# Patient Record
Sex: Female | Born: 1935
Health system: Southern US, Community
[De-identification: ages and names within clinical notes are randomized; demographics above are authoritative.]

## PROBLEM LIST (undated history)

## (undated) DIAGNOSIS — M810 Age-related osteoporosis without current pathological fracture: Secondary | ICD-10-CM

## (undated) DIAGNOSIS — E785 Hyperlipidemia, unspecified: Secondary | ICD-10-CM

## (undated) DIAGNOSIS — I10 Essential (primary) hypertension: Secondary | ICD-10-CM

## (undated) DIAGNOSIS — N189 Chronic kidney disease, unspecified: Secondary | ICD-10-CM

## (undated) DIAGNOSIS — C801 Malignant (primary) neoplasm, unspecified: Secondary | ICD-10-CM

## (undated) DIAGNOSIS — I890 Lymphedema, not elsewhere classified: Secondary | ICD-10-CM

## (undated) DIAGNOSIS — M4800 Spinal stenosis, site unspecified: Secondary | ICD-10-CM

## (undated) DIAGNOSIS — M109 Gout, unspecified: Secondary | ICD-10-CM

## (undated) DIAGNOSIS — E119 Type 2 diabetes mellitus without complications: Secondary | ICD-10-CM

## (undated) DIAGNOSIS — M889 Osteitis deformans of unspecified bone: Secondary | ICD-10-CM

## (undated) DIAGNOSIS — E669 Obesity, unspecified: Secondary | ICD-10-CM

## (undated) HISTORY — DX: Essential (primary) hypertension: I10

## (undated) HISTORY — DX: Chronic kidney disease, unspecified: N18.9

## (undated) HISTORY — DX: Osteitis deformans of unspecified bone: M88.9

## (undated) HISTORY — DX: Gout, unspecified: M10.9

## (undated) HISTORY — DX: Age-related osteoporosis without current pathological fracture: M81.0

## (undated) HISTORY — DX: Hyperlipidemia, unspecified: E78.5

## (undated) HISTORY — DX: Type 2 diabetes mellitus without complications: E11.9

## (undated) HISTORY — PX: ABDOMINAL HYSTERECTOMY: SHX81

## (undated) HISTORY — DX: Lymphedema, not elsewhere classified: I89.0

## (undated) HISTORY — PX: JOINT REPLACEMENT: SHX530

## (undated) HISTORY — DX: Spinal stenosis, site unspecified: M48.00

## (undated) HISTORY — DX: Malignant (primary) neoplasm, unspecified: C80.1

---

## 2006-07-08 ENCOUNTER — Encounter: Payer: Self-pay | Admitting: Internal Medicine

## 2006-07-12 ENCOUNTER — Emergency Department: Payer: Self-pay

## 2006-07-20 ENCOUNTER — Encounter: Payer: Self-pay | Admitting: Internal Medicine

## 2006-08-19 ENCOUNTER — Encounter: Payer: Self-pay | Admitting: Internal Medicine

## 2006-09-17 ENCOUNTER — Ambulatory Visit: Payer: Self-pay | Admitting: Internal Medicine

## 2007-03-18 ENCOUNTER — Ambulatory Visit: Payer: Self-pay | Admitting: Rheumatology

## 2007-09-01 ENCOUNTER — Ambulatory Visit: Payer: Self-pay | Admitting: Nurse Practitioner

## 2007-09-28 ENCOUNTER — Ambulatory Visit: Payer: Self-pay | Admitting: Internal Medicine

## 2008-05-11 ENCOUNTER — Ambulatory Visit: Payer: Self-pay | Admitting: Nurse Practitioner

## 2008-11-19 HISTORY — PX: MASTECTOMY: SHX3

## 2008-11-22 ENCOUNTER — Ambulatory Visit: Payer: Self-pay | Admitting: Nurse Practitioner

## 2008-12-08 ENCOUNTER — Ambulatory Visit: Payer: Self-pay | Admitting: Nurse Practitioner

## 2008-12-20 ENCOUNTER — Ambulatory Visit: Payer: Self-pay | Admitting: Oncology

## 2009-01-13 ENCOUNTER — Ambulatory Visit: Payer: Self-pay | Admitting: Oncology

## 2009-01-17 ENCOUNTER — Ambulatory Visit: Payer: Self-pay | Admitting: Oncology

## 2009-02-09 ENCOUNTER — Ambulatory Visit: Payer: Self-pay | Admitting: Surgery

## 2009-02-15 ENCOUNTER — Inpatient Hospital Stay: Payer: Self-pay | Admitting: Surgery

## 2009-02-20 ENCOUNTER — Emergency Department: Payer: Self-pay | Admitting: Emergency Medicine

## 2009-02-24 ENCOUNTER — Emergency Department: Payer: Self-pay | Admitting: Emergency Medicine

## 2009-04-19 ENCOUNTER — Ambulatory Visit: Payer: Self-pay | Admitting: Oncology

## 2009-04-29 ENCOUNTER — Ambulatory Visit: Payer: Self-pay | Admitting: Oncology

## 2009-05-19 ENCOUNTER — Ambulatory Visit: Payer: Self-pay | Admitting: Oncology

## 2009-06-17 ENCOUNTER — Ambulatory Visit: Payer: Self-pay | Admitting: Oncology

## 2009-06-28 ENCOUNTER — Ambulatory Visit: Payer: Self-pay | Admitting: Oncology

## 2009-08-19 ENCOUNTER — Ambulatory Visit: Payer: Self-pay | Admitting: Oncology

## 2009-09-16 ENCOUNTER — Ambulatory Visit: Payer: Self-pay | Admitting: Oncology

## 2009-09-19 ENCOUNTER — Ambulatory Visit: Payer: Self-pay | Admitting: Oncology

## 2009-11-19 ENCOUNTER — Ambulatory Visit: Payer: Self-pay | Admitting: Oncology

## 2009-12-12 ENCOUNTER — Ambulatory Visit: Payer: Self-pay | Admitting: Oncology

## 2009-12-16 ENCOUNTER — Ambulatory Visit: Payer: Self-pay | Admitting: Oncology

## 2009-12-20 ENCOUNTER — Ambulatory Visit: Payer: Self-pay | Admitting: Oncology

## 2010-01-23 ENCOUNTER — Ambulatory Visit: Payer: Self-pay | Admitting: Unknown Physician Specialty

## 2010-02-09 ENCOUNTER — Ambulatory Visit: Payer: Self-pay | Admitting: Pain Medicine

## 2010-02-17 ENCOUNTER — Ambulatory Visit: Payer: Self-pay | Admitting: Oncology

## 2010-03-17 ENCOUNTER — Ambulatory Visit: Payer: Self-pay | Admitting: Oncology

## 2010-03-19 ENCOUNTER — Ambulatory Visit: Payer: Self-pay | Admitting: Oncology

## 2010-05-19 ENCOUNTER — Ambulatory Visit: Payer: Self-pay | Admitting: Oncology

## 2010-06-16 ENCOUNTER — Ambulatory Visit: Payer: Self-pay | Admitting: Oncology

## 2010-06-19 ENCOUNTER — Ambulatory Visit: Payer: Self-pay | Admitting: Oncology

## 2010-08-18 ENCOUNTER — Ambulatory Visit: Payer: Self-pay | Admitting: Internal Medicine

## 2010-10-04 ENCOUNTER — Ambulatory Visit: Payer: Self-pay | Admitting: Family Medicine

## 2010-10-06 ENCOUNTER — Ambulatory Visit: Payer: Self-pay | Admitting: Oncology

## 2010-10-07 LAB — CANCER ANTIGEN 27.29: CA 27.29: 11.1 U/mL (ref 0.0–38.6)

## 2010-10-16 ENCOUNTER — Ambulatory Visit: Payer: Self-pay | Admitting: Family Medicine

## 2010-10-19 ENCOUNTER — Ambulatory Visit: Payer: Self-pay | Admitting: Oncology

## 2010-12-19 ENCOUNTER — Ambulatory Visit: Payer: Self-pay | Admitting: Oncology

## 2011-03-12 ENCOUNTER — Ambulatory Visit: Payer: Self-pay | Admitting: Internal Medicine

## 2011-03-20 ENCOUNTER — Ambulatory Visit: Payer: Self-pay | Admitting: Internal Medicine

## 2011-03-20 LAB — CANCER ANTIGEN 27.29: CA 27.29: 20.8 U/mL (ref 0.0–38.6)

## 2011-06-14 DIAGNOSIS — M159 Polyosteoarthritis, unspecified: Secondary | ICD-10-CM | POA: Insufficient documentation

## 2011-06-14 DIAGNOSIS — I890 Lymphedema, not elsewhere classified: Secondary | ICD-10-CM | POA: Insufficient documentation

## 2011-06-14 DIAGNOSIS — Z853 Personal history of malignant neoplasm of breast: Secondary | ICD-10-CM | POA: Insufficient documentation

## 2011-06-14 DIAGNOSIS — M810 Age-related osteoporosis without current pathological fracture: Secondary | ICD-10-CM | POA: Insufficient documentation

## 2011-06-14 DIAGNOSIS — M1990 Unspecified osteoarthritis, unspecified site: Secondary | ICD-10-CM | POA: Insufficient documentation

## 2011-10-08 ENCOUNTER — Ambulatory Visit: Payer: Self-pay | Admitting: Internal Medicine

## 2011-10-09 LAB — CANCER ANTIGEN 27.29: CA 27.29: 14.1 U/mL (ref 0.0–38.6)

## 2011-10-20 ENCOUNTER — Ambulatory Visit: Payer: Self-pay | Admitting: Internal Medicine

## 2011-12-12 ENCOUNTER — Ambulatory Visit: Payer: Self-pay | Admitting: Internal Medicine

## 2011-12-25 ENCOUNTER — Ambulatory Visit: Payer: Self-pay | Admitting: Oncology

## 2012-04-10 ENCOUNTER — Ambulatory Visit: Payer: Self-pay | Admitting: Internal Medicine

## 2012-04-10 LAB — CBC CANCER CENTER
Eosinophil %: 4.1 %
HCT: 36.1 % (ref 35.0–47.0)
HGB: 11.7 g/dL — ABNORMAL LOW (ref 12.0–16.0)
Lymphocyte #: 1.4 x10 3/mm (ref 1.0–3.6)
MCV: 84 fL (ref 80–100)
Monocyte #: 0.3 x10 3/mm (ref 0.2–0.9)
Neutrophil %: 54.6 %
RBC: 4.29 10*6/uL (ref 3.80–5.20)
RDW: 13.6 % (ref 11.5–14.5)

## 2012-04-10 LAB — COMPREHENSIVE METABOLIC PANEL
Anion Gap: 9 (ref 7–16)
Bilirubin,Total: 0.4 mg/dL (ref 0.2–1.0)
Calcium, Total: 9.3 mg/dL (ref 8.5–10.1)
Chloride: 108 mmol/L — ABNORMAL HIGH (ref 98–107)
Co2: 25 mmol/L (ref 21–32)
Creatinine: 1.58 mg/dL — ABNORMAL HIGH (ref 0.60–1.30)
EGFR (African American): 35 — ABNORMAL LOW
EGFR (Non-African Amer.): 30 — ABNORMAL LOW
Osmolality: 295 (ref 275–301)
Potassium: 4.9 mmol/L (ref 3.5–5.1)
SGOT(AST): 16 U/L (ref 15–37)
SGPT (ALT): 22 U/L
Total Protein: 6.8 g/dL (ref 6.4–8.2)

## 2012-04-11 LAB — CANCER ANTIGEN 27.29: CA 27.29: 19.1 U/mL (ref 0.0–38.6)

## 2012-04-19 ENCOUNTER — Ambulatory Visit: Payer: Self-pay | Admitting: Internal Medicine

## 2012-06-02 DIAGNOSIS — E785 Hyperlipidemia, unspecified: Secondary | ICD-10-CM | POA: Insufficient documentation

## 2012-06-02 DIAGNOSIS — I10 Essential (primary) hypertension: Secondary | ICD-10-CM | POA: Insufficient documentation

## 2012-06-02 DIAGNOSIS — E1169 Type 2 diabetes mellitus with other specified complication: Secondary | ICD-10-CM

## 2013-03-19 ENCOUNTER — Ambulatory Visit: Payer: Self-pay | Admitting: Family Medicine

## 2013-04-10 ENCOUNTER — Ambulatory Visit: Payer: Self-pay | Admitting: Family Medicine

## 2013-07-30 ENCOUNTER — Ambulatory Visit: Payer: Self-pay | Admitting: Family Medicine

## 2013-09-04 ENCOUNTER — Ambulatory Visit: Payer: Self-pay | Admitting: Family Medicine

## 2014-07-19 ENCOUNTER — Ambulatory Visit: Payer: Self-pay | Admitting: Nurse Practitioner

## 2014-07-19 LAB — CREATININE, SERUM
CREATININE: 1.35 mg/dL — AB (ref 0.60–1.30)
EGFR (African American): 42 — ABNORMAL LOW
GFR CALC NON AF AMER: 36 — AB

## 2014-07-29 ENCOUNTER — Ambulatory Visit: Payer: Self-pay | Admitting: Nurse Practitioner

## 2015-03-14 ENCOUNTER — Emergency Department
Admit: 2015-03-14 | Disposition: A | Discharge status: Home / Self Care | Payer: Self-pay | Admitting: Emergency Medicine

## 2015-03-14 LAB — CBC WITH DIFFERENTIAL/PLATELET
Basophil #: 0 10*3/uL (ref 0.0–0.1)
Basophil %: 0.7 %
Eosinophil #: 0.2 10*3/uL (ref 0.0–0.7)
Eosinophil %: 2.8 %
HCT: 39.6 % (ref 35.0–47.0)
HGB: 13 g/dL (ref 12.0–16.0)
Lymphocyte #: 2 10*3/uL (ref 1.0–3.6)
Lymphocyte %: 35.2 %
MCH: 26.7 pg (ref 26.0–34.0)
MCHC: 32.9 g/dL (ref 32.0–36.0)
MCV: 81 fL (ref 80–100)
MONO ABS: 0.4 x10 3/mm (ref 0.2–0.9)
MONOS PCT: 6.9 %
NEUTROS PCT: 54.4 %
Neutrophil #: 3 10*3/uL (ref 1.4–6.5)
PLATELETS: 158 10*3/uL (ref 150–440)
RBC: 4.86 10*6/uL (ref 3.80–5.20)
RDW: 13.3 % (ref 11.5–14.5)
WBC: 5.6 10*3/uL (ref 3.6–11.0)

## 2015-03-14 LAB — COMPREHENSIVE METABOLIC PANEL
ALK PHOS: 60 U/L
ALT: 12 U/L — AB
Albumin: 4.1 g/dL
Anion Gap: 6 — ABNORMAL LOW (ref 7–16)
BUN: 23 mg/dL — ABNORMAL HIGH
Bilirubin,Total: 0.3 mg/dL
CALCIUM: 9.3 mg/dL
CHLORIDE: 110 mmol/L
CO2: 26 mmol/L
CREATININE: 1.3 mg/dL — AB
EGFR (African American): 43 — ABNORMAL LOW
EGFR (Non-African Amer.): 37 — ABNORMAL LOW
GLUCOSE: 115 mg/dL — AB
Potassium: 4.2 mmol/L
SGOT(AST): 17 U/L
Sodium: 142 mmol/L
Total Protein: 7 g/dL

## 2015-03-14 LAB — PRO B NATRIURETIC PEPTIDE: B-TYPE NATIURETIC PEPTID: 98 pg/mL

## 2015-03-14 LAB — TROPONIN I

## 2015-07-05 ENCOUNTER — Encounter: Payer: Self-pay | Admitting: Physical Therapy

## 2015-07-05 ENCOUNTER — Ambulatory Visit: Payer: Medicare Other | Attending: Rheumatology | Admitting: Physical Therapy

## 2015-07-05 DIAGNOSIS — M545 Low back pain, unspecified: Secondary | ICD-10-CM

## 2015-07-05 DIAGNOSIS — M2560 Stiffness of unspecified joint, not elsewhere classified: Secondary | ICD-10-CM | POA: Diagnosis present

## 2015-07-05 DIAGNOSIS — M6281 Muscle weakness (generalized): Secondary | ICD-10-CM

## 2015-07-05 DIAGNOSIS — R262 Difficulty in walking, not elsewhere classified: Secondary | ICD-10-CM | POA: Insufficient documentation

## 2015-07-05 NOTE — Therapy (Signed)
[facility] [address] [place], [place], [address] Phone: [phone]   Fax:  [phone]  Physical Therapy Evaluation  Patient Details  Name: Heather Long MRN: [id number] Date of Birth: December 29, 1927 Referring Provider:  Emmaline Kluver.,*  Encounter Date: 07/05/2015      PT End of Session - 07/05/15 1348    Visit Number 1   Number of Visits 8   Date for PT Re-Evaluation 08/02/15   Authorization - Visit Number 1   Authorization - Number of Visits 10   PT Start Time 1017   PT Stop Time 1121   PT Time Calculation (min) 64 min   Equipment Utilized During Treatment Gait belt   Activity Tolerance Patient limited by pain;Patient limited by fatigue;Patient tolerated treatment well   Behavior During Therapy [facility] for tasks assessed/performed      Past Medical History  Diagnosis Date  . Diabetes mellitus without complication   . Hyperlipidemia   . Hypertension   . Chronic kidney disease   . Osteoporosis   . Paget's disease of bone   . Gout   . Spinal stenosis   . Cancer   . Lymphedema     Past Surgical History  Procedure Laterality Date  . Joint replacement  2000, 2006    There were no vitals filed for this visit.  Visit Diagnosis:  Nonspecific pain in the lumbar region  Joint stiffness of spine  Difficulty walking  Generalized muscle weakness      Subjective Assessment - 07/05/15 1319    Subjective Pt reports overall pain that feels like a "toothache" that has been constant for multiple years. Pt states that her low back hurts with lying down and standing.    Pertinent History B TKA 2000, 2006    Limitations Standing;Walking;House hold activities;Other (comment)  lying flat in bed   How long can you sit comfortably? in a soft chair/couch > 30 mins    How long can you stand comfortably? < 5 mins   How long can you walk comfortably? < 2 mins    Patient Stated Goals "stand upright" Walk with no AD. Knit  and crochet pain free.   Currently in Pain? Yes   Pain Score 3    Pain Location Generalized   Pain Descriptors / Indicators Aching   Pain Type Chronic pain   Pain Onset More than a month ago   Pain Frequency Constant            [facility] PT Assessment - 07/05/15 0001    Assessment   Medical Diagnosis Low back pain without sciatica   Onset Date/Surgical Date 11/19/14   Next MD Visit 08/15/15   Balance Screen   Has the patient fallen in the past 6 months Yes   Has the patient had a decrease in activity level because of a fear of falling?  Yes   Is the patient reluctant to leave their home because of a fear of falling?  Yes   [place] residence           PT Education - 07/05/15 1347    Education provided Yes   Education Details Straight leg raise, scapular retraction, pec stretch in supine, standing marching at the kitchen counter   Person(s) Educated Patient   Methods Explanation;Demonstration;Handout   Comprehension Verbalized understanding;Returned demonstration             PT Long Term Goals - 07/05/15 1402  PT LONG TERM GOAL #1   Title Pt will increase BERG balance score to > 48/56 in order to safely ambulate in her home with occasional use of SPC.   Baseline 07-16-15 38/56    Time 4   Period Weeks   Status New   PT LONG TERM GOAL #2   Title Pt will show a decrease in self-reported perceived impairments ODI to < 30% in order to return to ADLs.    Baseline 54% on Jul 16, 2015   Time 4   Period Weeks   Status New   PT LONG TERM GOAL #3   Title Pt I with HEP to increase B hip flexion to a 4/5 MMT in order to ambulate safely with a normalized gait pattern for community distances.    Baseline 3/5 MMT B on July 16, 2015   Time 4   Period Weeks   Status New   PT LONG TERM GOAL #4   Title Pt. will demonstrate proper upright standing posture/ body mechanics with cooking/household chores to improve overall mobility.   Baseline Poor  posture with forward head/ rounded shoulders   Time 4   Period Weeks   Status New            Plan - 2015-07-16 1349    Clinical Impression Statement Pt is a 81 y.o F with chronic low back pain. Pt reports generalized pain at a 3/10 with noted tenderness in the low back/SI joint area. Pt has palable muscle tenderness in low thoracic and lumbar region in sitting. Pt ROM: Lumbar Extension: 50% limited, Lumbar flexion: 10% limited, Lumbar rotation: L/R 50% limited, Lumbar lateral flexion 50% limited.  Pt MMT: B knee flexion and extension 4+/5. B hip flexion and dorsiflexion 3/5 MMT. Pt presents with generalized hypomobility of the lumbar spine and SI joint in sitting.  Pt presents with piriformis, hip flexors and hip adductor muscle tightness and discomfort with stretching. Pt ambulates with a 2-point gait pattern with a step to gait pattern with a SPC.  Pt has B decreased hip flexion, knee flexion with swing and hip extension with stance phases. Pt has increased forward head, rounded shoulders and forward flexed trunk with sitting and standing/walking activities. Pt is able to correct with verbal cues but unable to maintain corrections for extended periods of time. Pt's BERG balance score: 38/56. Pt will benefit from skilled short term PT to address balance, ROM, strength deficits to improve functional mobility in order to return to greater independence and safety with all functional mobility.   Pt will benefit from skilled therapeutic intervention in order to improve on the following deficits Abnormal gait;Decreased endurance;Hypomobility;Increased edema;Decreased activity tolerance;Obesity;Pain;Decreased mobility;Decreased balance;Difficulty walking;Decreased strength;Decreased range of motion;Decreased coordination;Decreased safety awareness;Improper body mechanics;Postural dysfunction   Rehab Potential Fair   PT Frequency 2x / week   PT Duration 4 weeks   PT Treatment/Interventions ADLs/Self Care  Home Management;Neuromuscular re-education;Aquatic Therapy;Moist Heat;Cryotherapy;Gait training;Patient/family education;Functional mobility training;Therapeutic activities;Therapeutic exercise;Balance training;Manual techniques;Passive range of motion   PT Next Visit Plan increase standing exercises, gait, strengthening, postural exercises    PT Home Exercise Plan SLR, scapular retraction, pec stretch, standing marching    Recommended Other Services aquatic therapy    Consulted and Agree with Plan of Care Patient          G-Codes - 07-16-2015 1641    Functional Assessment Tool Used Clinical judgment/ Berg/ ODI/ pain   Functional Limitation Changing and maintaining body position   Changing and Maintaining Body Position Current Status [id number]) At  least 40 percent but less than 60 percent impaired, limited or restricted   Changing and Maintaining Body Position Goal Status [id number]) At least 20 percent but less than 40 percent impaired, limited or restricted       Problem List There are no active problems to display for this patient.  Pura Spice, PT, DPT # [phone]   07/05/2015, 4:51 PM  [place] [facility] [address] [place], [place], [address] Phone: [phone]   Fax:  [phone]

## 2015-07-07 ENCOUNTER — Ambulatory Visit: Payer: Medicare Other | Admitting: Physical Therapy

## 2015-07-07 ENCOUNTER — Encounter: Payer: Self-pay | Admitting: Physical Therapy

## 2015-07-07 DIAGNOSIS — M2560 Stiffness of unspecified joint, not elsewhere classified: Secondary | ICD-10-CM

## 2015-07-07 DIAGNOSIS — R262 Difficulty in walking, not elsewhere classified: Secondary | ICD-10-CM

## 2015-07-07 DIAGNOSIS — M545 Low back pain, unspecified: Secondary | ICD-10-CM

## 2015-07-07 DIAGNOSIS — M6281 Muscle weakness (generalized): Secondary | ICD-10-CM

## 2015-07-07 NOTE — Therapy (Signed)
[facility] [address] [place], [place], [address] Phone: [phone]   Fax:  [phone]  Physical Therapy Treatment  Patient Details  Name: Heather Long MRN: [id number] Date of Birth: 03-22-1928 Referring Provider:  Emmaline Kluver.,*  Encounter Date: 07/07/2015      PT End of Session - 07/07/15 1551    Visit Number 2   Number of Visits 8   Date for PT Re-Evaluation 08/02/15   Authorization - Visit Number 2   Authorization - Number of Visits 10   PT Start Time [id number]   PT Stop Time 1546   PT Time Calculation (min) 31 min   Equipment Utilized During Treatment Gait belt   Activity Tolerance Patient limited by pain;Patient limited by fatigue;Patient tolerated treatment well   Behavior During Therapy [facility] for tasks assessed/performed      Past Medical History  Diagnosis Date  . Diabetes mellitus without complication   . Hyperlipidemia   . Hypertension   . Chronic kidney disease   . Osteoporosis   . Paget's disease of bone   . Gout   . Spinal stenosis   . Cancer   . Lymphedema     Past Surgical History  Procedure Laterality Date  . Joint replacement  2000, 2006    There were no vitals filed for this visit.  Visit Diagnosis:  Nonspecific pain in the lumbar region  Joint stiffness of spine  Difficulty walking  Generalized muscle weakness      Subjective Assessment - 07/07/15 1532    Subjective Pt reports soreness after exercises over the past couple of days. Pt confirms that it is muscle soreness not joint pain. Pt reports difficulty with straight leg raise.   Pertinent History B TKA 2000, 2006    Limitations Standing;Walking;House hold activities;Other (comment)   How long can you sit comfortably? in a soft chair/couch > 30 mins    How long can you stand comfortably? < 5 mins   How long can you walk comfortably? < 2 mins    Patient Stated Goals "stand upright" Walk with no AD. Knit and crochet  pain free.   Currently in Pain? No/denies         Lateral walking with hip flexion in // bars x 4 repetitions bilaterally (emphasis on hip flexion; pt required cues for hip flexion but did not require UE support)  5x sit to stand without use of UEs - 14.4 seconds   Mini squats with bilateral HHA in // bars x 15 repetitions (initially felt limited R knee pain, lessened with repetitions; pt reported feeling her quads working with this).  Attempted resisted gait with 2 black bands, patient felt uncomfortable and discontinued.   Blue foam pad static balance, heel raises x 12, quick marching on pad x 30 seconds (tolerated well, pt had postural sway but was able to correct independently).   Heel raises x 15        PT Education - 07/07/15 1551    Education provided Yes   Education Details pt educated to perform long arc quads in sitting position instead of SLR to decrease discomfort.    Person(s) Educated Patient   Methods Explanation;Demonstration   Comprehension Verbalized understanding;Returned demonstration             PT Long Term Goals - 07/05/15 1402    PT LONG TERM GOAL #1   Title Pt will increase BERG balance score to > 48/56 in order to  safely ambulate in her home with occasional use of SPC.   Baseline 07/05/15 38/56    Time 4   Period Weeks   Status New   PT LONG TERM GOAL #2   Title Pt will show a decrease in self-reported perceived impairments ODI to < 30% in order to return to ADLs.    Baseline 54% on 07/05/15   Time 4   Period Weeks   Status New   PT LONG TERM GOAL #3   Title Pt I with HEP to increase B hip flexion to a 4/5 MMT in order to ambulate safely with a normalized gait pattern for community distances.    Baseline 3/5 MMT B on 07/05/15   Time 4   Period Weeks   Status New   PT LONG TERM GOAL #4   Title Pt. will demonstrate proper upright standing posture/ body mechanics with cooking/household chores to improve overall mobility.   Baseline Poor  posture with forward head/ rounded shoulders   Time 4   Period Weeks   Status New               Plan - 07/07/15 1552    Clinical Impression Statement Pt progressing with strengthening; pt is able to perform mini-squats with minimal R knee discomfort. Pt required consistent verbal cues to have proper form and decrease her R knee pain. Pt performed tandem stance with discomfort to her R knee so it was discontinued. Pt is limited by fatigue and low activity tolerance  with standing activities and required frequent seated rest breaks. Pt feels most comfortable with marching and standing activites on the firm ground; pt is unsure and feels uneasy with balance activities on foarm surfaces or resisted gait.    Pt will benefit from skilled therapeutic intervention in order to improve on the following deficits Abnormal gait;Decreased endurance;Hypomobility;Increased edema;Decreased activity tolerance;Obesity;Pain;Decreased mobility;Decreased balance;Difficulty walking;Decreased strength;Decreased range of motion;Decreased coordination;Decreased safety awareness;Improper body mechanics;Postural dysfunction   Rehab Potential Fair   PT Frequency 2x / week   PT Duration 4 weeks   PT Treatment/Interventions ADLs/Self Care Home Management;Neuromuscular re-education;Aquatic Therapy;Moist Heat;Cryotherapy;Gait training;Patient/family education;Functional mobility training;Therapeutic activities;Therapeutic exercise;Balance training;Manual techniques;Passive range of motion   PT Next Visit Plan increase standing exercises, gait, strengthening, postural exercises    PT Home Exercise Plan same as before except for substituting LAQ for SLR    Recommended Other Services aquatic therapy, silver sneakers    Consulted and Agree with Plan of Care Patient        Problem List There are no active problems to display for this patient.   Kerman Passey, PT, DPT    07/07/2015, 3:57 PM  [facility] [address] [place], [place], [address] Phone: [phone]   Fax:  [phone]

## 2015-07-12 ENCOUNTER — Ambulatory Visit: Payer: Medicare Other | Admitting: Physical Therapy

## 2015-07-12 DIAGNOSIS — M545 Low back pain, unspecified: Secondary | ICD-10-CM

## 2015-07-12 DIAGNOSIS — M2560 Stiffness of unspecified joint, not elsewhere classified: Secondary | ICD-10-CM

## 2015-07-12 DIAGNOSIS — M6281 Muscle weakness (generalized): Secondary | ICD-10-CM

## 2015-07-12 DIAGNOSIS — R262 Difficulty in walking, not elsewhere classified: Secondary | ICD-10-CM

## 2015-07-12 NOTE — Therapy (Signed)
[place] [facility] [address] [place], [place], [address] Phone: [phone]   Fax:  [phone]  Physical Therapy Treatment  Patient Details  Name: Heather Long MRN: [id number] Date of Birth: 1928-09-21 Referring Provider:  Emmaline Kluver.,*  Encounter Date: 07/12/2015      PT End of Session - 07/12/15 1410    Visit Number 3   Number of Visits 8   Date for PT Re-Evaluation 08/02/15   Authorization - Visit Number 3   Authorization - Number of Visits 10   PT Start Time [id number]   PT Stop Time [id number]   PT Time Calculation (min) 51 min   Equipment Utilized During Treatment Gait belt   Activity Tolerance Patient limited by pain;Patient limited by fatigue;Patient tolerated treatment well   Behavior During Therapy [facility] for tasks assessed/performed      Past Medical History  Diagnosis Date  . Diabetes mellitus without complication   . Hyperlipidemia   . Hypertension   . Chronic kidney disease   . Osteoporosis   . Paget's disease of bone   . Gout   . Spinal stenosis   . Cancer   . Lymphedema     Past Surgical History  Procedure Laterality Date  . Joint replacement  2000, 2006    There were no vitals filed for this visit.  Visit Diagnosis:  Nonspecific pain in the lumbar region  Joint stiffness of spine  Difficulty walking  Generalized muscle weakness      Subjective Assessment - 07/12/15 1408    Subjective Pt. c/o generalized muscle soreness after last tx.  Pt. reports being pretty sedentary around house.  No falls or LOB reported.    Pertinent History B TKA 2000, 2006    Limitations Standing;Walking;House hold activities;Other (comment)   How long can you sit comfortably? in a soft chair/couch > 30 mins    How long can you stand comfortably? < 5 mins   How long can you walk comfortably? < 2 mins    Patient Stated Goals "stand upright" Walk with no AD. Knit and crochet pain free.   Currently in Pain?  No/denies       OBJECTIVE:  There.ex.: Nustep L5 B UE/LE 5 min. (no increase pain).  Standing hip flexion/ knee flexion/ recip. Step touches (toe/heel touch) 20x each with minimal //-bars assist.  Sit to standing with no UE assist 8x (good technique).  Discussed HEP/ R hip flexion and daily walking program.  Neuro mm: walking forward/backwards/ lateral walking with no UE assist (mirror feedback) 2x each than a seated rest break required.  Tandem stance with min. To no UE assist (fearful of LOB with no UE assist).  Walking in clinic/ outside/ curbs working on posture, consistent step pattern, 2-point gait with SPC and wt. Shifting.      Pt response for medical necessity:  Generalized fatigue and R hip muscle weakness.  Slow and controlled gait pattern but benefits from use of SPC.           PT Long Term Goals - 07/05/15 1402    PT LONG TERM GOAL #1   Title Pt will increase BERG balance score to > 48/56 in order to safely ambulate in her home with occasional use of SPC.   Baseline 07/05/15 38/56    Time 4   Period Weeks   Status New   PT LONG TERM GOAL #2   Title Pt will show a decrease in self-reported perceived  impairments ODI to < 30% in order to return to ADLs.    Baseline 54% on 07/05/15   Time 4   Period Weeks   Status New   PT LONG TERM GOAL #3   Title Pt I with HEP to increase B hip flexion to a 4/5 MMT in order to ambulate safely with a normalized gait pattern for community distances.    Baseline 3/5 MMT B on 07/05/15   Time 4   Period Weeks   Status New   PT LONG TERM GOAL #4   Title Pt. will demonstrate proper upright standing posture/ body mechanics with cooking/household chores to improve overall mobility.   Baseline Poor posture with forward head/ rounded shoulders   Time 4   Period Weeks   Status New            Plan - 07/12/15 1410    Clinical Impression Statement Pt. requires several short seated rest breaks after standing ther.ex. due to generalized muscle  fatigue/ shortness of breath.  R hip/knee muscle weakness and pt. prefers to have UE assist with any standing ther.ex involving R hip flexion/ movement.  Pt. felt great on Nustep and enjoyed the ease of LE/UE movement with back support.  Pt. able to ambulate with consistent recip. gait pattern and use of SPC in clinic/hallway/down 1 curb outside with moderate cuing to improve posture/ head position.     Pt will benefit from skilled therapeutic intervention in order to improve on the following deficits Abnormal gait;Decreased endurance;Hypomobility;Increased edema;Decreased activity tolerance;Obesity;Pain;Decreased mobility;Decreased balance;Difficulty walking;Decreased strength;Decreased range of motion;Decreased coordination;Decreased safety awareness;Improper body mechanics;Postural dysfunction   Rehab Potential Fair   PT Frequency 2x / week   PT Duration 4 weeks   PT Treatment/Interventions ADLs/Self Care Home Management;Neuromuscular re-education;Aquatic Therapy;Moist Heat;Cryotherapy;Gait training;Patient/family education;Functional mobility training;Therapeutic activities;Therapeutic exercise;Balance training;Manual techniques;Passive range of motion   PT Next Visit Plan increase standing exercises, gait, strengthening, postural exercises    PT Home Exercise Plan same as before except for substituting LAQ for SLR    Recommended Other Services aquatic therapy, Silver Sneakers   Consulted and Agree with Plan of Care Patient        Problem List There are no active problems to display for this patient.  Pura Spice, PT, DPT # [phone]   07/12/2015, 2:30 PM  [place] [facility] [address] [place], [place], [address] Phone: [phone]   Fax:  [phone]

## 2015-07-14 ENCOUNTER — Encounter: Payer: Self-pay | Admitting: Physical Therapy

## 2015-07-14 ENCOUNTER — Ambulatory Visit: Payer: Medicare Other | Admitting: Physical Therapy

## 2015-07-14 DIAGNOSIS — M545 Low back pain, unspecified: Secondary | ICD-10-CM

## 2015-07-14 DIAGNOSIS — M6281 Muscle weakness (generalized): Secondary | ICD-10-CM

## 2015-07-14 DIAGNOSIS — M2560 Stiffness of unspecified joint, not elsewhere classified: Secondary | ICD-10-CM

## 2015-07-14 DIAGNOSIS — R262 Difficulty in walking, not elsewhere classified: Secondary | ICD-10-CM

## 2015-07-14 NOTE — Therapy (Signed)
[facility] [address] [place], [place], [address] Phone: [phone]   Fax:  [phone]  Physical Therapy Treatment  Patient Details  Name: Heather Long MRN: [id number] Date of Birth: [birth year] Referring Provider:  Emmaline Kluver.,*  Encounter Date: 07/14/2015      PT End of Session - 07/14/15 1404    Visit Number 4   Number of Visits 8   Date for PT Re-Evaluation 08/02/15   Authorization - Visit Number 4   Authorization - Number of Visits 10   PT Start Time [id number]   PT Stop Time 1131   PT Time Calculation (min) 52 min   Equipment Utilized During Treatment Gait belt   Activity Tolerance Patient tolerated treatment well;Patient limited by fatigue   Behavior During Therapy [facility] for tasks assessed/performed      Past Medical History  Diagnosis Date  . Diabetes mellitus without complication   . Hyperlipidemia   . Hypertension   . Chronic kidney disease   . Osteoporosis   . Paget's disease of bone   . Gout   . Spinal stenosis   . Cancer   . Lymphedema     Past Surgical History  Procedure Laterality Date  . Joint replacement  2000, 2006    There were no vitals filed for this visit.  Visit Diagnosis:  Nonspecific pain in the lumbar region  Joint stiffness of spine  Difficulty walking  Generalized muscle weakness      Subjective Assessment - 07/14/15 1403    Subjective Pt c/o of generalized pain/soreness but focused on lumbar pain with standing activities. Pt reports lifting her head up makes her pain worse and harder to walk.    Limitations Lifting;Standing;Walking   Patient Stated Goals stand "upright"/walk with no AD/knit and crochet painfree.   Currently in Pain? Yes   Pain Score 5    Pain Location Back   Pain Type Chronic pain   Pain Onset More than a month ago   Pain Frequency Intermittent        OBJECTIVE: There.ex.: Nustep L6 B UE/LE 7 min. (no increase pain). Standing hip  flexion/ knee flexion/ recip. Step touches (toe/heel touch) 20x each with minimal //-bars assist. Sit to standing with no UE assist 8x (good technique). Discussed HEP/ R hip flexion and daily walking program.  R LE step ups/overs in //-bars. Neuro mm: walking forward/backwards/ lateral walking with no UE assist (mirror feedback) 2x each than a seated rest break required. Tandem stance with min. To no UE assist (fearful of LOB with no UE assist). Functional reaching in //-bars with cones.  Walking in clinic/ outside/ curbs working on posture, consistent step pattern, 2-point gait with SPC and wt. Shifting.    Pt response for medical necessity: Generalized fatigue and R hip muscle weakness. Slow and controlled gait pattern but benefits from use of SPC.          PT Long Term Goals - 07/05/15 1402    PT LONG TERM GOAL #1   Title Pt will increase BERG balance score to > 48/56 in order to safely ambulate in her home with occasional use of SPC.   Baseline 07/05/15 38/56    Time 4   Period Weeks   Status New   PT LONG TERM GOAL #2   Title Pt will show a decrease in self-reported perceived impairments ODI to < 30% in order to return to ADLs.    Baseline 54% on 07/05/15  Time 4   Period Weeks   Status New   PT LONG TERM GOAL #3   Title Pt I with HEP to increase B hip flexion to a 4/5 MMT in order to ambulate safely with a normalized gait pattern for community distances.    Baseline 3/5 MMT B on 07/05/15   Time 4   Period Weeks   Status New   PT LONG TERM GOAL #4   Title Pt. will demonstrate proper upright standing posture/ body mechanics with cooking/household chores to improve overall mobility.   Baseline Poor posture with forward head/ rounded shoulders   Time 4   Period Weeks   Status New               Plan - 07/14/15 1405    Clinical Impression Statement Pt able to perform more standing tasks without rest breaks when in the // bars today compared to last  session. Pt TUG time with SPC: 18 seconds, without SPC 21 seconds. Pt required rest breaks with gait activities. Pt reports overall feeling better in terms of soreness after moving during PT tx session. Pt was limited by R knee and hip weakness and pain during standing activities. Pt was educated on her walking with SPC at all times due to increased gait speed and safety with the cane. Pt ambulates with flexed posture and decrease step length with her SPC. Pt continues to benefit from skilled PT to address balance, gait and strengthening deficits to improve independence and safety with functional mobility.   Pt will benefit from skilled therapeutic intervention in order to improve on the following deficits Abnormal gait;Decreased range of motion;Difficulty walking;Decreased safety awareness;Decreased balance;Decreased mobility;Decreased strength;Hypomobility;Impaired flexibility;Improper body mechanics;Pain;Obesity   Rehab Potential Fair   PT Frequency 2x / week   PT Duration 4 weeks   PT Treatment/Interventions ADLs/Self Care Home Management;Cryotherapy;Moist Heat;Neuromuscular re-education;Passive range of motion;Patient/family education;Gait training;Stair training;Functional mobility training;Therapeutic activities;Therapeutic exercise;Manual techniques   PT Next Visit Plan continue to challenge pt's balance outside of BOS/DGI/ gait with fluid pattern   PT Home Exercise Plan continue with current HEP--progress next visit.    Recommended Other Services Silver Sneakers    Consulted and Agree with Plan of Care Patient        Problem List There are no active problems to display for this patient.  Pura Spice, PT, DPT # [phone]   07/15/2015, 10:58 AM  [facility] [address] [place], [place], [address] Phone: [phone]   Fax:  [phone]

## 2015-07-19 ENCOUNTER — Ambulatory Visit: Payer: Medicare Other | Admitting: Physical Therapy

## 2015-07-21 ENCOUNTER — Ambulatory Visit: Payer: Medicare Other | Attending: Rheumatology | Admitting: Physical Therapy

## 2015-07-21 ENCOUNTER — Encounter: Payer: Self-pay | Admitting: Physical Therapy

## 2015-07-21 DIAGNOSIS — R262 Difficulty in walking, not elsewhere classified: Secondary | ICD-10-CM | POA: Insufficient documentation

## 2015-07-21 DIAGNOSIS — M6281 Muscle weakness (generalized): Secondary | ICD-10-CM | POA: Diagnosis present

## 2015-07-21 DIAGNOSIS — M2560 Stiffness of unspecified joint, not elsewhere classified: Secondary | ICD-10-CM | POA: Diagnosis present

## 2015-07-21 DIAGNOSIS — M545 Low back pain, unspecified: Secondary | ICD-10-CM

## 2015-07-21 NOTE — Therapy (Signed)
[facility] [address]. [place], [place], [address] Phone: [phone]   Fax:  [phone]  Physical Therapy Treatment  Patient Details  Name: Heather Long MRN: [id number] Date of Birth: [birth year] Referring Provider:  Emmaline Kluver.,*  Encounter Date: 07/21/2015      PT End of Session - 07/21/15 1257    Visit Number 5   Number of Visits 8   Date for PT Re-Evaluation 08/02/15   Authorization - Visit Number 5   Authorization - Number of Visits 10   PT Start Time 1216   PT Stop Time 1304   PT Time Calculation (min) 48 min   Equipment Utilized During Treatment Gait belt   Activity Tolerance Patient tolerated treatment well;Patient limited by fatigue   Behavior During Therapy [facility] for tasks assessed/performed      Past Medical History  Diagnosis Date  . Diabetes mellitus without complication   . Hyperlipidemia   . Hypertension   . Chronic kidney disease   . Osteoporosis   . Paget's disease of bone   . Gout   . Spinal stenosis   . Cancer   . Lymphedema     Past Surgical History  Procedure Laterality Date  . Joint replacement  2000, 2006    There were no vitals filed for this visit.  Visit Diagnosis:  Nonspecific pain in the lumbar region  Joint stiffness of spine  Difficulty walking  Generalized muscle weakness      Subjective Assessment - 07/21/15 1255    Subjective Pt. reports GI issues are better today.  Pt. reports being sore after last PT tx./ ex. program and continues to have difficulty sleeping at night.  Pt. states she prefers supine ex. program vs. standing program.     Pertinent History B TKA 2000, 2006    Limitations Lifting;Standing;Walking   How long can you sit comfortably? in a soft chair/couch > 30 mins    How long can you stand comfortably? < 5 mins   How long can you walk comfortably? < 2 mins    Patient Stated Goals stand "upright"/walk with no AD/knit and crochet painfree.   Currently in Pain? Yes   Pain Score 1    Pain Location Back      Sit to stand 5x: 11.47 sec.      OBJECTIVE: There.ex.: Nustep L6 B UE/LE 8 min. (no increase pain/ warm-up and fatigue reported). Standing hip flexion/ knee flexion/ recip. Step touches (toe/heel touch) 20x each with minimal R UE assist/SPC use. Sit to standing with no UE assist 5x (good technique)- improved time. Discussed HEP/ R hip flexion and daily walking program. Neuro mm:  Obstacle course in hallway (step ups/overs/ maneuvering cones/ Airex)- 4x.  Tandem gait in //-bars (forward)- cuing to improve posture with mirror feedback.  Walking in clinic/ outside/ curbs working on posture, consistent step pattern, 2-point gait with SPC and wt. Shifting.    Pt response for medical necessity: Generalized fatigue and R hip muscle weakness. Slow and controlled gait pattern but benefits from use of SPC. Progressing with LE strengthening/ there.ex. Program with skilled PT services.           PT Long Term Goals - 07/05/15 1402    PT LONG TERM GOAL #1   Title Pt will increase BERG balance score to > 48/56 in order to safely ambulate in her home with occasional use of SPC.   Baseline 07/05/15 38/56    Time 4  Period Weeks   Status New   PT LONG TERM GOAL #2   Title Pt will show a decrease in self-reported perceived impairments ODI to < 30% in order to return to ADLs.    Baseline 54% on 07/05/15   Time 4   Period Weeks   Status New   PT LONG TERM GOAL #3   Title Pt I with HEP to increase B hip flexion to a 4/5 MMT in order to ambulate safely with a normalized gait pattern for community distances.    Baseline 3/5 MMT B on 07/05/15   Time 4   Period Weeks   Status New   PT LONG TERM GOAL #4   Title Pt. will demonstrate proper upright standing posture/ body mechanics with cooking/household chores to improve overall mobility.   Baseline Poor posture with forward head/ rounded shoulders   Time 4   Period  Weeks   Status New             Plan - 07/21/15 1258    Clinical Impression Statement Pt. able to stand from chair with no armrest with no UE support safely with proper technique.  Pt. continues to benefit from use of SPC with 2-point gait pattern on uneven terrain/ outside surfaces/ obstacle.  Pt. prefers not to use SPC in home because she feels safe in environment.  Standing activity tolerance is 2 min. with ther.ex.     Pt will benefit from skilled therapeutic intervention in order to improve on the following deficits Abnormal gait;Decreased range of motion;Difficulty walking;Decreased safety awareness;Decreased balance;Decreased mobility;Decreased strength;Hypomobility;Impaired flexibility;Improper body mechanics;Pain;Obesity   Rehab Potential Fair   PT Frequency 2x / week   PT Duration 4 weeks   PT Treatment/Interventions ADLs/Self Care Home Management;Cryotherapy;Moist Heat;Neuromuscular re-education;Passive range of motion;Patient/family education;Gait training;Stair training;Functional mobility training;Therapeutic activities;Therapeutic exercise;Manual techniques   PT Next Visit Plan continue to challenge pt's balance outside of BOS/DGI/ gait with fluid pattern   PT Home Exercise Plan continue with current HEP--progress next visit.    Recommended Other Services Silver Sneakers   Consulted and Agree with Plan of Care Patient        Problem List There are no active problems to display for this patient.  Pura Spice, PT, DPT # [phone]   07/21/2015, 1:05 PM  [facility] [address] [place], [place], [address] Phone: [phone]   Fax:  [phone]

## 2015-07-27 ENCOUNTER — Encounter: Payer: Self-pay | Admitting: Physical Therapy

## 2015-07-27 ENCOUNTER — Ambulatory Visit: Payer: Medicare Other | Admitting: Physical Therapy

## 2015-07-27 DIAGNOSIS — M545 Low back pain, unspecified: Secondary | ICD-10-CM

## 2015-07-27 DIAGNOSIS — R262 Difficulty in walking, not elsewhere classified: Secondary | ICD-10-CM

## 2015-07-27 DIAGNOSIS — M2560 Stiffness of unspecified joint, not elsewhere classified: Secondary | ICD-10-CM

## 2015-07-27 DIAGNOSIS — M6281 Muscle weakness (generalized): Secondary | ICD-10-CM

## 2015-07-27 NOTE — Therapy (Signed)
[facility] [address] [place], [place], [address] Phone: [phone]   Fax:  [phone]  Physical Therapy Treatment  Patient Details  Name: Heather Long MRN: [id number] Date of Birth: [birth year] Referring Provider:  Emmaline Kluver.,*  Encounter Date: 07/27/2015      PT End of Session - 07/27/15 1521    Visit Number 6   Number of Visits 8   Date for PT Re-Evaluation 08/02/15   Authorization - Visit Number 6   Authorization - Number of Visits 10   PT Start Time 1113   PT Stop Time 1200   PT Time Calculation (min) 47 min   Equipment Utilized During Treatment Gait belt   Activity Tolerance Patient tolerated treatment well;Patient limited by fatigue   Behavior During Therapy [facility] for tasks assessed/performed      Past Medical History  Diagnosis Date  . Diabetes mellitus without complication   . Hyperlipidemia   . Hypertension   . Chronic kidney disease   . Osteoporosis   . Paget's disease of bone   . Gout   . Spinal stenosis   . Cancer   . Lymphedema     Past Surgical History  Procedure Laterality Date  . Joint replacement  2000, 2006    There were no vitals filed for this visit.  Visit Diagnosis:  Joint stiffness of spine  Nonspecific pain in the lumbar region  Difficulty walking  Generalized muscle weakness      Subjective Assessment - 07/27/15 1518    Subjective Pt reports doing her HEP and feeling like PT is helping her. Pt states that she is sore and has a general toothache all over her body.    Pertinent History B TKA 2000, 2006    Limitations Lifting;Standing;Walking   How long can you sit comfortably? in a soft chair/couch > 30 mins    How long can you stand comfortably? < 5 mins   How long can you walk comfortably? < 2 mins    Patient Stated Goals stand "upright"/walk with no AD/knit and crochet painfree.   Currently in Pain? Yes   Pain Score 3    Pain Location --  general body:  more low back/right sided pain    Pain Descriptors / Indicators Aching;Dull   Pain Type Chronic pain   Pain Onset More than a month ago   Pain Frequency Intermittent      OBJECTIVE: There.ex.: Nustep L6 B UE/LE 10 min. (no increase pain/ warm-up but fatigue reported).   Step overs forwards and sideways 20x each with minimal R UE assist/moderate verbal cuing for increased R hip flexion. Standing walking marching to facilitate R hip flexion. On blue Airex, standing balance, weight shifts and heel raises.  Reviewed HEP.  Gait training: Walking in clinic/ outside/ curbs/grass working on posture, consistent step pattern, 2-point gait with SPC and wt. Shifting.    Pt response for medical necessity: Generalized fatigue and R hip muscle weakness. Slow and controlled gait pattern but benefits from use of SPC. Progressing with LE strengthening/ there.ex. Program with skilled PT services.          PT Long Term Goals - 07/05/15 1402    PT LONG TERM GOAL #1   Title Pt will increase BERG balance score to > 48/56 in order to safely ambulate in her home with occasional use of SPC.   Baseline 07/05/15 38/56    Time 4   Period Weeks   Status  New   PT LONG TERM GOAL #2   Title Pt will show a decrease in self-reported perceived impairments ODI to < 30% in order to return to ADLs.    Baseline 54% on 07/05/15   Time 4   Period Weeks   Status New   PT LONG TERM GOAL #3   Title Pt I with HEP to increase B hip flexion to a 4/5 MMT in order to ambulate safely with a normalized gait pattern for community distances.    Baseline 3/5 MMT B on 07/05/15   Time 4   Period Weeks   Status New   PT LONG TERM GOAL #4   Title Pt. will demonstrate proper upright standing posture/ body mechanics with cooking/household chores to improve overall mobility.   Baseline Poor posture with forward head/ rounded shoulders   Time 4   Period Weeks   Status New      Problem List There are no active problems  to display for this patient.  Pura Spice, PT, DPT # [phone]   07/27/2015, 6:00 PM  [facility] [address] [place], [place], [address] Phone: [phone]   Fax:  [phone]

## 2015-07-29 ENCOUNTER — Ambulatory Visit: Payer: Medicare Other | Admitting: Physical Therapy

## 2015-07-29 ENCOUNTER — Encounter: Payer: Self-pay | Admitting: Physical Therapy

## 2015-07-29 DIAGNOSIS — M2560 Stiffness of unspecified joint, not elsewhere classified: Secondary | ICD-10-CM

## 2015-07-29 DIAGNOSIS — M545 Low back pain, unspecified: Secondary | ICD-10-CM

## 2015-07-29 DIAGNOSIS — R262 Difficulty in walking, not elsewhere classified: Secondary | ICD-10-CM

## 2015-07-29 DIAGNOSIS — M6281 Muscle weakness (generalized): Secondary | ICD-10-CM

## 2015-07-29 NOTE — Therapy (Signed)
[facility] [address] [place], [place], [address] Phone: [phone]   Fax:  [phone]  Physical Therapy Treatment  Patient Details  Name: Heather Long MRN: [id number] Date of Birth: 12/31/1927 Referring Provider:  Emmaline Kluver.,*  Encounter Date: 07/29/2015      PT End of Session - 07/29/15 1657    Visit Number 7   Number of Visits 8   Date for PT Re-Evaluation 08/02/15   Authorization - Visit Number 7   Authorization - Number of Visits 10   PT Start Time 7106   PT Stop Time 1559   PT Time Calculation (min) 43 min   Equipment Utilized During Treatment Gait belt   Activity Tolerance Patient tolerated treatment well;Patient limited by fatigue   Behavior During Therapy [facility] for tasks assessed/performed      Past Medical History  Diagnosis Date  . Diabetes mellitus without complication   . Hyperlipidemia   . Hypertension   . Chronic kidney disease   . Osteoporosis   . Paget's disease of bone   . Gout   . Spinal stenosis   . Cancer   . Lymphedema     Past Surgical History  Procedure Laterality Date  . Joint replacement  2000, 2006    There were no vitals filed for this visit.  Visit Diagnosis:  Joint stiffness of spine  Nonspecific pain in the lumbar region  Difficulty walking  Generalized muscle weakness      Subjective Assessment - 07/29/15 1518    Subjective Pt reports sleeping well last night and not feeling the burning pain in her legs today. Pt reports feeling a lot looser and that PT has done her a world of good. Pt continues to report an all over toothache.    Pertinent History B TKA 2000, 2006    Limitations Lifting;Standing;Walking   How long can you sit comfortably? in a soft chair/couch > 30 mins    How long can you stand comfortably? < 5 mins   How long can you walk comfortably? < 2 mins    Patient Stated Goals stand "upright"/walk with no AD/knit and crochet painfree.   Currently in Pain? Yes   Pain Score 5    Pain Location Back   Pain Type Chronic pain   Pain Onset More than a month ago   Pain Frequency Intermittent      OBJECTIVE: There ex: Nustep L6 10 mins (warm up no charge).  Reviewed HEP/ see handouts. BERG: 52/56. Gait/ balance training with modified independence with SPC on curbs/carpet/asphalt/sidewalk.  Discharge from PT at this time and instructed to contact PT if any further issues persist.    Pt response to Tx for medical necessity: Decreased fatigue, increased activity tolerance (from 2 to 5 mins) overall ease with ADLs and functional mobility. Pt discharged from PT at this time.         PT Education - 07/29/15 1656    Education provided Yes   Education Details Pt provided handout for continued HEP. Explained how pt can progress HEP at home. Given yellow laxtex free TB.    Person(s) Educated Patient   Methods Explanation;Demonstration;Handout   Comprehension Returned demonstration;Verbalized understanding             PT Long Term Goals - 07/29/15 1659    PT LONG TERM GOAL #1   Title Pt will increase BERG balance score to > 48/56 in order to safely ambulate in her home  with occasional use of SPC.   Baseline 52/56 on August 03, 2023   Time 4   Period Weeks   Status Achieved   PT LONG TERM GOAL #2   Title Pt will show a decrease in self-reported perceived impairments ODI to < 30% in order to return to ADLs.    Baseline 54% on 07/05/15   Time 4   Period Weeks   Status Unable to assess   PT LONG TERM GOAL #3   Title Pt I with HEP to increase B hip flexion to a 4/5 MMT in order to ambulate safely with a normalized gait pattern for community distances.    Baseline 4+/5 on 03-Aug-2023   Time 4   Period Weeks   Status Achieved   PT LONG TERM GOAL #4   Title Pt. will demonstrate proper upright standing posture/ body mechanics with cooking/household chores to improve overall mobility.   Baseline Poor posture with forward head/ rounded shoulders    Time 4   Period Weeks   Status Partially Met           Plan - 2015/08/03 1658    Clinical Impression Statement Pt progresses well with short term skilled PT. Pt is now able to stand for 5 mins without shortness of breath or complaints of tiredness. Pt is able to ambulate with fluid, consistent reciprocal 2 point gait pattern with modified independence with SPC. BER: 52/56 difficulty with tandem and single leg stance but no difficulty with semi tandem. Pt educated on importance of staying active and told to call PT if any problems arise. At this time, pt is discharged from skilled PT.    Pt will benefit from skilled therapeutic intervention in order to improve on the following deficits Abnormal gait;Decreased range of motion;Difficulty walking;Decreased safety awareness;Decreased balance;Decreased mobility;Decreased strength;Hypomobility;Impaired flexibility;Improper body mechanics;Pain;Obesity   Rehab Potential Fair   PT Frequency 2x / week   PT Duration 4 weeks   PT Treatment/Interventions ADLs/Self Care Home Management;Cryotherapy;Moist Heat;Neuromuscular re-education;Passive range of motion;Patient/family education;Gait training;Stair training;Functional mobility training;Therapeutic activities;Therapeutic exercise;Manual techniques   PT Next Visit Plan Discharge at this time.   PT Home Exercise Plan see handout    Recommended Other Services [facility] with [facility]/taking [facility] trips through the senior center    [facility] and Agree with Plan of Care Patient          G-Codes - 2015-08-03 1723    Functional Assessment Tool Used Clinical judgment/ Berg/ ODI/ pain   Functional Limitation Changing and maintaining body position   Changing and Maintaining Body Position Current Status (P9470) At least 20 percent but less than 40 percent impaired, limited or restricted   Changing and Maintaining Body Position Goal Status (R6151) At least 20 percent but less than 40 percent impaired,  limited or restricted   Changing and Maintaining Body Position Discharge Status (I3437) At least 20 percent but less than 40 percent impaired, limited or restricted      Problem List There are no active problems to display for this patient.   Lavone Neri, SPT  Aug 03, 2015, 5:24 PM  [place] [facility] [address] [place], [place], [address] Phone: [phone]   Fax:  [phone]

## 2016-03-14 ENCOUNTER — Ambulatory Visit
Admission: EM | Admit: 2016-03-14 | Discharge: 2016-03-14 | Disposition: A | Discharge status: Home / Self Care | Payer: Medicare Other | Attending: Family Medicine | Admitting: Family Medicine

## 2016-03-14 ENCOUNTER — Ambulatory Visit (INDEPENDENT_AMBULATORY_CARE_PROVIDER_SITE_OTHER): Payer: Medicare Other

## 2016-03-14 DIAGNOSIS — S9031XA Contusion of right foot, initial encounter: Secondary | ICD-10-CM

## 2016-03-14 HISTORY — DX: Obesity, unspecified: E66.9

## 2016-03-14 MED ORDER — ACETAMINOPHEN-CODEINE #3 300-30 MG PO TABS: 1.0000 | ORAL_TABLET | Freq: Three times a day (TID) | ORAL | Status: DC | PRN

## 2016-03-14 NOTE — ED Provider Notes (Signed)
[facility]  ____________________________________________  Time seen: Approximately 0945 AM  I have reviewed the triage vital signs and the nursing notes.   HISTORY  Chief Complaint Foot Pain    HPI Heather Long is a 82 y.o. female  presents with a complaint of right foot pain for the last 4 days. Patient reports that she was in her kitchen and reaching for a coffee mug. Patient states that when she grabbed a coffee mug she did not have a good grip on it and accidentally dropped it. Patient reports the coughing month and landed on her right foot. Denies fall. Denies head injury or loss consciousness. Denies other pain or injury. Patient reports that she has had continued pain to the same area with a coffee mug fell since the injury occurred. Denies pain radiation. Denies numbness or tingling sensation.  Patient reports that she does have a history of gout to her right great toe and states that she just had a flareup of her gout a few days prior to her dropping the coffee mug on her foot. Patient states that her gout pain has fully resolved. Patient reports that at rest she has no pain. Patient states the pain is only with walking. Patient states that she uses a cane at baseline due to chronic low back pain. Patient reports pain to her right foot when walking.  Denies chest pain, shortness of breath, changes in her chronic low back pain, weakness, dysuria, dizziness, or other extremity pain. Patient reports that she has chronic bilateral lower extremity swelling that has been unchanged. Patient reports that in the past she has taken Tylenol with Codeine and tolerated that very well. Patient reports that she does live home alone but has family nearby. Son at bedside.  PCP:  Gaet[facility] Net   Past Medical History  Diagnosis Date  . Diabetes mellitus without complication ([place])   . Hyperlipidemia   . Hypertension   . Chronic kidney disease   . Osteoporosis   . Paget's disease of  bone   . Gout   . Spinal stenosis   . Cancer ([place])   . Lymphedema   . Obesity     There are no active problems to display for this patient.   Past Surgical History  Procedure Laterality Date  . Joint replacement  2000, 2006  . Abdominal hysterectomy      Current Outpatient Rx  Name  Route  Sig  Dispense  Refill  . chlorthalidone (HYGROTON) 25 MG tablet   Oral   Take 25 mg by mouth daily.         Marland Kitchen co-enzyme Q-10 30 MG capsule   Oral   Take 30 mg by mouth 3 (three) times daily.         . Vitamin D, Ergocalciferol, (DRISDOL) 50000 UNITS CAPS capsule   Oral   Take 50,000 Units by mouth every 7 (seven) days.         Marland Kitchen acetaminophen (TYLENOL) 325 MG tablet   Oral   Take 650 mg by mouth every 6 (six) hours as needed.         .           . enalapril (VASOTEC) 2.5 MG tablet   Oral   Take 2.5 mg by mouth daily.           Allergies Review of patient's allergies indicates no known allergies.  History reviewed. No pertinent family history.  Social History Social History  Substance Use Topics  .  Smoking status: Former Research scientist (life sciences)  . Smokeless tobacco: None  . Alcohol Use: No    Review of Systems Constitutional: No fever/chills Eyes: No visual changes. ENT: No sore throat. Cardiovascular: Denies chest pain. Respiratory: Denies shortness of breath. Gastrointestinal: No abdominal pain.  No nausea, no vomiting.  No diarrhea.  No constipation. Genitourinary: Negative for dysuria. Musculoskeletal: Negative for back pain. Right foot pain. Skin: Negative for rash. Neurological: Negative for headaches, focal weakness or numbness.  10-point ROS otherwise negative.  ____________________________________________   PHYSICAL EXAM:  VITAL SIGNS: ED Triage Vitals  Enc Vitals Group     BP 03/14/16 0920 155/51 mmHg     Pulse Rate 03/14/16 0920 95     Resp 03/14/16 0920 20     Temp 03/14/16 0920 97.4 F (36.3 C)     Temp Source 03/14/16 0920 Oral     SpO2  03/14/16 0920 96 %     Weight 03/14/16 0920 232 lb (105.235 kg)     Height 03/14/16 0920 4\' 11"  (1.499 m)     Head Cir --      Peak Flow --      Pain Score --      Pain Loc --      Pain Edu? --      Excl. in [place]? --     Constitutional: Alert and oriented. Well appearing and in no acute distress. Eyes: Conjunctivae are normal. PERRL. EOMI. Head: Atraumatic.  Nose: No congestion/rhinnorhea.  Mouth/Throat: Mucous membranes are moist.   Neck: No stridor.  No cervical spine tenderness to palpation. Cardiovascular: Normal rate, regular rhythm. Grossly normal heart sounds.  Good peripheral circulation. Respiratory: Normal respiratory effort.  No retractions. Lungs CTAB. Gastrointestinal: Soft and nontender.  Musculoskeletal: No lower or upper extremity tenderness. Mild bilateral distal lower extremity edema, per patient chronic and unchanged. No calf tenderness bilaterally.. Bilateral pedal pulses equal and easily palpated.  Except: Right dorsal foot along the distal third and fourth metatarsal mild swelling, mild to moderate tenderness to direct palpation, full range of motion to right foot, bilateral pedal pulses equal and easily palpable, capillary refill less than 2 seconds to all right foot distal toes, sensation intact to right foot, skin intact, right lower extremity otherwise nontender. No pain with plantar flexion or dorsiflexion to right leg. Neurologic:  Normal speech and language. No gross focal neurologic deficits are appreciated. Skin:  Skin is warm, dry and intact. No rash noted. Psychiatric: Mood and affect are normal. Speech and behavior are normal.  ____________________________________________   LABS (all labs ordered are listed, but only abnormal results are displayed)  Labs Reviewed - No data to display  RADIOLOGY  EXAM: RIGHT FOOT COMPLETE - 3+ VIEW  COMPARISON: 04/10/2013  FINDINGS: Soft tissue swelling along the dorsum of the foot. No underlying bony  abnormality. No fracture, subluxation or dislocation. Mild degenerative changes at the first MTP joint. Small plantar calcaneal spur.  IMPRESSION: No acute bony abnormality.   Electronically Signed By: Rolm Baptise M.D. On: 03/14/2016 09:54      I, Marylene Land, personally discussed these images and results by phone with the on-call radiologist and used this discussion as part of my medical decision making.   ____________________________________________   PROCEDURES  Procedure(s) performed:  Standard walker and postoperative shoe applied by RN. Neurovascular intact. Patient ambulatory with steady gait. __________________   INITIAL IMPRESSION / ASSESSMENT AND PLAN / ED COURSE  Pertinent labs & imaging results that were available during my care of the  patient were reviewed by me and considered in my medical decision making (see chart for details).  Very well-appearing patient. No acute distress. Presents for the complaints of right foot pain. Patient reports 4 days ago she actually dropped a coffee mug on her foot. Reports the coffee mug did not break. No break in skin. Point tenderness to distal third and fourth metatarsal. No calf tenderness. Bilateral pedal pulses equal and easily palpable. Will evaluate by x-ray.  Right foot x-ray negative per radiologist. Will treat supportively with walker and postoperative shoe. Prescription for Tylenol with codeine given and direct patient to take half a tablet as needed for pain. Encouraged rest and ice. Encouraged patient to follow-up with primary care physician as well as orthopedic for continued pain. Son at bedside and also verbalizes understanding of plan.  Discussed follow up with Primary care physician this week. Discussed follow up and return parameters including no resolution or any worsening concerns. Patient verbalized understanding and agreed to plan.   ____________________________________________   FINAL CLINICAL  IMPRESSION(S) / ED DIAGNOSES  Final diagnoses:  Foot contusion, right, initial encounter      Note: This dictation was prepared with Dragon dictation along with smaller phrase technology. Any transcriptional errors that result from this process are unintentional.    Marylene Land, NP 03/14/16 1155

## 2016-03-14 NOTE — Discharge Instructions (Signed)
Take medication as prescribed. Rest. Apply ice. Use walker and post operative shoe as long as pain continues.   Follow up with orthopedic this week as needed for continued pain.   Follow up with your primary care physician this week as needed. Return to Urgent care for new or worsening concerns.    Foot Contusion A foot contusion is a deep bruise to the foot. Contusions are the result of an injury that caused bleeding under the skin. The contusion may turn blue, purple, or yellow. Minor injuries will give you a painless contusion, but more severe contusions may stay painful and swollen for a few weeks. CAUSES  A foot contusion comes from a direct blow to that area, such as a heavy object falling on the foot. SYMPTOMS   Swelling of the foot.  Discoloration of the foot.  Tenderness or soreness of the foot. DIAGNOSIS  You will have a physical exam and will be asked about your history. You may need an X-ray of your foot to look for a broken bone (fracture).  TREATMENT  An elastic wrap may be recommended to support your foot. Resting, elevating, and applying cold compresses to your foot are often the best treatments for a foot contusion. Over-the-counter medicines may also be recommended for pain control. HOME CARE INSTRUCTIONS   Put ice on the injured area.  Put ice in a plastic bag.  Place a towel between your skin and the bag.  Leave the ice on for 15-20 minutes, 03-04 times a day.  Only take over-the-counter or prescription medicines for pain, discomfort, or fever as directed by your caregiver.  If told, use an elastic wrap as directed. This can help reduce swelling. You may remove the wrap for sleeping, showering, and bathing. If your toes become numb, cold, or blue, take the wrap off and reapply it more loosely.  Elevate your foot with pillows to reduce swelling.  Try to avoid standing or walking while the foot is painful. Do not resume use until instructed by your caregiver.  Then, begin use gradually. If pain develops, decrease use. Gradually increase activities that do not cause discomfort until you have normal use of your foot.  See your caregiver as directed. It is very important to keep all follow-up appointments in order to avoid any lasting problems with your foot, including long-term (chronic) pain. SEEK IMMEDIATE MEDICAL CARE IF:   You have increased redness, swelling, or pain in your foot.  Your swelling or pain is not relieved with medicines.  You have loss of feeling in your foot or are unable to move your toes.  Your foot turns cold or blue.  You have pain when you move your toes.  Your foot becomes warm to the touch.  Your contusion does not improve in 2 days. MAKE SURE YOU:   Understand these instructions.  Will watch your condition.  Will get help right away if you are not doing well or get worse.   This information is not intended to replace advice given to you by your health care provider. Make sure you discuss any questions you have with your health care provider.   Document Released: 08/27/2006 Document Revised: 05/06/2012 Document Reviewed: 07/12/2015 Elsevier Interactive Patient Education [employer].

## 2016-05-01 ENCOUNTER — Encounter: Payer: Self-pay | Admitting: *Deleted

## 2016-05-01 ENCOUNTER — Ambulatory Visit (INDEPENDENT_AMBULATORY_CARE_PROVIDER_SITE_OTHER): Admit: 2016-05-01 | Discharge: 2016-05-01 | Disposition: A | Discharge status: Home / Self Care | Payer: Medicare Other

## 2016-05-01 ENCOUNTER — Ambulatory Visit
Admission: EM | Admit: 2016-05-01 | Discharge: 2016-05-01 | Disposition: A | Discharge status: Home / Self Care | Payer: Medicare Other | Attending: Internal Medicine | Admitting: Internal Medicine

## 2016-05-01 DIAGNOSIS — L089 Local infection of the skin and subcutaneous tissue, unspecified: Secondary | ICD-10-CM

## 2016-05-01 DIAGNOSIS — M7989 Other specified soft tissue disorders: Secondary | ICD-10-CM

## 2016-05-01 MED ORDER — CEPHALEXIN 500 MG PO CAPS: 500.0000 mg | ORAL_CAPSULE | Freq: Four times a day (QID) | ORAL | Status: DC

## 2016-05-01 MED ORDER — TETANUS-DIPHTHERIA TOXOIDS TD 5-2 LFU IM INJ
0.5000 mL | INJECTION | Freq: Once | INTRAMUSCULAR | Status: AC
  Administered 2016-05-01: 0.5 mL via INTRAMUSCULAR

## 2016-05-01 MED ORDER — MUPIROCIN 2 % EX OINT: TOPICAL_OINTMENT | CUTANEOUS | Status: DC

## 2016-05-01 NOTE — ED Notes (Addendum)
Patient has visible swelling in her left lower leg. Patient reports that her cat scratched her on her feet and the back of her left leg. Patient states that her leg is very itchy.

## 2016-05-01 NOTE — ED Provider Notes (Signed)
[facility]  ____________________________________________  Time seen: Approximately 12:56 PM  I have reviewed the triage vital signs and the nursing notes.   HISTORY  Chief Complaint Leg Swelling   HPI Heather Long is a 82 y.o. female  patient reports that she does have some mild pain left lower extremity but states it feels like a tightness from swelling. Patient reports that she has chronic lymphedema and chronic lower extremity swelling but reports left lower is slightly more so swollen than normal. Patient does report approximate 2 or 3 days ago she was sitting in a chair with her legs propped up and reports that her cat jumped on the chair and accidentally scratched her legs. Patient reports that she is concerned that the scratch might be getting infected causing the swelling. Reports unsure of last tetanus immunization.  Denies drainage from cat scratched area. Reports has remained active. Denies fall. Denies fevers. Reports otherwise feeling well. Denies chest pain, shortness of breath, weakness, dysuria, neck pain, back pain or dizziness.  Reports unsure of last tetanus immunization.   PCP: [facility]onk reports has a primary care appointment tomorrow afternoon.   Past Medical History  Diagnosis Date  . Diabetes mellitus without complication ([name])   . Hyperlipidemia   . Hypertension   . Chronic kidney disease   . Osteoporosis   . Paget's disease of bone   . Gout   . Spinal stenosis   . Cancer ([name])   . Lymphedema   . Obesity     There are no active problems to display for this patient.   Past Surgical History  Procedure Laterality Date  . Joint replacement  2000, 2006  . Abdominal hysterectomy      Current Outpatient Rx  Name  Route  Sig  Dispense  Refill  . co-enzyme Q-10 30 MG capsule   Oral   Take 30 mg by mouth 3 (three) times daily.         . insulin glargine (LANTUS) 100 UNIT/ML injection   Subcutaneous   Inject into the skin daily.          . Vitamin D, Ergocalciferol, (DRISDOL) 50000 UNITS CAPS capsule   Oral   Take 50,000 Units by mouth every 7 (seven) days.         Marland Kitchen acetaminophen (TYLENOL) 325 MG tablet   Oral   Take 650 mg by mouth every 6 (six) hours as needed.         Marland Kitchen acetaminophen-codeine (TYLENOL #3) 300-30 MG tablet   Oral   Take 1 tablet by mouth every 8 (eight) hours as needed for moderate pain or severe pain (Do not take other pain medication or drive or operate machinery while taking as can cause drowsiness.).   10 tablet   0   . chlorthalidone (HYGROTON) 25 MG tablet   Oral   Take 25 mg by mouth daily.         . enalapril (VASOTEC) 2.5 MG tablet   Oral   Take 2.5 mg by mouth daily.           Allergies Review of patient's allergies indicates no known allergies.   pertinent family history Father: CAD Brother: CAD Mother: DM  Social History Social History  Substance Use Topics  . Smoking status: Former Research scientist (life sciences)  . Smokeless tobacco: None  . Alcohol Use: No    Review of Systems Constitutional: No fever/chills Eyes: No visual changes. ENT: No sore throat. Cardiovascular: Denies chest pain. Respiratory: Denies shortness  of breath. Gastrointestinal: No abdominal pain.  No nausea, no vomiting.  No diarrhea.  No constipation. Genitourinary: Negative for dysuria. Musculoskeletal: Negative for back pain. Skin: As above.  Neurological: Negative for headaches, focal weakness or numbness.  10-point ROS otherwise negative.  ____________________________________________   PHYSICAL EXAM:  VITAL SIGNS: ED Triage Vitals  Enc Vitals Group     BP 05/01/16 1209 185/65 mmHg     Pulse Rate 05/01/16 1209 88     Resp 05/01/16 1209 16     Temp 05/01/16 1209 98.1 F (36.7 C)     Temp Source 05/01/16 1209 Oral     SpO2 05/01/16 1209 97 %     Weight 05/01/16 1209 232 lb (105.235 kg)     Height 05/01/16 1209 4\' 11"  (1.499 m)     Head Cir --      Peak Flow --      Pain Score 05/01/16  1222 0     Pain Loc --      Pain Edu? --      Excl. in [place]? --     Constitutional: Alert and oriented. Well appearing and in no acute distress. Eyes: Conjunctivae are normal. PERRL. EOMI. Head: Atraumatic.  Ears: normal external appearance bilaterally.   Nose: No congestion/rhinnorhea.  Mouth/Throat: Mucous membranes are moist.  Oropharynx non-erythematous. Neck: No stridor.  No cervical spine tenderness to palpation. Hematological/Lymphatic/Immunilogical: No cervical lymphadenopathy. Cardiovascular: Normal rate, regular rhythm. Grossly normal heart sounds.  Good peripheral circulation. Respiratory: Normal respiratory effort.  No retractions. Lungs CTAB. Gastrointestinal: Soft and nontender. No distention. Normal Bowel sounds.  No abdominal bruits. No CVA tenderness. Musculoskeletal: No lower or upper extremity tenderness. Bilateral pedal pulses equal and easily palpated. See skin below. Neurologic:  Normal speech and language. No gross focal neurologic deficits are appreciated. No gait instability. Ambulatory with steady gait. Skin:  Skin is warm, dry and intact. No rash noted. Except: Left lower extremity anterior shin with small superficial abrasion noted, posterior lower left extremity two linear superficial abrasions with mild to moderate surrounding erythema and mild tenderness to palpation, no fractures, no induration, no exudate. Bilateral lower extremity is measured. Left calf 47 centimeters in diameter, right calf 47 cm in diameter, left lower leg 37.5 cm and right lower leg 36 cm in diameter.Bilateral pedal pulses equal and easily palpated. Psychiatric: Mood and affect are normal. Speech and behavior are normal.  ____________________________________________   LABS (all labs ordered are listed, but only abnormal results are displayed)  Labs Reviewed - No data to display ____________________________________________  RADIOLOGY  US Venous Img Lower Unilateral Left  05/01/2016   CLINICAL DATA:  One day history of left lower extremity edema EXAM: LEFT LOWER EXTREMITY VENOUS DUPLEX ULTRASOUND TECHNIQUE: Gray-scale sonography with graded compression, as well as color Doppler and duplex ultrasound were performed to evaluate the left lower extremity deep venous system from the level of the common femoral vein and including the common femoral, femoral, profunda femoral, popliteal and calf veins including the posterior tibial, peroneal and gastrocnemius veins when visible. The superficial great saphenous vein was also interrogated. Spectral Doppler was utilized to evaluate flow at rest and with distal augmentation maneuvers in the common femoral, femoral and popliteal veins. COMPARISON:  July 12, 2006 FINDINGS: Contralateral Common Femoral Vein: Respiratory phasicity is normal and symmetric with the symptomatic side. No evidence of thrombus. Normal compressibility. Common Femoral Vein: No evidence of thrombus. Normal compressibility, respiratory phasicity and response to augmentation. Saphenofemoral Junction: No evidence of thrombus.  Normal compressibility and flow on color Doppler imaging. Profunda Femoral Vein: No evidence of thrombus. Normal compressibility and flow on color Doppler imaging. Femoral Vein: No evidence of thrombus. Normal compressibility, respiratory phasicity and response to augmentation. Popliteal Vein: No evidence of thrombus. Normal compressibility, respiratory phasicity and response to augmentation. Calf Veins: No evidence of thrombus. Normal compressibility and flow on color Doppler imaging. Superficial Great Saphenous Vein: No evidence of thrombus. Normal compressibility and flow on color Doppler imaging. Venous Reflux:  None. Other Findings:  None. IMPRESSION: No evidence of left lower extremity deep venous thrombosis. Right common femoral vein also patent. Electronically Signed   By: Lowella Grip III M.D.   On: 05/01/2016 14:49    ____________________________________________    INITIAL IMPRESSION / ASSESSMENT AND PLAN / ED COURSE  Pertinent labs & imaging results that were available during my care of the patient were reviewed by me and considered in my medical decision making (see chart for details).   Very well-appearing patient. No acute distress. Presents for the complaints of left lower leg swelling. Patient expresses concern that this onset was after her cat accidentally scratched her left lower leg. Denies fall or other trauma. Reports has continued to ambulate. Left lower extremity with superficial abrasions noted with some surrounding erythema. Left lower extremity swelling noted, see above differences measured.. Suspect swelling related to injury and early skin infection, discussed with patient will evaluate by ultrasound to exclude deep venous thrombosis. Patient verbalized understanding and agree to this plan. Tetanus immunization updated.  Ultrasound reviewed, per radiologist no evidence of left lower extremity deep venous thrombosis. Discussed this with patient. Suspect swelling or redness due to skin injury and early infection. Will treat with oral cephalexin and topical Bactroban. Patient has PCP follow-up tomorrow. Encourage elevation, cleaning and monitoring. Discussed indication, risks and benefits of medications with patient.  Discussed follow up with Primary care physician this week. Discussed follow up and return parameters including no resolution or any worsening concerns. Patient verbalized understanding and agreed to plan.   ____________________________________________   FINAL CLINICAL IMPRESSION(S) / ED DIAGNOSES  Final diagnoses:  Skin infection  Left leg swelling     Discharge Medication List as of 05/01/2016  3:06 PM    START taking these medications   Details  cephALEXin (KEFLEX) 500 MG capsule Take 1 capsule (500 mg total) by mouth 4 (four) times daily., Starting 05/01/2016, Until  Discontinued, Normal    mupirocin ointment (BACTROBAN) 2 % Apply three times a day for 5 days., Normal        Note: This dictation was prepared with Dragon dictation along with smaller phrase technology. Any transcriptional errors that result from this process are unintentional.       Marylene Land, NP 05/01/16 1600

## 2016-05-01 NOTE — Discharge Instructions (Signed)
Take medication as prescribed. Rest. Elevate legs. Keep clean and apply topical antibiotic as prescribed.   Follow up with your primary care physician tomorrow as scheduled.  Return to Urgent care for new or worsening concerns.    Cellulitis Cellulitis is an infection of the skin and the tissue beneath it. The infected area is usually red and tender. Cellulitis occurs most often in the arms and lower legs.  CAUSES  Cellulitis is caused by bacteria that enter the skin through cracks or cuts in the skin. The most common types of bacteria that cause cellulitis are staphylococci and streptococci. SIGNS AND SYMPTOMS   Redness and warmth.  Swelling.  Tenderness or pain.  Fever. DIAGNOSIS  Your health care provider can usually determine what is wrong based on a physical exam. Blood tests may also be done. TREATMENT  Treatment usually involves taking an antibiotic medicine. HOME CARE INSTRUCTIONS   Take your antibiotic medicine as directed by your health care provider. Finish the antibiotic even if you start to feel better.  Keep the infected arm or leg elevated to reduce swelling.  Apply a warm cloth to the affected area up to 4 times per day to relieve pain.  Take medicines only as directed by your health care provider.  Keep all follow-up visits as directed by your health care provider. SEEK MEDICAL CARE IF:   You notice red streaks coming from the infected area.  Your red area gets larger or turns dark in color.  Your bone or joint underneath the infected area becomes painful after the skin has healed.  Your infection returns in the same area or another area.  You notice a swollen bump in the infected area.  You develop new symptoms.  You have a fever. SEEK IMMEDIATE MEDICAL CARE IF:   You feel very sleepy.  You develop vomiting or diarrhea.  You have a general ill feeling (malaise) with muscle aches and pains.   This information is not intended to replace advice  given to you by your health care provider. Make sure you discuss any questions you have with your health care provider.   Document Released: 08/15/2005 Document Revised: 07/27/2015 Document Reviewed: 01/21/2012 Elsevier Interactive Patient Education 2016 Else[employer]  Edema Edema is an abnormal buildup of fluids in your bodytissues. Edema is somewhatdependent on gravity to pull the fluid to the lowest place in your body. That makes the condition more common in the legs and thighs (lower extremities). Painless swelling of the feet and ankles is common and becomes more likely as you get older. It is also common in looser tissues, like around your eyes.  When the affected area is squeezed, the fluid may move out of that spot and leave a dent for a few moments. This dent is called pitting.  CAUSES  There are many possible causes of edema. Eating too much salt and being on your feet or sitting for a long time can cause edema in your legs and ankles. Hot weather may make edema worse. Common medical causes of edema include:  Heart failure.  Liver disease.  Kidney disease.  Weak blood vessels in your legs.  Cancer.  An injury.  Pregnancy.  Some medications.  Obesity. SYMPTOMS  Edema is usually painless.Your skin may look swollen or shiny.  DIAGNOSIS  Your health care provider may be able to diagnose edema by asking about your medical history and doing a physical exam. You may need to have tests such as X-rays, an electrocardiogram, or  blood tests to check for medical conditions that may cause edema.  TREATMENT  Edema treatment depends on the cause. If you have heart, liver, or kidney disease, you need the treatment appropriate for these conditions. General treatment may include:  Elevation of the affected body part above the level of your heart.  Compression of the affected body part. Pressure from elastic bandages or support stockings squeezes the tissues and forces fluid back  into the blood vessels. This keeps fluid from entering the tissues.  Restriction of fluid and salt intake.  Use of a water pill (diuretic). These medications are appropriate only for some types of edema. They pull fluid out of your body and make you urinate more often. This gets rid of fluid and reduces swelling, but diuretics can have side effects. Only use diuretics as directed by your health care provider. HOME CARE INSTRUCTIONS   Keep the affected body part above the level of your heart when you are lying down.   Do not sit still or stand for prolonged periods.   Do not put anything directly under your knees when lying down.  Do not wear constricting clothing or garters on your upper legs.   Exercise your legs to work the fluid back into your blood vessels. This may help the swelling go down.   Wear elastic bandages or support stockings to reduce ankle swelling as directed by your health care provider.   Eat a low-salt diet to reduce fluid if your health care provider recommends it.   Only take medicines as directed by your health care provider. SEEK MEDICAL CARE IF:   Your edema is not responding to treatment.  You have heart, liver, or kidney disease and notice symptoms of edema.  You have edema in your legs that does not improve after elevating them.   You have sudden and unexplained weight gain. SEEK IMMEDIATE MEDICAL CARE IF:   You develop shortness of breath or chest pain.   You cannot breathe when you lie down.  You develop pain, redness, or warmth in the swollen areas.   You have heart, liver, or kidney disease and suddenly get edema.  You have a fever and your symptoms suddenly get worse. MAKE SURE YOU:   Understand these instructions.  Will watch your condition.  Will get help right away if you are not doing well or get worse.   This information is not intended to replace advice given to you by your health care provider. Make sure you discuss  any questions you have with your health care provider.   Document Released: 11/05/2005 Document Revised: 11/26/2014 Document Reviewed: 08/28/2013 Elsevier Interactive Patient Education [employer].

## 2016-05-02 ENCOUNTER — Ambulatory Visit (INDEPENDENT_AMBULATORY_CARE_PROVIDER_SITE_OTHER): Payer: Medicare Other | Admitting: Family Medicine

## 2016-05-02 ENCOUNTER — Ambulatory Visit: Payer: Self-pay | Admitting: Family Medicine

## 2016-05-02 ENCOUNTER — Encounter: Payer: Self-pay | Admitting: Family Medicine

## 2016-05-02 VITALS — BP 140/80 | HR 64 | Ht 59.0 in | Wt 236.0 lb

## 2016-05-02 DIAGNOSIS — I10 Essential (primary) hypertension: Secondary | ICD-10-CM

## 2016-05-02 DIAGNOSIS — E1121 Type 2 diabetes mellitus with diabetic nephropathy: Secondary | ICD-10-CM | POA: Diagnosis not present

## 2016-05-02 DIAGNOSIS — N183 Chronic kidney disease, stage 3 unspecified: Secondary | ICD-10-CM | POA: Insufficient documentation

## 2016-05-02 DIAGNOSIS — I129 Hypertensive chronic kidney disease with stage 1 through stage 4 chronic kidney disease, or unspecified chronic kidney disease: Secondary | ICD-10-CM

## 2016-05-02 DIAGNOSIS — E119 Type 2 diabetes mellitus without complications: Secondary | ICD-10-CM | POA: Insufficient documentation

## 2016-05-02 DIAGNOSIS — M48061 Spinal stenosis, lumbar region without neurogenic claudication: Secondary | ICD-10-CM

## 2016-05-02 DIAGNOSIS — Z23 Encounter for immunization: Secondary | ICD-10-CM | POA: Diagnosis not present

## 2016-05-02 DIAGNOSIS — E1122 Type 2 diabetes mellitus with diabetic chronic kidney disease: Secondary | ICD-10-CM | POA: Insufficient documentation

## 2016-05-02 DIAGNOSIS — M4806 Spinal stenosis, lumbar region: Secondary | ICD-10-CM | POA: Diagnosis not present

## 2016-05-02 DIAGNOSIS — Z794 Long term (current) use of insulin: Secondary | ICD-10-CM | POA: Diagnosis not present

## 2016-05-02 DIAGNOSIS — E559 Vitamin D deficiency, unspecified: Secondary | ICD-10-CM

## 2016-05-02 DIAGNOSIS — R600 Localized edema: Secondary | ICD-10-CM | POA: Diagnosis not present

## 2016-05-02 DIAGNOSIS — N184 Chronic kidney disease, stage 4 (severe): Secondary | ICD-10-CM

## 2016-05-02 MED ORDER — LOSARTAN POTASSIUM 25 MG PO TABS: 25.0000 mg | ORAL_TABLET | Freq: Every day | ORAL | Status: DC

## 2016-05-03 LAB — CBC
HEMATOCRIT: 36.6 % (ref 34.0–46.6)
HEMOGLOBIN: 12.1 g/dL (ref 11.1–15.9)
MCH: 26.9 pg (ref 26.6–33.0)
MCHC: 33.1 g/dL (ref 31.5–35.7)
MCV: 82 fL (ref 79–97)
Platelets: 168 10*3/uL (ref 150–379)
RBC: 4.49 x10E6/uL (ref 3.77–5.28)
RDW: 15.7 % — ABNORMAL HIGH (ref 12.3–15.4)
WBC: 6 10*3/uL (ref 3.4–10.8)

## 2016-05-03 LAB — COMPREHENSIVE METABOLIC PANEL
ALT: 10 IU/L (ref 0–32)
AST: 11 IU/L (ref 0–40)
Albumin/Globulin Ratio: 2 (ref 1.2–2.2)
Albumin: 4.3 g/dL (ref 3.5–4.7)
Alkaline Phosphatase: 63 IU/L (ref 39–117)
BILIRUBIN TOTAL: 0.3 mg/dL (ref 0.0–1.2)
BUN/Creatinine Ratio: 15 (ref 12–28)
BUN: 19 mg/dL (ref 8–27)
CALCIUM: 9.3 mg/dL (ref 8.7–10.3)
CHLORIDE: 107 mmol/L — AB (ref 96–106)
CO2: 20 mmol/L (ref 18–29)
CREATININE: 1.31 mg/dL — AB (ref 0.57–1.00)
GFR calc non Af Amer: 37 mL/min/{1.73_m2} — ABNORMAL LOW (ref >59)
GFR, EST AFRICAN AMERICAN: 43 mL/min/{1.73_m2} — AB (ref >59)
GLUCOSE: 85 mg/dL (ref 65–99)
Globulin, Total: 2.1 g/dL (ref 1.5–4.5)
Potassium: 4.1 mmol/L (ref 3.5–5.2)
Sodium: 144 mmol/L (ref 134–144)
TOTAL PROTEIN: 6.4 g/dL (ref 6.0–8.5)

## 2016-05-03 LAB — LIPID PANEL
CHOL/HDL RATIO: 3.6 ratio (ref 0.0–4.4)
Cholesterol, Total: 196 mg/dL (ref 100–199)
HDL: 54 mg/dL (ref >39)
LDL CALC: 104 mg/dL — AB (ref 0–99)
TRIGLYCERIDES: 189 mg/dL — AB (ref 0–149)
VLDL Cholesterol Cal: 38 mg/dL (ref 5–40)

## 2016-05-03 LAB — MICROALBUMIN / CREATININE URINE RATIO
Creatinine, Urine: 108.9 mg/dL
MICROALB/CREAT RATIO: 219.7 mg/g creat — ABNORMAL HIGH (ref 0.0–30.0)
MICROALBUM., U, RANDOM: 239.2 ug/mL

## 2016-05-03 LAB — SEDIMENTATION RATE: SED RATE: 36 mm/h (ref 0–40)

## 2016-05-03 LAB — HEMOGLOBIN A1C
ESTIMATED AVERAGE GLUCOSE: 160 mg/dL
Hgb A1c MFr Bld: 7.2 % — ABNORMAL HIGH (ref 4.8–5.6)

## 2016-05-03 LAB — VITAMIN D 25 HYDROXY (VIT D DEFICIENCY, FRACTURES): VIT D 25 HYDROXY: 18.3 ng/mL — AB (ref 30.0–100.0)

## 2016-05-03 LAB — VITAMIN B12: Vitamin B-12: 146 pg/mL — ABNORMAL LOW (ref 211–946)

## 2016-05-03 LAB — TSH: TSH: 1.79 u[IU]/mL (ref 0.450–4.500)

## 2016-05-04 NOTE — Progress Notes (Signed)
Date:  05/02/2016   Name:  Heather Long   DOB:  [birth year]   MRN:  [id number]  PCP:  Adline Potter, MD    Chief Complaint: Establish Care and Hypertension   History of Present Illness:  This is a 82 y.o. female seen for initial visit. Hx T2DM on Lantus daily, also takes vit D supp and coQ10. Was on BP meds but stopped as didn't think still needed. C/o chronic back and leg pain, saw ortho with dx DDD and R radiculopathy (rheum suspected spinal stenosis), told to use Tylenol and started on gabapentin but declined to take due to potential se's. Ibuprofen helped in past but taken off due to CKD. Saw nephro who said nothing wrong. Has BLE edema which improves overnight. Hx breast ca s/p L mastectomy, some residual LUE lymphedema. Sees eye MD q14m Gets accupuncture for gout. Weight going up because can't exercise. S/p TKR in 2000 and 2006. Had tetanus imm yest, never had pneumo imms. Father died MI, mother breast ca, sister throat ca, sister blood ca, brothers have had MI's.  Review of Systems:  Review of Systems  Constitutional: Negative for fever and fatigue.  Respiratory: Negative for cough and shortness of breath.   Cardiovascular: Negative for chest pain.  Endocrine: Negative for polyuria.  Genitourinary: Negative for difficulty urinating.  Neurological: Negative for syncope and light-headedness.    Patient Active Problem List   Diagnosis Date Noted  . Obesity, Class III, BMI 40-49.9 (morbid obesity) ([facility] 05/02/2016  . CKD (chronic kidney disease) stage 3, GFR 30-59 ml/min 05/02/2016  . Controlled type 2 diabetes mellitus with diabetic nephropathy ([facility] 05/02/2016  . Vitamin D deficiency 05/02/2016  . Lower extremity edema 05/02/2016  . Spinal stenosis of lumbar region 05/02/2016    Prior to Admission medications   Medication Sig Start Date End Date Taking? Authorizing Provider  acetaminophen (TYLENOL) 325 MG tablet Take 650 mg by mouth every 6 (six) hours as needed.   Yes  Historical Provider, MD  cephALEXin (KEFLEX) 500 MG capsule Take 1 capsule (500 mg total) by mouth 4 (four) times daily. 05/01/16  Yes LMarylene Land NP  Cholecalciferol (VITAMIN D3) 2000 units TABS Take 2 tablets by mouth daily.   Yes Historical Provider, MD  co-enzyme Q-10 30 MG capsule Take 30 mg by mouth 3 (three) times daily.   Yes Historical Provider, MD  insulin glargine (LANTUS) 100 UNIT/ML injection Inject 38 Units into the skin daily.    Yes Historical Provider, MD  mupirocin ointment (BACTROBAN) 2 % Apply three times a day for 5 days. 05/01/16  Yes LMarylene Land NP  losartan (COZAAR) 25 MG tablet Take 1 tablet (25 mg total) by mouth daily. Reported on 05/02/2016 05/02/16   WAdline Potter MD    Allergies  Allergen Reactions  . Gluten Meal Other (See Comments)    GI distress  . Pioglitazone Other (See Comments)    Leg swelling    Past Surgical History  Procedure Laterality Date  . Joint replacement  2000, 2006  . Abdominal hysterectomy      Social History  Substance Use Topics  . Smoking status: Former SResearch scientist (life sciences) . Smokeless tobacco: None  . Alcohol Use: No    No family history on file.  Medication list has been reviewed and updated.  Physical Examination: BP 140/80 mmHg  Pulse 64  Ht '4\' 11"'  (1.499 m)  Wt 236 lb (107.049 kg)  BMI 47.64 kg/m2  Physical Exam  Constitutional: She is oriented to person,  place, and time. She appears well-developed and well-nourished.  HENT:  Right Ear: External ear normal.  Left Ear: External ear normal.  Nose: Nose normal.  Mouth/Throat: Oropharynx is clear and moist.  TM's clear  Eyes: Conjunctivae and EOM are normal. Pupils are equal, round, and reactive to light.  Neck: Neck supple. No thyromegaly present.  Cardiovascular: Normal rate, regular rhythm, normal heart sounds and intact distal pulses.   Pulmonary/Chest: Effort normal and breath sounds normal.  Abdominal: Soft. She exhibits no distension and no mass. There is no  tenderness.  Musculoskeletal:  2+ BLE edema  Lymphadenopathy:    She has no cervical adenopathy.  Neurological: She is alert and oriented to person, place, and time. Coordination normal.  Skin: Skin is warm and dry.  Psychiatric: She has a normal mood and affect. Her behavior is normal.  Nursing note and vitals reviewed.   Assessment and Plan:  1. Controlled type 2 diabetes mellitus with diabetic nephropathy, with long-term current use of insulin (HCC) Unclear control, may need statin - HgB A1c - B12 - Urine Microalbumin w/creat. ratio - Lipid Profile  2. Essential hypertension Marginal control, restart losartan 25 mg daily - Comprehensive metabolic panel - CBC  3. CKD (chronic kidney disease) stage 3, GFR 30-59 ml/min eGFR 37 in April  4. Vitamin D deficiency On supplement - Vitamin D (25 hydroxy)  5. Bilateral edema of lower extremity Consider Lasix if sxs worsen - Sed Rate (ESR)  6. Spinal stenosis of lumbar region Avoid NSAIDS due to CKD, consider Tylenol bid, declines gabapentin  7. Obesity, Class III, BMI 40-49.9 (morbid obesity) (HCC) Exercise/weight loss discussed - TSH  8. Need for pneumococcal vaccine Prevnar given - Pneumococcal conjugate vaccine 13-valent  Return in about 2 weeks (around 05/16/2016).  Satira Anis. [facility]  05/04/2016

## 2016-05-16 ENCOUNTER — Ambulatory Visit (INDEPENDENT_AMBULATORY_CARE_PROVIDER_SITE_OTHER): Payer: Medicare Other | Admitting: Family Medicine

## 2016-05-16 ENCOUNTER — Encounter: Payer: Self-pay | Admitting: Family Medicine

## 2016-05-16 VITALS — BP 120/82 | HR 80 | Ht 59.0 in | Wt 237.0 lb

## 2016-05-16 DIAGNOSIS — M4806 Spinal stenosis, lumbar region: Secondary | ICD-10-CM | POA: Diagnosis not present

## 2016-05-16 DIAGNOSIS — E1121 Type 2 diabetes mellitus with diabetic nephropathy: Secondary | ICD-10-CM

## 2016-05-16 DIAGNOSIS — E559 Vitamin D deficiency, unspecified: Secondary | ICD-10-CM

## 2016-05-16 DIAGNOSIS — R600 Localized edema: Secondary | ICD-10-CM | POA: Diagnosis not present

## 2016-05-16 DIAGNOSIS — Z794 Long term (current) use of insulin: Secondary | ICD-10-CM

## 2016-05-16 DIAGNOSIS — N183 Chronic kidney disease, stage 3 unspecified: Secondary | ICD-10-CM

## 2016-05-16 DIAGNOSIS — E538 Deficiency of other specified B group vitamins: Secondary | ICD-10-CM | POA: Diagnosis not present

## 2016-05-16 DIAGNOSIS — M48061 Spinal stenosis, lumbar region without neurogenic claudication: Secondary | ICD-10-CM

## 2016-05-16 MED ORDER — VITAMIN B-12 1000 MCG PO TABS: 1000.0000 ug | ORAL_TABLET | Freq: Every day | ORAL | Status: DC

## 2016-05-16 MED ORDER — CHOLECALCIFEROL 125 MCG (5000 UT) PO CAPS: 5000.0000 [IU] | ORAL_CAPSULE | Freq: Every day | ORAL | Status: DC

## 2016-05-16 NOTE — Progress Notes (Signed)
Date:  05/16/2016   Name:  Heather Long   DOB:  1928/03/16   MRN:  [id number]  PCP:  Adline Potter, MD    Chief Complaint: Follow-up   History of Present Illness:  This is a 81 y.o. female seen for two week f/u. Started on losartan last visit for marginal BP control. Feels retaining less fluid since, BLE edema stable. Blood work showed low vit D and B12, started on supplements, having increased intestinal gas but tolerable. Blood work also showed good DM control for age, stable CKD with elevated MCR, lipids near goal. Tylenol not helping much for spinal stenosis sxs but understands need to avoid NSAIDS.  Review of Systems:  Review of Systems  Constitutional: Negative for fever and fatigue.  Respiratory: Negative for cough and shortness of breath.   Cardiovascular: Negative for chest pain.  Endocrine: Negative for polyuria.  Genitourinary: Negative for difficulty urinating.  Neurological: Negative for syncope and light-headedness.    Patient Active Problem List   Diagnosis Date Noted  . Vitamin B12 deficiency 05/16/2016  . Obesity, Class III, BMI 40-49.9 (morbid obesity) ([place]) 05/02/2016  . CKD (chronic kidney disease) stage 3, GFR 30-59 ml/min 05/02/2016  . Controlled type 2 diabetes mellitus with diabetic nephropathy ([place]) 05/02/2016  . Vitamin D deficiency 05/02/2016  . Lower extremity edema 05/02/2016  . Spinal stenosis of lumbar region 05/02/2016    Prior to Admission medications   Medication Sig Start Date End Date Taking? Authorizing Provider  acetaminophen (TYLENOL) 325 MG tablet Take 650 mg by mouth every 6 (six) hours as needed.   Yes Historical Provider, MD  insulin glargine (LANTUS) 100 UNIT/ML injection Inject 38 Units into the skin daily.    Yes Historical Provider, MD  losartan (COZAAR) 25 MG tablet Take 1 tablet (25 mg total) by mouth daily. Reported on 05/02/2016 05/02/16  Yes Adline Potter, MD  Cholecalciferol 5000 units capsule Take 1 capsule (5,000 Units  total) by mouth daily. 05/16/16   Adline Potter, MD  vitamin B-12 (CYANOCOBALAMIN) 1000 MCG tablet Take 1 tablet (1,000 mcg total) by mouth daily. 05/16/16   Adline Potter, MD    Allergies  Allergen Reactions  . Gluten Meal Other (See Comments)    GI distress  . Pioglitazone Other (See Comments)    Leg swelling    Past Surgical History  Procedure Laterality Date  . Joint replacement  2000, 2006  . Abdominal hysterectomy      Social History  Substance Use Topics  . Smoking status: Former Research scientist (life sciences)  . Smokeless tobacco: None  . Alcohol Use: No    History reviewed. No pertinent family history.  Medication list has been reviewed and updated.  Physical Examination: BP 120/82 mmHg  Pulse 80  Ht 4\' 11"  (1.499 m)  Wt 237 lb (107.502 kg)  BMI 47.84 kg/m2  Physical Exam  Constitutional: She appears well-developed and well-nourished.  Cardiovascular: Normal rate, regular rhythm and normal heart sounds.   Pulmonary/Chest: Effort normal and breath sounds normal.  Musculoskeletal:  1+ BLE edema  Neurological: She is alert.  Skin: Skin is warm and dry.  Psychiatric: She has a normal mood and affect. Her behavior is normal.  Nursing note and vitals reviewed.   Assessment and Plan:  1. Controlled type 2 diabetes mellitus with diabetic nephropathy, with long-term current use of insulin (HCC) Well controlled for age, continue current insulin therapy, hold statin as lipids near goal  2. CKD (chronic kidney disease) stage 3, GFR 30-59 ml/min Stable past  year, losartan should help, avoid NSAIDS, consider repeat BMP/MCR next visit  3. Vitamin D deficiency On increased supplement, consider repeat level next visit  4. Vitamin B12 deficiency On supplement, consider repeat level next visit  5. Bilateral edema of lower extremity Stable, consider Lasix if worsens  6. Spinal stenosis of lumbar region Continue prn Tylenol, not surgical candidate  7. Obesity, Class III, BMI 40-49.9  (morbid obesity) (HCC) Weight stable, encouraged exercise 30 mins/d  Return in about 6 months (around 11/15/2016).  Satira Anis. [facility]  05/16/2016

## 2016-05-23 ENCOUNTER — Encounter: Payer: Self-pay | Admitting: Internal Medicine

## 2016-05-23 ENCOUNTER — Other Ambulatory Visit: Payer: Self-pay | Admitting: Internal Medicine

## 2016-05-23 ENCOUNTER — Ambulatory Visit (INDEPENDENT_AMBULATORY_CARE_PROVIDER_SITE_OTHER): Payer: Medicare Other | Admitting: Internal Medicine

## 2016-05-23 VITALS — BP 120/78 | HR 78 | Ht 59.0 in | Wt 235.0 lb

## 2016-05-23 DIAGNOSIS — L409 Psoriasis, unspecified: Secondary | ICD-10-CM | POA: Diagnosis not present

## 2016-05-23 DIAGNOSIS — M889 Osteitis deformans of unspecified bone: Secondary | ICD-10-CM | POA: Insufficient documentation

## 2016-05-23 DIAGNOSIS — H3530 Unspecified macular degeneration: Secondary | ICD-10-CM | POA: Insufficient documentation

## 2016-05-23 DIAGNOSIS — D485 Neoplasm of uncertain behavior of skin: Secondary | ICD-10-CM | POA: Diagnosis not present

## 2016-05-23 NOTE — Progress Notes (Signed)
Date:  05/23/2016   Name:  Heather Long   DOB:  September 25, 1928   MRN:  [id number]   Chief Complaint: [place] Patient was visiting her daughter-in-law recently who noticed a lesion on her right cheek. The patient states she was not aware of it but now notices a scaly area. She also has 2 moles nearby which have been present for some time. She has never had a dermatologist evaluation. She also has a scaly silvery red-based patch on her right wrist which has been there off and on for years. This is never been diagnosed or treated with prescription medication.   Review of Systems  Skin: Positive for color change and rash. Negative for pallor and wound.  Hematological: Negative for adenopathy.    Patient Active Problem List   Diagnosis Date Noted  . Degeneration macular 05/23/2016  . Osteitis deformans 05/23/2016  . Vitamin B12 deficiency 05/16/2016  . Obesity, Class III, BMI 40-49.9 (morbid obesity) ([place]) 05/02/2016  . CKD (chronic kidney disease) stage 3, GFR 30-59 ml/min 05/02/2016  . Controlled type 2 diabetes mellitus with diabetic nephropathy ([place]) 05/02/2016  . Vitamin D deficiency 05/02/2016  . Lower extremity edema 05/02/2016  . Spinal stenosis of lumbar region 05/02/2016  . Essential (primary) hypertension 06/02/2012  . HLD (hyperlipidemia) 06/02/2012  . Malignant neoplasm of breast ([place]) 06/14/2011  . Generalized OA 06/14/2011  . Lymphedema of arm 06/14/2011  . OP (osteoporosis) 06/14/2011    Prior to Admission medications   Medication Sig Start Date End Date Taking? Authorizing Provider  ACCU-CHEK AVIVA PLUS test strip U UTD TID 04/10/16  Yes Historical Provider, MD  acetaminophen (TYLENOL) 325 MG tablet Take 650 mg by mouth every 6 (six) hours as needed.   Yes Historical Provider, MD  Cholecalciferol 5000 units capsule Take 1 capsule (5,000 Units total) by mouth daily. 05/16/16  Yes Adline Potter, MD  insulin glargine (LANTUS) 100 UNIT/ML injection Inject 38 Units into  the skin daily.    Yes Historical Provider, MD  losartan (COZAAR) 25 MG tablet Take 1 tablet (25 mg total) by mouth daily. Reported on 05/02/2016 05/02/16  Yes Adline Potter, MD  vitamin B-12 (CYANOCOBALAMIN) 1000 MCG tablet Take 1 tablet (1,000 mcg total) by mouth daily. 05/16/16  Yes Adline Potter, MD    Allergies  Allergen Reactions  . Gluten Meal Other (See Comments)    GI distress  . Pioglitazone Other (See Comments)    Leg swelling    Past Surgical History  Procedure Laterality Date  . Joint replacement  2000, 2006  . Abdominal hysterectomy      Social History  Substance Use Topics  . Smoking status: Former Research scientist (life sciences)  . Smokeless tobacco: None  . Alcohol Use: No     Medication list has been reviewed and updated.   Physical Exam  Constitutional: She is oriented to person, place, and time. She appears well-developed. No distress.  HENT:  Head: Normocephalic and atraumatic.  Neck: Normal range of motion.  Pulmonary/Chest: Effort normal. No respiratory distress.  Musculoskeletal: Normal range of motion.  Lymphadenopathy:    She has no cervical adenopathy.  Neurological: She is alert and oriented to person, place, and time.  Skin: Skin is warm and dry. No rash noted.     Psychiatric: She has a normal mood and affect. Her behavior is normal. Thought content normal.  Nursing note and vitals reviewed.   BP 120/78 mmHg  Pulse 78  Ht 4\' 11"  (1.499 m)  Wt 235 lb (  106.595 kg)  BMI 47.44 kg/m2  Assessment and Plan: 1. Neoplasm of uncertain behavior of skin of face Likely benign but new - - Ambulatory referral to Dermatology  2. Psoriasis Continue topical moisturizers as needed   Halina Maidens, MD [facility]  05/23/2016

## 2016-06-20 DIAGNOSIS — L821 Other seborrheic keratosis: Secondary | ICD-10-CM | POA: Diagnosis not present

## 2016-06-20 DIAGNOSIS — L400 Psoriasis vulgaris: Secondary | ICD-10-CM | POA: Diagnosis not present

## 2016-06-20 DIAGNOSIS — L570 Actinic keratosis: Secondary | ICD-10-CM | POA: Diagnosis not present

## 2016-06-20 DIAGNOSIS — L304 Erythema intertrigo: Secondary | ICD-10-CM | POA: Diagnosis not present

## 2016-07-16 DIAGNOSIS — L821 Other seborrheic keratosis: Secondary | ICD-10-CM | POA: Diagnosis not present

## 2016-07-16 DIAGNOSIS — B372 Candidiasis of skin and nail: Secondary | ICD-10-CM | POA: Diagnosis not present

## 2016-07-20 ENCOUNTER — Emergency Department
Admission: EM | Admit: 2016-07-20 | Discharge: 2016-07-21 | Disposition: A | Discharge status: Home / Self Care | Payer: Medicare Other | Attending: Emergency Medicine | Admitting: Emergency Medicine

## 2016-07-20 ENCOUNTER — Emergency Department: Payer: Medicare Other

## 2016-07-20 DIAGNOSIS — S0990XA Unspecified injury of head, initial encounter: Secondary | ICD-10-CM | POA: Diagnosis not present

## 2016-07-20 DIAGNOSIS — Y9389 Activity, other specified: Secondary | ICD-10-CM | POA: Diagnosis not present

## 2016-07-20 DIAGNOSIS — S52501A Unspecified fracture of the lower end of right radius, initial encounter for closed fracture: Secondary | ICD-10-CM | POA: Insufficient documentation

## 2016-07-20 DIAGNOSIS — S52601A Unspecified fracture of lower end of right ulna, initial encounter for closed fracture: Secondary | ICD-10-CM | POA: Diagnosis not present

## 2016-07-20 DIAGNOSIS — Z79899 Other long term (current) drug therapy: Secondary | ICD-10-CM | POA: Insufficient documentation

## 2016-07-20 DIAGNOSIS — I129 Hypertensive chronic kidney disease with stage 1 through stage 4 chronic kidney disease, or unspecified chronic kidney disease: Secondary | ICD-10-CM | POA: Diagnosis not present

## 2016-07-20 DIAGNOSIS — W19XXXA Unspecified fall, initial encounter: Secondary | ICD-10-CM

## 2016-07-20 DIAGNOSIS — Y999 Unspecified external cause status: Secondary | ICD-10-CM | POA: Diagnosis not present

## 2016-07-20 DIAGNOSIS — N183 Chronic kidney disease, stage 3 (moderate): Secondary | ICD-10-CM | POA: Diagnosis not present

## 2016-07-20 DIAGNOSIS — S199XXA Unspecified injury of neck, initial encounter: Secondary | ICD-10-CM | POA: Diagnosis not present

## 2016-07-20 DIAGNOSIS — W1800XA Striking against unspecified object with subsequent fall, initial encounter: Secondary | ICD-10-CM | POA: Insufficient documentation

## 2016-07-20 DIAGNOSIS — S0181XA Laceration without foreign body of other part of head, initial encounter: Secondary | ICD-10-CM | POA: Insufficient documentation

## 2016-07-20 DIAGNOSIS — R079 Chest pain, unspecified: Secondary | ICD-10-CM | POA: Diagnosis not present

## 2016-07-20 DIAGNOSIS — T148 Other injury of unspecified body region: Secondary | ICD-10-CM | POA: Diagnosis not present

## 2016-07-20 DIAGNOSIS — Z87891 Personal history of nicotine dependence: Secondary | ICD-10-CM | POA: Diagnosis not present

## 2016-07-20 DIAGNOSIS — S0083XA Contusion of other part of head, initial encounter: Secondary | ICD-10-CM | POA: Diagnosis not present

## 2016-07-20 DIAGNOSIS — E1122 Type 2 diabetes mellitus with diabetic chronic kidney disease: Secondary | ICD-10-CM | POA: Diagnosis not present

## 2016-07-20 DIAGNOSIS — Y929 Unspecified place or not applicable: Secondary | ICD-10-CM | POA: Insufficient documentation

## 2016-07-20 DIAGNOSIS — T148XXA Other injury of unspecified body region, initial encounter: Secondary | ICD-10-CM

## 2016-07-20 DIAGNOSIS — Z794 Long term (current) use of insulin: Secondary | ICD-10-CM | POA: Diagnosis not present

## 2016-07-20 DIAGNOSIS — Z85828 Personal history of other malignant neoplasm of skin: Secondary | ICD-10-CM | POA: Insufficient documentation

## 2016-07-20 DIAGNOSIS — S52591A Other fractures of lower end of right radius, initial encounter for closed fracture: Secondary | ICD-10-CM | POA: Diagnosis not present

## 2016-07-20 DIAGNOSIS — R220 Localized swelling, mass and lump, head: Secondary | ICD-10-CM | POA: Diagnosis not present

## 2016-07-20 DIAGNOSIS — S0121XA Laceration without foreign body of nose, initial encounter: Secondary | ICD-10-CM | POA: Insufficient documentation

## 2016-07-20 DIAGNOSIS — M542 Cervicalgia: Secondary | ICD-10-CM | POA: Diagnosis not present

## 2016-07-20 HISTORY — PX: ORIF WRIST FRACTURE: SHX2133

## 2016-07-20 MED ORDER — OXYCODONE-ACETAMINOPHEN 5-325 MG PO TABS
2.0000 | ORAL_TABLET | Freq: Once | ORAL | Status: DC
  Filled 2016-07-20: qty 2

## 2016-07-20 NOTE — ED Triage Notes (Signed)
Pt presents to ED via [facility] from home. Pt was bending over to feet cat, tried to kill a bug that pt says was too far away and she fell and hit her face. Pt also c/o R wrist pain (which looks swollen), L hand pain, back pain, L sided neck/chest pain (pt states lymphedema and mastectomy to L chest 6 yrs ago. Pt with gauze covering bridge of nose and forehead. Hx diabetes.   CBG 191, HR 80's, uppder 90's RA oxygen saturation with EMS.   Pt denies use of blood thinners.   Pt's hearing aid on L side is out, still has hearing aid of R side, hard of hearing.

## 2016-07-20 NOTE — ED Provider Notes (Signed)
[facility] Emergency Department Provider Note   ____________________________________________   First MD Initiated Contact with Patient 07/20/16 2306     (approximate)  I have reviewed the triage vital signs and the nursing notes.   HISTORY  Chief Complaint Fall    HPI Heather Long is a 82 y.o. female who comes into the hospital today with a fall. The patient reports that she fell on her face and thinks she broke her right arm. She is achy all over. According to the patient's son the cat got in the way. The patient reports that she noted a bug on the floor and went to try to get it with the flash swatter. She reports that she leaned over to hit the bug and she fell on her face. The patient did not pass out she did hit her head. She reports that she's never hurt herself like this in the past. She denies any nausea or vomiting but reports she has some right wrist to elbow pain, left back pain and neck pain. The patient also endorses a headache. The patient rates her pain 8 out of 10 in intensity. She is here for evaluation tonight.   Past Medical History:  Diagnosis Date  . Cancer ([place])   . Chronic kidney disease   . Diabetes mellitus without complication ([place])   . Gout   . Hyperlipidemia   . Hypertension   . Lymphedema   . Obesity   . Osteoporosis   . Paget's disease of bone   . Spinal stenosis     Patient Active Problem List   Diagnosis Date Noted  . Degeneration macular 05/23/2016  . Osteitis deformans 05/23/2016  . Neoplasm of uncertain behavior of skin of face 05/23/2016  . Psoriasis 05/23/2016  . Vitamin B12 deficiency 05/16/2016  . Obesity, Class III, BMI 40-49.9 (morbid obesity) ([place]) 05/02/2016  . CKD (chronic kidney disease) stage 3, GFR 30-59 ml/min 05/02/2016  . Controlled type 2 diabetes mellitus with diabetic nephropathy ([place]) 05/02/2016  . Vitamin D deficiency 05/02/2016  . Lower extremity edema 05/02/2016  . Spinal  stenosis of lumbar region 05/02/2016  . Essential (primary) hypertension 06/02/2012  . HLD (hyperlipidemia) 06/02/2012  . Malignant neoplasm of breast ([place]) 06/14/2011  . Generalized OA 06/14/2011  . Lymphedema of arm 06/14/2011  . OP (osteoporosis) 06/14/2011    Past Surgical History:  Procedure Laterality Date  . ABDOMINAL HYSTERECTOMY    . JOINT REPLACEMENT  2000, 2006    Prior to Admission medications   Medication Sig Start Date End Date Taking? Authorizing Provider  ACCU-CHEK AVIVA PLUS test strip U UTD TID 04/10/16   Historical Provider, MD  acetaminophen (TYLENOL) 325 MG tablet Take 650 mg by mouth every 6 (six) hours as needed.    Historical Provider, MD  Cholecalciferol 5000 units capsule Take 1 capsule (5,000 Units total) by mouth daily. 05/16/16   Adline Potter, MD  HYDROcodone-acetaminophen (NORCO/VICODIN) 5-325 MG tablet Take 1 tablet by mouth every 4 (four) hours as needed for moderate pain. 07/21/16 07/21/17  Loney Hering, MD  insulin glargine (LANTUS) 100 UNIT/ML injection Inject 38 Units into the skin daily.     Historical Provider, MD  losartan (COZAAR) 25 MG tablet Take 1 tablet (25 mg total) by mouth daily. Reported on 05/02/2016 05/02/16   Adline Potter, MD  vitamin B-12 (CYANOCOBALAMIN) 1000 MCG tablet Take 1 tablet (1,000 mcg total) by mouth daily. 05/16/16   Adline Potter, MD    Allergies Gluten meal  and Pioglitazone  History reviewed. No pertinent family history.  Social History Social History  Substance Use Topics  . Smoking status: Former Research scientist (life sciences)  . Smokeless tobacco: Not on file  . Alcohol use No    Review of Systems Constitutional: No fever/chills Eyes: No visual changes. ENT: No sore throat. Cardiovascular: Denies chest pain. Respiratory: Denies shortness of breath. Gastrointestinal: No abdominal pain.  No nausea, no vomiting.  No diarrhea.  No constipation. Genitourinary: Negative for dysuria. Musculoskeletal: right wrist pain, neck pain, left  back pain Skin: Negative for rash. Neurological: Headache  10-point ROS otherwise negative.  ____________________________________________   PHYSICAL EXAM:  VITAL SIGNS: ED Triage Vitals [07/20/16 2235]  Enc Vitals Group     BP (!) 186/79     Pulse Rate 78     Resp 17     Temp 97.9 F (36.6 C)     Temp src      SpO2 96 %     Weight      Height      Head Circumference      Peak Flow      Pain Score 7     Pain Loc      Pain Edu?      Excl. in [place]?     Constitutional: Alert and oriented. Well appearing and in Moderate distress. Eyes: Conjunctivae are normal. PERRL. EOMI. Head: Contusion to the left forehead Nose: Contusion and skin tear to nasal bridge Mouth/Throat: Mucous membranes are moist.  Oropharynx non-erythematous. Neck: cervical spine tenderness to palpation Cardiovascular: Normal rate, regular rhythm. Grossly normal heart sounds.  Good peripheral circulation. Respiratory: Normal respiratory effort.  No retractions. Lungs CTAB. Gastrointestinal: Soft and nontender. No distention. Positive bowel sounds Musculoskeletal: No hip pain to palpation, left wrist pain to palpation and passive range of motion. Neurologic:  Normal speech and language.  Skin:  Skin is warm, dry skin tear to patient's left forehead, skin tear to patient's nasal bridge. Psychiatric: Mood and affect are normal.   ____________________________________________   LABS (all labs ordered are listed, but only abnormal results are displayed)  Labs Reviewed - No data to display ____________________________________________  EKG  ED ECG REPORT I, Loney Hering, the attending physician, personally viewed and interpreted this ECG.   Date: 07/20/2016  EKG Time: 2238  Rate: 75  Rhythm: normal sinus rhythm  Axis: normal  Intervals:none  ST&T Change: none  ____________________________________________  RADIOLOGY  CT head and cervical spine Right wrist and forearm x-ray Chest  x-ray ____________________________________________   PROCEDURES  Procedure(s) performed: please, see procedure note(s).  .Splint Application Date/Time: 123XX123 1:30 AM Performed by: Loney Hering Authorized by: Loney Hering   Consent:    Consent obtained:  Verbal   Consent given by:  Patient   Risks discussed:  Discoloration, numbness, pain and swelling Pre-procedure details:    Sensation:  Normal Procedure details:    Laterality:  Right   Location:  Wrist   Wrist:  R wrist   Strapping: no     Cast type:  Short arm   Splint type: clam shell.   Supplies:  [facility] Post-procedure details:    Pain:  Unchanged   Sensation:  Normal   Skin color:  Normal   Patient tolerance of procedure:  Tolerated well, no immediate complications    Critical Care performed: No  ____________________________________________   INITIAL IMPRESSION / ASSESSMENT AND PLAN / ED COURSE  Pertinent labs & imaging results that were available during my  care of the patient were reviewed by me and considered in my medical decision making (see chart for details).  This is an 82 year old female who comes into the hospital today after a fall at home. The patient did not pass out and is not on any blood thinners but I will send her for some imaging to include a CT of her head and cervical spine as well as some x-rays of her wrist and chest. I will give the patient a dose of Percocet for her pain. I will then attempted to use some Steri-Strips to repair the patient's skin tears. I will reassess the patient once I received the imaging results.  Clinical Course  Value Comment By Time  CT Head Wo Contrast 1. No acute intracranial abnormality or skull fracture is identified. Left frontal scalp contusion. 2. No fracture or dislocation of the cervical spine is identified. 3. Mild chronic microvascular ischemic changes and parenchymal volume loss of the brain. 4. Multilevel degenerative changes of  the cervical spine without high-grade bony canal stenosis or foraminal narrowing.   Loney Hering, MD 09/02 0007  DG Chest 2 View 1. No displaced rib fracture seen. 2. Elevation of the right hemidiaphragm. Mild left basilar atelectasis noted. 3. Borderline cardiomegaly.   Loney Hering, MD 09/02 0008  DG Wrist Complete Right Transverse fractures of the distal right radial metaphysis with dorsal angulation and impaction of fracture fragments. Ulnar styloid process fracture. Soft tissue swelling. Right radius and ulna otherwise intact.   Loney Hering, MD 09/02 0008    The patient tolerated the splint well. She did complain some more about some pain on her left side. She reports that itself by her chest. I explained to her that there is always a possibility for rib contusion but she reports that since she's had her mastectomy she always has pain on that side. The patient will be given a dose of tramadol and she will be discharged home to follow up with orthopedic surgery. ____________________________________________   FINAL CLINICAL IMPRESSION(S) / ED DIAGNOSES  Final diagnoses:  Fall, initial encounter  Radius and ulna distal fracture, right, closed, initial encounter  Multiple skin tears  Contusion  Head injury, initial encounter      NEW MEDICATIONS STARTED DURING THIS VISIT:  New Prescriptions   HYDROCODONE-ACETAMINOPHEN (NORCO/VICODIN) 5-325 MG TABLET    Take 1 tablet by mouth every 4 (four) hours as needed for moderate pain.     Note:  This document was prepared using Dragon voice recognition software and may include unintentional dictation errors.    Loney Hering, MD 07/21/16 [phone]

## 2016-07-20 NOTE — ED Notes (Signed)
Pt denies LOC, remembers whole event. Pt R wrist elevated on towel.

## 2016-07-21 ENCOUNTER — Telehealth: Payer: Self-pay | Admitting: Internal Medicine

## 2016-07-21 DIAGNOSIS — S52531D Colles' fracture of right radius, subsequent encounter for closed fracture with routine healing: Secondary | ICD-10-CM | POA: Diagnosis not present

## 2016-07-21 MED ORDER — TRAMADOL HCL 50 MG PO TABS
50.0000 mg | ORAL_TABLET | Freq: Once | ORAL | Status: AC
  Administered 2016-07-21: 50 mg via ORAL
  Filled 2016-07-21: qty 1

## 2016-07-21 MED ORDER — HYDROCODONE-ACETAMINOPHEN 5-325 MG PO TABS: 1.0000 | ORAL_TABLET | ORAL | 0 refills | Status: DC | PRN

## 2016-07-21 MED ORDER — ACETAMINOPHEN-CODEINE #3 300-30 MG PO TABS
2.0000 | ORAL_TABLET | Freq: Once | ORAL | Status: AC
  Administered 2016-07-21: 2 via ORAL
  Filled 2016-07-21: qty 2

## 2016-07-21 NOTE — Telephone Encounter (Signed)
Patient fell yesterday at home and fractured her right wrist.  A splint was placed and she was discharged home with family.  Family was told that she might need home care and wanted a referral. I spoke at length with Butch Penny, daughter in law, and explained that there are no services for home care that are immediately available.  Because patient is elderly, lives alone, and is right handed, she requires assistance with most ADLs. She may also require surgical correction.  At this time, family will be responsible for providing care.  They could pay out of pocket for an aid through an agency such as [facility].  It is a holiday weekend so she can not call for Ortho appt for 3 days.  I suggested that she be seen at either [facility] or [facility] today (both have Saturday hours).  She can get more pain medication and schedule a surgical consultation.  Phone numbers for both were given from the websites.

## 2016-07-21 NOTE — ED Notes (Signed)
Pt ambulated in room with no assistance 

## 2016-07-24 DIAGNOSIS — Z853 Personal history of malignant neoplasm of breast: Secondary | ICD-10-CM | POA: Diagnosis not present

## 2016-07-24 DIAGNOSIS — S52551A Other extraarticular fracture of lower end of right radius, initial encounter for closed fracture: Secondary | ICD-10-CM | POA: Diagnosis not present

## 2016-07-24 DIAGNOSIS — Z794 Long term (current) use of insulin: Secondary | ICD-10-CM | POA: Diagnosis not present

## 2016-07-24 DIAGNOSIS — S52531A Colles' fracture of right radius, initial encounter for closed fracture: Secondary | ICD-10-CM | POA: Diagnosis not present

## 2016-07-24 DIAGNOSIS — Z79891 Long term (current) use of opiate analgesic: Secondary | ICD-10-CM | POA: Diagnosis not present

## 2016-07-24 DIAGNOSIS — I10 Essential (primary) hypertension: Secondary | ICD-10-CM | POA: Diagnosis not present

## 2016-07-24 DIAGNOSIS — E119 Type 2 diabetes mellitus without complications: Secondary | ICD-10-CM | POA: Diagnosis not present

## 2016-07-24 DIAGNOSIS — S52611A Displaced fracture of right ulna styloid process, initial encounter for closed fracture: Secondary | ICD-10-CM | POA: Diagnosis not present

## 2016-07-24 DIAGNOSIS — Z87891 Personal history of nicotine dependence: Secondary | ICD-10-CM | POA: Diagnosis not present

## 2016-07-30 DIAGNOSIS — S52531D Colles' fracture of right radius, subsequent encounter for closed fracture with routine healing: Secondary | ICD-10-CM | POA: Diagnosis not present

## 2016-08-06 DIAGNOSIS — S52531D Colles' fracture of right radius, subsequent encounter for closed fracture with routine healing: Secondary | ICD-10-CM | POA: Diagnosis not present

## 2016-08-08 ENCOUNTER — Other Ambulatory Visit: Payer: Self-pay | Admitting: Internal Medicine

## 2016-08-08 MED ORDER — INSULIN GLARGINE 100 UNIT/ML ~~LOC~~ SOLN: 38.0000 [IU] | Freq: Every day | SUBCUTANEOUS | 5 refills | Status: DC

## 2016-08-13 ENCOUNTER — Telehealth: Payer: Self-pay

## 2016-08-13 NOTE — Telephone Encounter (Signed)
Patient is calling in regards to getting a prescription for a lift chair - please advise.

## 2016-08-14 NOTE — Telephone Encounter (Signed)
Never seen by me for this issue.  Should take it up with Orthopedics or whoever treats her back and arthritis.

## 2016-08-14 NOTE — Telephone Encounter (Signed)
LM

## 2016-08-14 NOTE — Telephone Encounter (Signed)
Can we do RX for lift chair?

## 2016-09-11 DIAGNOSIS — S52531D Colles' fracture of right radius, subsequent encounter for closed fracture with routine healing: Secondary | ICD-10-CM | POA: Diagnosis not present

## 2016-09-14 DIAGNOSIS — S52531D Colles' fracture of right radius, subsequent encounter for closed fracture with routine healing: Secondary | ICD-10-CM | POA: Diagnosis not present

## 2016-09-18 ENCOUNTER — Ambulatory Visit (INDEPENDENT_AMBULATORY_CARE_PROVIDER_SITE_OTHER): Payer: Medicare Other | Admitting: Internal Medicine

## 2016-09-18 ENCOUNTER — Encounter: Payer: Self-pay | Admitting: Internal Medicine

## 2016-09-18 VITALS — BP 148/86 | HR 78 | Resp 16 | Ht 59.0 in | Wt 229.4 lb

## 2016-09-18 DIAGNOSIS — M48061 Spinal stenosis, lumbar region without neurogenic claudication: Secondary | ICD-10-CM | POA: Diagnosis not present

## 2016-09-18 DIAGNOSIS — E785 Hyperlipidemia, unspecified: Secondary | ICD-10-CM | POA: Diagnosis not present

## 2016-09-18 DIAGNOSIS — Z794 Long term (current) use of insulin: Secondary | ICD-10-CM

## 2016-09-18 DIAGNOSIS — I10 Essential (primary) hypertension: Secondary | ICD-10-CM | POA: Diagnosis not present

## 2016-09-18 DIAGNOSIS — E538 Deficiency of other specified B group vitamins: Secondary | ICD-10-CM

## 2016-09-18 DIAGNOSIS — E1169 Type 2 diabetes mellitus with other specified complication: Secondary | ICD-10-CM

## 2016-09-18 DIAGNOSIS — R600 Localized edema: Secondary | ICD-10-CM

## 2016-09-18 DIAGNOSIS — N183 Chronic kidney disease, stage 3 unspecified: Secondary | ICD-10-CM

## 2016-09-18 DIAGNOSIS — E559 Vitamin D deficiency, unspecified: Secondary | ICD-10-CM

## 2016-09-18 DIAGNOSIS — E1121 Type 2 diabetes mellitus with diabetic nephropathy: Secondary | ICD-10-CM

## 2016-09-18 MED ORDER — GABAPENTIN 100 MG PO CAPS: 300.0000 mg | ORAL_CAPSULE | Freq: Two times a day (BID) | ORAL | 0 refills | Status: DC

## 2016-09-18 MED ORDER — LOSARTAN POTASSIUM 25 MG PO TABS: 25.0000 mg | ORAL_TABLET | Freq: Every day | ORAL | 5 refills | Status: DC

## 2016-09-18 NOTE — Progress Notes (Signed)
Date:  09/18/2016   Name:  Heather Long   DOB:  1927/11/25   MRN:  [id number]   Chief Complaint: Diabetes; Hypertension; Back Pain (Since fall 07/20/2016 (Seen in ER and had CT )she has had severe lower back pain. Has seen [facility]ron[facility] and they only give her pills that she will not take. She wants a back brace as she can hardly make it up here for appts. Needs a lift chair and has the papers for that. ); and Headache (Has many headaches in back of head and ball of neck since fall. ) Diabetes  She presents for her follow-up diabetic visit. She has type 2 diabetes mellitus. Pertinent negatives for diabetes include no chest pain and no fatigue. Diabetic complications include nephropathy. Current diabetic treatment includes insulin injections.  Hypertension  This is a chronic problem. The current episode started more than 1 year ago. The problem is controlled. Pertinent negatives include no chest pain, palpitations or shortness of breath.  Back Pain  This is a chronic (for 10+ years) problem. The problem occurs daily. The problem is unchanged. The pain is present in the lumbar spine. The quality of the pain is described as aching and burning. The pain is the same all the time. The symptoms are aggravated by standing and bending. Pertinent negatives include no abdominal pain, chest pain or fever. She has tried NSAIDs and muscle relaxant (also ESI with no benefit) for the symptoms.  She has seen Ortho at [facility] and at [facility],  Did not like [facility].  She does not want to take pain medications or medication like gabapentin.  These have been prescribed but she never tried them (apparently her children are very involved in her medications and try to get her to avoid them.).  She has tried accupuncture with some benefit but it is too far away.  She wants a back brace and a lift chair. She has been doing physical therapy and walks with minimal use of a cane. Wrist fracture - she has had wrist surgery and is doing  very well.  This happened at her fall.  Lab Results  Component Value Date   HGBA1C 7.2 (H) 05/02/2016   Lab Results  Component Value Date   CHOL 196 05/02/2016   HDL 54 05/02/2016   LDLCALC 104 (H) 05/02/2016   TRIG 189 (H) 05/02/2016   CHOLHDL 3.6 05/02/2016   Lab Results  Component Value Date   CREATININE 1.31 (H) 05/02/2016      Review of Systems  Constitutional: Negative for chills, fatigue and fever.  Respiratory: Negative for chest tightness, shortness of breath and stridor.   Cardiovascular: Negative for chest pain, palpitations and leg swelling.  Gastrointestinal: Negative for abdominal pain and blood in stool.  Musculoskeletal: Positive for arthralgias (right wrist fracture surgery), back pain and gait problem.  Skin: Negative for rash.    Patient Active Problem List   Diagnosis Date Noted  . Degeneration macular 05/23/2016  . Osteitis deformans 05/23/2016  . Neoplasm of uncertain behavior of skin of face 05/23/2016  . Psoriasis 05/23/2016  . Vitamin B12 deficiency 05/16/2016  . Obesity, Class III, BMI 40-49.9 (morbid obesity) ([place]) 05/02/2016  . CKD (chronic kidney disease) stage 3, GFR 30-59 ml/min 05/02/2016  . Controlled type 2 diabetes mellitus with diabetic nephropathy ([place]) 05/02/2016  . Vitamin D deficiency 05/02/2016  . Lower extremity edema 05/02/2016  . Spinal stenosis of lumbar region 05/02/2016  . Essential (primary) hypertension 06/02/2012  . Hyperlipidemia associated  with type 2 diabetes mellitus ([place]) 06/02/2012  . Malignant neoplasm of breast ([place]) 06/14/2011  . Generalized OA 06/14/2011  . Lymphedema of arm 06/14/2011  . OP (osteoporosis) 06/14/2011    Prior to Admission medications   Medication Sig Start Date End Date Taking? Authorizing Provider  ACCU-CHEK AVIVA PLUS test strip U UTD TID 04/10/16   Historical Provider, MD  acetaminophen (TYLENOL) 325 MG tablet Take 650 mg by mouth every 6 (six) hours as needed.    Historical Provider,  MD  Cholecalciferol 5000 units capsule Take 1 capsule (5,000 Units total) by mouth daily. 05/16/16   Adline Potter, MD  HYDROcodone-acetaminophen (NORCO/VICODIN) 5-325 MG tablet Take 1 tablet by mouth every 4 (four) hours as needed for moderate pain. 07/21/16 07/21/17  Loney Hering, MD  insulin glargine (LANTUS) 100 UNIT/ML injection Inject 0.38 mLs (38 Units total) into the skin daily. 08/08/16   Adline Potter, MD  losartan (COZAAR) 25 MG tablet Take 1 tablet (25 mg total) by mouth daily. Reported on 05/02/2016 05/02/16   Adline Potter, MD  vitamin B-12 (CYANOCOBALAMIN) 1000 MCG tablet Take 1 tablet (1,000 mcg total) by mouth daily. 05/16/16   Adline Potter, MD    Allergies  Allergen Reactions  . Gluten Meal Other (See Comments)    GI distress  . Pioglitazone Other (See Comments)    Leg swelling    Past Surgical History:  Procedure Laterality Date  . ABDOMINAL HYSTERECTOMY    . JOINT REPLACEMENT  2000, 2006    Social History  Substance Use Topics  . Smoking status: Former Research scientist (life sciences)  . Smokeless tobacco: Never Used  . Alcohol use No     Medication list has been reviewed and updated.   Physical Exam  Constitutional: She is oriented to person, place, and time. She appears well-developed. No distress.  HENT:  Head: Normocephalic and atraumatic.  Cardiovascular: Normal rate, regular rhythm and normal heart sounds.   Pulmonary/Chest: Effort normal. No respiratory distress.  Musculoskeletal: Normal range of motion.       Lumbar back: She exhibits tenderness and spasm.  Bilateral knee replacements  Neurological: She is alert and oriented to person, place, and time. She has normal strength. She displays no atrophy and no tremor. No cranial nerve deficit or sensory deficit. She displays no seizure activity. Coordination and gait normal.  Skin: Skin is warm and dry. No rash noted.  Psychiatric: She has a normal mood and affect. Her behavior is normal. Thought content normal.  Nursing note  and vitals reviewed.   BP (!) 148/86   Pulse 78   Resp 16   Ht 4\' 11"  (1.499 m)   Wt 229 lb 6.4 oz (104.1 kg)   SpO2 98%   BMI 46.33 kg/m   Assessment and Plan: 1. Essential (primary) hypertension Fair control - continue current medication  2. Controlled type 2 diabetes mellitus with diabetic nephropathy, with long-term current use of insulin (H[facility]) Continue insulin - Hemoglobin A1c  3. CKD (chronic kidney disease) stage 3, GFR 30-59 ml/min Monitor regularly  4. Hyperlipidemia associated with type 2 diabetes mellitus ([place]) Needs to consider statin therapy  5. Vitamin D deficiency On oral supplement  6. Vitamin B12 deficiency On oral supplement  7. Spinal stenosis of lumbar region without neurogenic claudication Back brace not appropriate Lift chair form completed - doubt she will qualify  - gabapentin (NEURONTIN) 100 MG capsule; Take 3 capsules (300 mg total) by mouth 2 (two) times daily.  Dispense: 60 capsule; Refill: 0  8. Lower extremity edema May benefit from mild diuretic but will hold off for now   Halina Maidens, MD [facility]  09/18/2016

## 2016-09-19 LAB — HEMOGLOBIN A1C
ESTIMATED AVERAGE GLUCOSE: 148 mg/dL
Hgb A1c MFr Bld: 6.8 % — ABNORMAL HIGH (ref 4.8–5.6)

## 2016-11-13 ENCOUNTER — Encounter: Payer: Self-pay | Admitting: Internal Medicine

## 2016-11-14 ENCOUNTER — Other Ambulatory Visit: Payer: Self-pay | Admitting: Internal Medicine

## 2016-11-14 ENCOUNTER — Ambulatory Visit (INDEPENDENT_AMBULATORY_CARE_PROVIDER_SITE_OTHER): Payer: Medicare Other | Admitting: Internal Medicine

## 2016-11-14 ENCOUNTER — Encounter: Payer: Self-pay | Admitting: Internal Medicine

## 2016-11-14 ENCOUNTER — Ambulatory Visit: Payer: Medicare Other | Admitting: Family Medicine

## 2016-11-14 VITALS — BP 132/78 | HR 78 | Temp 97.4°F | Ht 59.0 in | Wt 231.0 lb

## 2016-11-14 DIAGNOSIS — E1122 Type 2 diabetes mellitus with diabetic chronic kidney disease: Secondary | ICD-10-CM | POA: Diagnosis not present

## 2016-11-14 DIAGNOSIS — I129 Hypertensive chronic kidney disease with stage 1 through stage 4 chronic kidney disease, or unspecified chronic kidney disease: Secondary | ICD-10-CM

## 2016-11-14 DIAGNOSIS — E559 Vitamin D deficiency, unspecified: Secondary | ICD-10-CM

## 2016-11-14 DIAGNOSIS — N184 Chronic kidney disease, stage 4 (severe): Secondary | ICD-10-CM | POA: Diagnosis not present

## 2016-11-14 DIAGNOSIS — R1033 Periumbilical pain: Secondary | ICD-10-CM

## 2016-11-14 DIAGNOSIS — E785 Hyperlipidemia, unspecified: Secondary | ICD-10-CM | POA: Diagnosis not present

## 2016-11-14 DIAGNOSIS — E538 Deficiency of other specified B group vitamins: Secondary | ICD-10-CM

## 2016-11-14 DIAGNOSIS — E1169 Type 2 diabetes mellitus with other specified complication: Secondary | ICD-10-CM

## 2016-11-14 DIAGNOSIS — I10 Essential (primary) hypertension: Secondary | ICD-10-CM | POA: Diagnosis not present

## 2016-11-14 DIAGNOSIS — M48061 Spinal stenosis, lumbar region without neurogenic claudication: Secondary | ICD-10-CM | POA: Diagnosis not present

## 2016-11-14 DIAGNOSIS — R600 Localized edema: Secondary | ICD-10-CM

## 2016-11-14 LAB — POC URINALYSIS WITH MICROSCOPIC (NON AUTO)MANUAL RESULT
BILIRUBIN UA: NEGATIVE
Blood, UA: NEGATIVE
Crystals: 0
Epithelial cells, urine per micros: 3
GLUCOSE UA: NEGATIVE
Mucus, UA: 0
NITRITE UA: NEGATIVE
Protein, UA: 100
RBC: 2 M/uL — AB (ref 4.04–5.48)
Spec Grav, UA: 1.025
UROBILINOGEN UA: 0.2
WBC Casts, UA: 10
pH, UA: 6

## 2016-11-14 MED ORDER — CIPROFLOXACIN HCL 250 MG PO TABS: 250.0000 mg | ORAL_TABLET | Freq: Two times a day (BID) | ORAL | 0 refills | Status: DC

## 2016-11-14 NOTE — Progress Notes (Signed)
Date:  11/14/2016   Name:  Heather Long   DOB:  1928-11-10   MRN:  [id number]   Chief Complaint: Hypertension; Diabetes; and Abdominal Pain Hypertension  This is a chronic problem. The current episode started more than 1 year ago. The problem is unchanged. The problem is controlled. Pertinent negatives include no chest pain, headaches, palpitations or shortness of breath.  Diabetes  She presents for her follow-up diabetic visit. She has type 2 diabetes mellitus. Pertinent negatives for hypoglycemia include no headaches or tremors. Pertinent negatives for diabetes include no chest pain, no fatigue, no polydipsia and no polyuria.  Abdominal Pain  This is a new problem. The current episode started in the past 7 days. The onset quality is sudden. The problem occurs intermittently. The problem has been waxing and waning. The pain is located in the LLQ. The abdominal pain does not radiate. Associated symptoms include anorexia, a fever, flatus, frequency and nausea. Pertinent negatives include no arthralgias, diarrhea, dysuria, headaches, hematuria, melena or vomiting. The pain is aggravated by eating. She has tried nothing for the symptoms.  Back Pain  This is a chronic problem. The problem is unchanged. The pain is present in the lumbar spine. The quality of the pain is described as burning. Associated symptoms include abdominal pain and a fever. Pertinent negatives include no chest pain, dysuria, headaches or numbness. Treatments tried: Ortho recommended gabapentin but never took until I suggested increasing the dose.    Lab Results  Component Value Date   HGBA1C 6.8 (H) 09/18/2016   Lab Results  Component Value Date   CREATININE 1.31 (H) 05/02/2016     Review of Systems  Constitutional: Positive for fever. Negative for appetite change, fatigue and unexpected weight change.  HENT: Negative for tinnitus and trouble swallowing.   Eyes: Negative for visual disturbance.  Respiratory:  Negative for cough, chest tightness and shortness of breath.   Cardiovascular: Negative for chest pain, palpitations and leg swelling.  Gastrointestinal: Positive for abdominal pain, anorexia, flatus and nausea. Negative for diarrhea, melena and vomiting.  Endocrine: Negative for polydipsia and polyuria.  Genitourinary: Positive for frequency and urgency. Negative for dysuria and hematuria.  Musculoskeletal: Positive for back pain. Negative for arthralgias.  Skin: Negative for rash.  Neurological: Negative for tremors, numbness and headaches.  Psychiatric/Behavioral: Negative for dysphoric mood.    Patient Active Problem List   Diagnosis Date Noted  . Degeneration macular 05/23/2016  . Osteitis deformans 05/23/2016  . Neoplasm of uncertain behavior of skin of face 05/23/2016  . Psoriasis 05/23/2016  . Vitamin B12 deficiency 05/16/2016  . Obesity, Class III, BMI 40-49.9 (morbid obesity) ([place]) 05/02/2016  . Type 2 DM with CKD stage 4 and hypertension ([place]) 05/02/2016  . Vitamin D deficiency 05/02/2016  . Lower extremity edema 05/02/2016  . Spinal stenosis of lumbar region 05/02/2016  . Essential (primary) hypertension 06/02/2012  . Hyperlipidemia associated with type 2 diabetes mellitus ([place]) 06/02/2012  . Malignant neoplasm of breast ([place]) 06/14/2011  . Generalized OA 06/14/2011  . Lymphedema of arm 06/14/2011  . OP (osteoporosis) 06/14/2011    Prior to Admission medications   Medication Sig Start Date End Date Taking? Authorizing Provider  ACCU-CHEK AVIVA PLUS test strip U UTD TID 04/10/16  Yes Historical Provider, MD  Cholecalciferol 5000 units capsule Take 1 capsule (5,000 Units total) by mouth daily. 05/16/16  Yes Adline Potter, MD  insulin glargine (LANTUS) 100 UNIT/ML injection Inject 0.38 mLs (38 Units total) into the skin daily. 08/08/16  Yes Adline Potter, MD  LANTUS SOLOSTAR 100 UNIT/ML Solostar Pen Inject 38 Units into the skin daily. 10/23/16  Yes Historical Provider, MD    losartan (COZAAR) 25 MG tablet Take 1 tablet (25 mg total) by mouth daily. Reported on 05/02/2016 09/18/16  Yes Glean Hess, MD  vitamin B-12 (CYANOCOBALAMIN) 1000 MCG tablet Take 1 tablet (1,000 mcg total) by mouth daily. 05/16/16  Yes Adline Potter, MD  acetaminophen (TYLENOL) 325 MG tablet Take 650 mg by mouth every 6 (six) hours as needed.    Historical Provider, MD  gabapentin (NEURONTIN) 100 MG capsule Take 3 capsules (300 mg total) by mouth 2 (two) times daily. Patient not taking: Reported on 11/14/2016 09/18/16   Glean Hess, MD    Allergies  Allergen Reactions  . Gluten Meal Other (See Comments)    GI distress  . Pioglitazone Other (See Comments)    Leg swelling    Past Surgical History:  Procedure Laterality Date  . ABDOMINAL HYSTERECTOMY    . JOINT REPLACEMENT  2000, 2006  . ORIF WRIST FRACTURE  07/2016    Social History  Substance Use Topics  . Smoking status: Former Research scientist (life sciences)  . Smokeless tobacco: Never Used  . Alcohol use No     Medication list has been reviewed and updated.   Physical Exam  Constitutional: She is oriented to person, place, and time. She appears well-developed. No distress.  HENT:  Head: Normocephalic and atraumatic.  Neck: Normal range of motion. Neck supple.  Cardiovascular: Normal rate, regular rhythm and normal heart sounds.   Pulmonary/Chest: Effort normal and breath sounds normal. No respiratory distress.  Abdominal: Soft. There is tenderness in the epigastric area. There is no rigidity, no guarding and no CVA tenderness.  Musculoskeletal: Normal range of motion.  Neurological: She is alert and oriented to person, place, and time.  Skin: Skin is warm and dry. No rash noted.  Psychiatric: She has a normal mood and affect. Her behavior is normal. Thought content normal.  Nursing note and vitals reviewed.   BP 132/78   Pulse 78   Temp 97.4 F (36.3 C)   Ht 4\' 11"  (1.499 m)   Wt 231 lb (104.8 kg)   SpO2 96%   BMI 46.66  kg/m   Assessment and Plan: 1. Essential (primary) hypertension controlled  2. Type 2 DM with CKD stage 4 and hypertension (HCC) Continue current therapy Check BS 3-5 times per week - Hemoglobin A1c  3. Hyperlipidemia associated with type 2 diabetes mellitus ([place])  4. Vitamin B12 deficiency Check levels and supplement SQ if not improved on oral supplement - Vitamin B12  5. Vitamin D deficiency Now on 5000 IU daily - VITAMIN D 25 Hydroxy (Vit-D Deficiency, Fractures)  6. Lower extremity edema stable  7. Periumbilical abdominal pain Likely UTI - POC urinalysis w microscopic (non auto) - ciprofloxacin (CIPRO) 250 MG tablet; Take 1 tablet (250 mg total) by mouth 2 (two) times daily.  Dispense: 14 tablet; Refill: 0  8. Spinal stenosis of lumbar region without neurogenic claudication Followed by Rec[facility] Did not taper up gabapentin - took 3 bid immediately and felt depressed Discussed taper up - instructions given  Halina Maidens, MD [facility]  11/14/2016

## 2016-11-14 NOTE — Patient Instructions (Signed)
Week ! - one gabapentin at bedtime  Week 2 - one in the AM and one at bedtime  Week 3 - one in the AM and 2 at bedtime  Week 4 - 2 in the AM and 2 at  Bedtime  Week 5 - 2 in the AM and 3 at bedtime  Week 6 - 3 in the AM and 3 at bedtime - continue this dose if tolerated and beneficial

## 2016-11-15 ENCOUNTER — Other Ambulatory Visit: Payer: Self-pay | Admitting: Internal Medicine

## 2016-11-15 DIAGNOSIS — E559 Vitamin D deficiency, unspecified: Secondary | ICD-10-CM

## 2016-11-15 LAB — VITAMIN D 25 HYDROXY (VIT D DEFICIENCY, FRACTURES): Vit D, 25-Hydroxy: 18.8 ng/mL — ABNORMAL LOW (ref 30.0–100.0)

## 2016-11-15 LAB — HEMOGLOBIN A1C
ESTIMATED AVERAGE GLUCOSE: 157 mg/dL
HEMOGLOBIN A1C: 7.1 % — AB (ref 4.8–5.6)

## 2016-11-15 LAB — VITAMIN B12: Vitamin B-12: 326 pg/mL (ref 232–1245)

## 2016-11-15 MED ORDER — VITAMIN D (ERGOCALCIFEROL) 1.25 MG (50000 UNIT) PO CAPS: 50000.0000 [IU] | ORAL_CAPSULE | ORAL | 3 refills | Status: DC

## 2016-11-16 ENCOUNTER — Other Ambulatory Visit: Payer: Self-pay | Admitting: Internal Medicine

## 2016-11-16 LAB — URINE CULTURE

## 2016-12-04 ENCOUNTER — Other Ambulatory Visit: Payer: Self-pay | Admitting: Internal Medicine

## 2016-12-04 DIAGNOSIS — E559 Vitamin D deficiency, unspecified: Secondary | ICD-10-CM

## 2016-12-04 MED ORDER — VITAMIN D (ERGOCALCIFEROL) 1.25 MG (50000 UNIT) PO CAPS: 50000.0000 [IU] | ORAL_CAPSULE | ORAL | 3 refills | Status: DC

## 2016-12-17 ENCOUNTER — Ambulatory Visit (INDEPENDENT_AMBULATORY_CARE_PROVIDER_SITE_OTHER): Payer: Medicare Other | Admitting: Internal Medicine

## 2016-12-17 ENCOUNTER — Encounter: Payer: Self-pay | Admitting: Internal Medicine

## 2016-12-17 VITALS — BP 160/82 | HR 88 | Temp 97.7°F | Ht 59.0 in | Wt 234.0 lb

## 2016-12-17 DIAGNOSIS — I129 Hypertensive chronic kidney disease with stage 1 through stage 4 chronic kidney disease, or unspecified chronic kidney disease: Secondary | ICD-10-CM

## 2016-12-17 DIAGNOSIS — B351 Tinea unguium: Secondary | ICD-10-CM | POA: Diagnosis not present

## 2016-12-17 DIAGNOSIS — N184 Chronic kidney disease, stage 4 (severe): Secondary | ICD-10-CM

## 2016-12-17 DIAGNOSIS — R197 Diarrhea, unspecified: Secondary | ICD-10-CM

## 2016-12-17 DIAGNOSIS — D2371 Other benign neoplasm of skin of right lower limb, including hip: Secondary | ICD-10-CM | POA: Diagnosis not present

## 2016-12-17 DIAGNOSIS — E1122 Type 2 diabetes mellitus with diabetic chronic kidney disease: Secondary | ICD-10-CM | POA: Diagnosis not present

## 2016-12-17 DIAGNOSIS — E1042 Type 1 diabetes mellitus with diabetic polyneuropathy: Secondary | ICD-10-CM | POA: Diagnosis not present

## 2016-12-17 NOTE — Progress Notes (Signed)
Date:  12/17/2016   Name:  Heather Long   DOB:  [birth year]   MRN:  [id number]   Chief Complaint: Diarrhea (pt stated having diarrhea for more then 1 month) Diarrhea   This is a new problem. The current episode started more than 1 month ago. The problem occurs 2 to 4 times per day. The problem has been waxing and waning. The patient states that diarrhea awakens her from sleep. Associated symptoms include arthralgias. Pertinent negatives include no abdominal pain, chills, fever, vomiting or weight loss.  She states that she has a loose stool about 2 hours after every meal.  First she says the past month but then says it has been going on for months.  No new medications. She is frustrated about her weight.  She continues to eat and gain.    Lab Results  Component Value Date   HGBA1C 7.1 (H) 11/14/2016     Review of Systems  Constitutional: Negative for chills, fatigue, fever and weight loss.  Respiratory: Negative for chest tightness, shortness of breath and wheezing.   Cardiovascular: Negative for chest pain, palpitations and leg swelling.  Gastrointestinal: Positive for diarrhea. Negative for abdominal pain, blood in stool, nausea and vomiting.  Genitourinary: Negative for dysuria.  Musculoskeletal: Positive for arthralgias and gait problem.    Patient Active Problem List   Diagnosis Date Noted  . Degeneration macular 05/23/2016  . Osteitis deformans 05/23/2016  . Neoplasm of uncertain behavior of skin of face 05/23/2016  . Psoriasis 05/23/2016  . Vitamin B12 deficiency 05/16/2016  . Obesity, Class III, BMI 40-49.9 (morbid obesity) ([place]) 05/02/2016  . Type 2 DM with CKD stage 4 and hypertension ([place]) 05/02/2016  . Vitamin D deficiency 05/02/2016  . Lower extremity edema 05/02/2016  . Spinal stenosis of lumbar region 05/02/2016  . Essential (primary) hypertension 06/02/2012  . Hyperlipidemia associated with type 2 diabetes mellitus ([place]) 06/02/2012  . Malignant neoplasm  of breast ([place]) 06/14/2011  . Generalized OA 06/14/2011  . Lymphedema of arm 06/14/2011  . OP (osteoporosis) 06/14/2011    Prior to Admission medications   Medication Sig Start Date End Date Taking? Authorizing Provider  ACCU-CHEK AVIVA PLUS test strip USE AS DIRECTED THREE TIMES DAILY 11/16/16  Yes Glean Hess, MD  acetaminophen (TYLENOL) 325 MG tablet Take 650 mg by mouth every 6 (six) hours as needed.   Yes Historical Provider, MD  Cholecalciferol 5000 units capsule Take 1 capsule (5,000 Units total) by mouth daily. 05/16/16  Yes Adline Potter, MD  insulin glargine (LANTUS) 100 UNIT/ML injection Inject 0.38 mLs (38 Units total) into the skin daily. 08/08/16  Yes Adline Potter, MD  LANTUS SOLOSTAR 100 UNIT/ML Solostar Pen Inject 38 Units into the skin daily. 10/23/16  Yes Historical Provider, MD  losartan (COZAAR) 25 MG tablet Take 1 tablet (25 mg total) by mouth daily. Reported on 05/02/2016 09/18/16  Yes Glean Hess, MD  vitamin B-12 (CYANOCOBALAMIN) 1000 MCG tablet Take 1 tablet (1,000 mcg total) by mouth daily. 05/16/16  Yes Adline Potter, MD  Vitamin D, Ergocalciferol, (DRISDOL) 50000 units CAPS capsule Take 1 capsule (50,000 Units total) by mouth every 7 (seven) days. 12/04/16  Yes Glean Hess, MD  gabapentin (NEURONTIN) 100 MG capsule  09/18/16   Historical Provider, MD    Allergies  Allergen Reactions  . Gluten Meal Other (See Comments)    GI distress  . Pioglitazone Other (See Comments)    Leg swelling    Past Surgical History:  Procedure Laterality Date  . ABDOMINAL HYSTERECTOMY    . JOINT REPLACEMENT  2000, 2006  . ORIF WRIST FRACTURE  07/2016    Social History  Substance Use Topics  . Smoking status: Former Research scientist (life sciences)  . Smokeless tobacco: Never Used  . Alcohol use No     Medication list has been reviewed and updated.   Physical Exam  Constitutional: She is oriented to person, place, and time. She appears well-developed. No distress.  HENT:  Head:  Normocephalic and atraumatic.  Neck: Normal range of motion.  Cardiovascular: Normal rate, regular rhythm and normal heart sounds.   Pulmonary/Chest: Effort normal and breath sounds normal. No respiratory distress. She has no wheezes.  Abdominal: Soft. Bowel sounds are normal. She exhibits no mass. There is no tenderness.  Musculoskeletal: Normal range of motion.  Neurological: She is alert and oriented to person, place, and time.  Skin: Skin is warm and dry. No rash noted.  Psychiatric: She has a normal mood and affect. Her behavior is normal. Thought content normal.  Nursing note and vitals reviewed.   BP (!) 160/82   Pulse 88   Temp 97.7 F (36.5 C)   Ht 4\' 11"  (1.499 m)   Wt 234 lb (106.1 kg)   SpO2 97%   BMI 47.26 kg/m   Assessment and Plan: 1. Diarrhea, unspecified type Sounds chronic but will get labs to see if further work up is needed - CBC with Differential/Platelet - Lipase  2. Type 2 DM with CKD stage 4 and hypertension ([place]) Needs direction with diet  - Comprehensive metabolic panel - Ambulatory referral to diabetic education   Halina Maidens, MD [facility]  12/17/2016

## 2016-12-17 NOTE — Patient Instructions (Signed)
Begin a Probiotic medication daily - ask your pharmacist for a recommendation -

## 2016-12-18 LAB — CBC WITH DIFFERENTIAL/PLATELET
BASOS: 1 %
Basophils Absolute: 0 10*3/uL (ref 0.0–0.2)
EOS (ABSOLUTE): 0.2 10*3/uL (ref 0.0–0.4)
Eos: 3 %
Hematocrit: 39.3 % (ref 34.0–46.6)
Hemoglobin: 12.9 g/dL (ref 11.1–15.9)
IMMATURE GRANULOCYTES: 0 %
Immature Grans (Abs): 0 10*3/uL (ref 0.0–0.1)
LYMPHS ABS: 1.7 10*3/uL (ref 0.7–3.1)
Lymphs: 32 %
MCH: 27.2 pg (ref 26.6–33.0)
MCHC: 32.8 g/dL (ref 31.5–35.7)
MCV: 83 fL (ref 79–97)
MONOS ABS: 0.3 10*3/uL (ref 0.1–0.9)
Monocytes: 6 %
NEUTROS ABS: 3.1 10*3/uL (ref 1.4–7.0)
NEUTROS PCT: 58 %
PLATELETS: 171 10*3/uL (ref 150–379)
RBC: 4.75 x10E6/uL (ref 3.77–5.28)
RDW: 15.2 % (ref 12.3–15.4)
WBC: 5.4 10*3/uL (ref 3.4–10.8)

## 2016-12-18 LAB — LIPASE: Lipase: 17 U/L (ref 14–85)

## 2016-12-18 LAB — COMPREHENSIVE METABOLIC PANEL
A/G RATIO: 1.9 (ref 1.2–2.2)
ALBUMIN: 4.1 g/dL (ref 3.5–4.7)
ALT: 14 IU/L (ref 0–32)
AST: 14 IU/L (ref 0–40)
Alkaline Phosphatase: 75 IU/L (ref 39–117)
BUN / CREAT RATIO: 22 (ref 12–28)
BUN: 24 mg/dL (ref 8–27)
Bilirubin Total: <0.2 mg/dL (ref 0.0–1.2)
CALCIUM: 9.6 mg/dL (ref 8.7–10.3)
CO2: 22 mmol/L (ref 18–29)
Chloride: 104 mmol/L (ref 96–106)
Creatinine, Ser: 1.08 mg/dL — ABNORMAL HIGH (ref 0.57–1.00)
GFR calc Af Amer: 54 mL/min/{1.73_m2} — ABNORMAL LOW (ref >59)
GFR, EST NON AFRICAN AMERICAN: 47 mL/min/{1.73_m2} — AB (ref >59)
GLOBULIN, TOTAL: 2.2 g/dL (ref 1.5–4.5)
Glucose: 140 mg/dL — ABNORMAL HIGH (ref 65–99)
POTASSIUM: 4.9 mmol/L (ref 3.5–5.2)
SODIUM: 143 mmol/L (ref 134–144)
Total Protein: 6.3 g/dL (ref 6.0–8.5)

## 2016-12-21 DIAGNOSIS — H353112 Nonexudative age-related macular degeneration, right eye, intermediate dry stage: Secondary | ICD-10-CM | POA: Diagnosis not present

## 2016-12-26 DIAGNOSIS — G5601 Carpal tunnel syndrome, right upper limb: Secondary | ICD-10-CM | POA: Diagnosis not present

## 2016-12-26 DIAGNOSIS — M19041 Primary osteoarthritis, right hand: Secondary | ICD-10-CM | POA: Diagnosis not present

## 2016-12-26 DIAGNOSIS — S52531A Colles' fracture of right radius, initial encounter for closed fracture: Secondary | ICD-10-CM | POA: Diagnosis not present

## 2017-01-01 ENCOUNTER — Encounter: Payer: Medicare Other | Attending: Internal Medicine | Admitting: Dietician

## 2017-01-01 ENCOUNTER — Encounter: Payer: Self-pay | Admitting: Dietician

## 2017-01-01 VITALS — Ht 59.0 in | Wt 231.9 lb

## 2017-01-01 DIAGNOSIS — N184 Chronic kidney disease, stage 4 (severe): Secondary | ICD-10-CM | POA: Insufficient documentation

## 2017-01-01 DIAGNOSIS — E1122 Type 2 diabetes mellitus with diabetic chronic kidney disease: Secondary | ICD-10-CM | POA: Diagnosis not present

## 2017-01-01 DIAGNOSIS — Z794 Long term (current) use of insulin: Secondary | ICD-10-CM

## 2017-01-01 DIAGNOSIS — Z713 Dietary counseling and surveillance: Secondary | ICD-10-CM | POA: Insufficient documentation

## 2017-01-01 DIAGNOSIS — IMO0001 Reserved for inherently not codable concepts without codable children: Secondary | ICD-10-CM

## 2017-01-01 DIAGNOSIS — I129 Hypertensive chronic kidney disease with stage 1 through stage 4 chronic kidney disease, or unspecified chronic kidney disease: Secondary | ICD-10-CM | POA: Diagnosis not present

## 2017-01-01 NOTE — Patient Instructions (Addendum)
   Have something nutritious between breakfast and dinner; try a low sugar protein drink such as Glucerna or Boost sugar free, or Premier Protein.   If not a drink, have a snack with protein and carbohydrate such as a sandwich on gluten free bread, or yogurt and fruit or cheese and fruit or rice cakes and peanut butter.   Include a small snack at bedtime, fruit and nuts or yogurt, or 3 cups popcorn--make sure to chew very thoroughly.

## 2017-01-01 NOTE — Progress Notes (Signed)
Medical Nutrition Therapy: Visit start time: 6294  end time: 1145  Assessment:  Diagnosis: obesity, diabetes, CKD stage 4 Past medical history: HTN, gout, lymphedema, osteoporosis, Paget's disease Psychosocial issues/ stress concerns: depression Preferred learning method:  . Auditory . Visual  Current weight: 231.9lbs with shoes  Height: 4'11" Medications, supplements: reconciled medication list in medical record  Progress and evaluation: Patient reports gradual weight fluctuation according to pain level and mood. Lost significant weight on liquid diet years ago, and enjoyed that diet. She is testing BGs at home, and reports recent results of 130s - 150s fasting. She states she frequently eats out of boredom and/or depression, at times feels depressed due to chronic pain which is limiting her mobility and socialization. She will be joining a Dance movement psychotherapist this month. She reports a gluten intolerance and is avoiding wheat and other gluten sources.    Physical activity: none due to pain  Dietary Intake:  Usual eating pattern includes 2 meals and 2 snacks per day. Dining out frequency: 0-1 meals per week.  Breakfast: toast with small amt butter, 2 eggs, banana, coffee. Likes and tolerates yogurt Snack: none or fruit or potato chips Lunch: none Snack: chips, fruit (orange) Supper: 4-6pm kale salad with berries and nuts 3x a week. Other days chicken-- eats gradually, sometimes with rice or gluten free pasta Snack: usually none, sometimes baby carrots Beverages: water, tea or coffee, unsweetened  Nutrition Care Education: Topics covered: weight control, diabetes, CKD Basic nutrition: basic food groups, appropriate nutrient balance, appropriate meal and snack schedule    Weight control: benefits of weight control, basic meal planning using plate method, portion control Diabetes: appropriate meal and snack schedule, appropriate carb intake and balance, importance of eating at regular intervals  for blood sugar as well as weight control.  Kidney disease: importance of adequate but not overeating protein sources, limiting sodium  Nutritional Diagnosis:  [place]-2.2 Altered nutrition-related laboratory As related to diabetes, CKD.  As evidenced by lab report, patient report. [place]-1.4 Altered GI function As related to gluten intolerance.  As evidenced by patient report. -3.3 Overweight/obesity As related to excess calories, inactivity.  As evidenced by patient report.  Intervention: Instruction as noted above.   Set goals with patient input, to improve eating pattern and better control hunger.    Will continue instruction on DM and CKD as well as other digestive issues at follow-up visit.   Education Materials given:  . Plate Planner . Food lists/ Planning A Balanced Meal . Sample meal pattern/ menus . Goals/ instructions  Learner/ who was taught:  . Patient   Level of understanding: Marland Kitchen Verbalizes/ demonstrates competency  Demonstrated degree of understanding via:   Teach back Learning barriers: . None  Willingness to learn/ readiness for change: . Acceptance, ready for change  Monitoring and Evaluation:  Dietary intake, exercise, BG control, renal function, and body weight      follow up: 01/29/17

## 2017-01-18 ENCOUNTER — Other Ambulatory Visit: Payer: Self-pay | Admitting: Internal Medicine

## 2017-01-18 DIAGNOSIS — I10 Essential (primary) hypertension: Secondary | ICD-10-CM

## 2017-01-29 ENCOUNTER — Ambulatory Visit: Payer: Medicare Other | Admitting: Dietician

## 2017-02-13 ENCOUNTER — Encounter: Payer: Self-pay | Admitting: Dietician

## 2017-02-13 ENCOUNTER — Encounter: Payer: Medicare Other | Attending: Internal Medicine | Admitting: Dietician

## 2017-02-13 VITALS — Ht 59.0 in | Wt 232.2 lb

## 2017-02-13 DIAGNOSIS — Z713 Dietary counseling and surveillance: Secondary | ICD-10-CM | POA: Insufficient documentation

## 2017-02-13 DIAGNOSIS — Z794 Long term (current) use of insulin: Secondary | ICD-10-CM

## 2017-02-13 DIAGNOSIS — I129 Hypertensive chronic kidney disease with stage 1 through stage 4 chronic kidney disease, or unspecified chronic kidney disease: Secondary | ICD-10-CM | POA: Insufficient documentation

## 2017-02-13 DIAGNOSIS — E1122 Type 2 diabetes mellitus with diabetic chronic kidney disease: Secondary | ICD-10-CM | POA: Diagnosis not present

## 2017-02-13 DIAGNOSIS — IMO0001 Reserved for inherently not codable concepts without codable children: Secondary | ICD-10-CM

## 2017-02-13 DIAGNOSIS — N184 Chronic kidney disease, stage 4 (severe): Secondary | ICD-10-CM | POA: Diagnosis not present

## 2017-02-13 NOTE — Progress Notes (Signed)
Medical Nutrition Therapy: Visit start time: 4462  end time: 1100  Assessment:  Diagnosis: Diabetes, CKD stage 4 Medical history changes: no changes Psychosocial issues/ stress concerns: patient reports symptoms of depression  Current weight: 232.2lbs with shoes  Height: 4'11" Medications, supplement changes: no changes per patient  Progress and evaluation: patient reports better eating habits for a while, but has recently had more depression which has led to some overeating. She feels depressed due to not having anyone to talk with who would understand her situation. Son tries to help but patient states he only tries to fix problems rather than listen and empathize. She reports wanting to do things such as house chores, or walking outdoors, but can't seem to get enough motivation to complete tasks. She brought record of fasting BG readings, which are ranging 85-171. Reading of 85 was during the night when she awoke feeling hungry and shaky-- took glucose tabs and then resumed sleeping.    Physical activity: none  Dietary Intake:  Usual eating pattern includes 2 meals and 1-2 snacks per day. Dining out frequency: not assessed today.  Breakfast: 2 slices thin bread with chicken Low sodium, small amount butter, orange (sm), coffee; grits with banana Snack: sometimes fruit or crackers Lunch/ supper: 4pm cut vegetables, leftover chicken or eggs, sometimes pork chop or burger and rice,and sweet potato Snack: 6pm-- crackers fruit Beverages: water, coffee in am  Nutrition Care Education: Topics covered: diabetes    Diabetes:  goals for BGs, appropriate meal and snack schedule Other lifestyle changes:  Discussed effects of depression on eating habits, and ability to make positive changes. Discussed options for help in dealing with depression, and making a priority to treat depression in order to then make other lifestyle improvements.   Nutritional Diagnosis:  [place]-2.2 Altered nutrition-related  laboratory As related to diabetes, CKD.  As evidenced by lab report, patient report. [place]-3.3 Overweight/obesity As related to excess calories, inactivity.  As evidenced by patient report.  Intervention: Instruction/ discussion as noted above.   Time spent mostly on dealing with depression, scheduled appointment with [facility] counselor as part of MNT program.    Patient reported feeling more positive at end of visit.   Education Materials given:  Marland Kitchen Goals/ instructions   Learner/ who was taught:  . Patient   Level of understanding: Marland Kitchen Verbalizes/ demonstrates competency   Demonstrated degree of understanding via:   Teach back Learning barriers: . None  Willingness to learn/ readiness for change: . Acceptance, ready for change  Monitoring and Evaluation:  Dietary intake, exercise, BG control and renal function, and body weight      follow up: 03/19/17

## 2017-02-13 NOTE — Patient Instructions (Signed)
   Have something to eat every 4-5 hours during the day, and be sure to eat a good snack or small meal in the evening that includes a protein food such as nuts, peanut butter, lean meat, egg, or low fat cheese.   Start some short walks outside as the weather permits, using your walker to avoid falls.   Meet with Ms. Marshell Levan on Tuesday, April 3rd.   Call Pam at [phone] if you have any questions.

## 2017-03-13 ENCOUNTER — Encounter: Payer: Self-pay | Admitting: Internal Medicine

## 2017-03-13 ENCOUNTER — Ambulatory Visit (INDEPENDENT_AMBULATORY_CARE_PROVIDER_SITE_OTHER): Payer: Medicare Other | Admitting: Internal Medicine

## 2017-03-13 VITALS — BP 132/70 | HR 68 | Ht 59.0 in | Wt 238.6 lb

## 2017-03-13 DIAGNOSIS — E785 Hyperlipidemia, unspecified: Secondary | ICD-10-CM

## 2017-03-13 DIAGNOSIS — E1122 Type 2 diabetes mellitus with diabetic chronic kidney disease: Secondary | ICD-10-CM | POA: Diagnosis not present

## 2017-03-13 DIAGNOSIS — M170 Bilateral primary osteoarthritis of knee: Secondary | ICD-10-CM

## 2017-03-13 DIAGNOSIS — I129 Hypertensive chronic kidney disease with stage 1 through stage 4 chronic kidney disease, or unspecified chronic kidney disease: Secondary | ICD-10-CM

## 2017-03-13 DIAGNOSIS — N184 Chronic kidney disease, stage 4 (severe): Secondary | ICD-10-CM

## 2017-03-13 DIAGNOSIS — J329 Chronic sinusitis, unspecified: Secondary | ICD-10-CM | POA: Insufficient documentation

## 2017-03-13 DIAGNOSIS — E1169 Type 2 diabetes mellitus with other specified complication: Secondary | ICD-10-CM | POA: Diagnosis not present

## 2017-03-13 DIAGNOSIS — R600 Localized edema: Secondary | ICD-10-CM

## 2017-03-13 DIAGNOSIS — I10 Essential (primary) hypertension: Secondary | ICD-10-CM | POA: Diagnosis not present

## 2017-03-13 NOTE — Progress Notes (Signed)
Date:  03/13/2017   Name:  Heather Long   DOB:  18-Mar-1928   MRN:  [id number]   Chief Complaint: Hypertension and Diabetes (This morning- 115 ) Hypertension  This is a chronic problem. The problem is controlled. Pertinent negatives include no chest pain, palpitations or shortness of breath. Risk factors for coronary artery disease include diabetes mellitus. Past treatments include ACE inhibitors.  Diabetes  She presents for her follow-up diabetic visit. She has type 2 diabetes mellitus. Pertinent negatives for hypoglycemia include no dizziness. Pertinent negatives for diabetes include no chest pain and no fatigue.  Back and knee pain - seen by Ortho for back last year.  Could not take gabapentin.  Had some tylenol #3.   Now having more knee pain and stiffness in both knees despite BTKR.  She has not seen ortho for this problem recently.  Today she is walking with a cane.  Lab Results  Component Value Date   HGBA1C 7.1 (H) 11/14/2016   Lab Results  Component Value Date   CREATININE 1.08 (H) 12/17/2016     Review of Systems  Constitutional: Negative for chills and fatigue.  HENT: Negative for trouble swallowing.   Eyes: Negative for visual disturbance.  Respiratory: Negative for chest tightness, shortness of breath and wheezing.   Cardiovascular: Negative for chest pain, palpitations and leg swelling.  Genitourinary: Negative for dysuria.  Musculoskeletal: Positive for arthralgias, gait problem and joint swelling.  Neurological: Negative for dizziness and syncope.  Psychiatric/Behavioral: Negative for sleep disturbance.    Patient Active Problem List   Diagnosis Date Noted  . Degeneration macular 05/23/2016  . Osteitis deformans 05/23/2016  . Neoplasm of uncertain behavior of skin of face 05/23/2016  . Psoriasis 05/23/2016  . Vitamin B12 deficiency 05/16/2016  . Obesity, Class III, BMI 40-49.9 (morbid obesity) ([place]) 05/02/2016  . Type 2 DM with CKD stage 4 and  hypertension ([place]) 05/02/2016  . Vitamin D deficiency 05/02/2016  . Lower extremity edema 05/02/2016  . Spinal stenosis of lumbar region 05/02/2016  . Essential (primary) hypertension 06/02/2012  . Hyperlipidemia associated with type 2 diabetes mellitus ([place]) 06/02/2012  . Malignant neoplasm of breast ([place]) 06/14/2011  . Generalized OA 06/14/2011  . Lymphedema of arm 06/14/2011  . OP (osteoporosis) 06/14/2011    Prior to Admission medications   Medication Sig Start Date End Date Taking? Authorizing Provider  ACCU-CHEK AVIVA PLUS test strip USE AS DIRECTED THREE TIMES DAILY 11/16/16  Yes Glean Hess, MD  acetaminophen (TYLENOL) 325 MG tablet Take 650 mg by mouth every 6 (six) hours as needed.   Yes Historical Provider, MD  acetaminophen-codeine (TYLENOL #3) 300-30 MG tablet  12/26/16  Yes Historical Provider, MD  Cholecalciferol 5000 units capsule Take 1 capsule (5,000 Units total) by mouth daily. 05/16/16  Yes Adline Potter, MD  diclofenac sodium (VOLTAREN) 1 % GEL  12/27/16  Yes Historical Provider, MD  insulin glargine (LANTUS) 100 UNIT/ML injection Inject 0.38 mLs (38 Units total) into the skin daily. 08/08/16  Yes Adline Potter, MD  LANTUS SOLOSTAR 100 UNIT/ML Solostar Pen Inject 32 Units into the skin daily.  10/23/16  Yes Historical Provider, MD  losartan (COZAAR) 25 MG tablet TAKE 1 TABLET BY MOUTH DAILY 01/18/17  Yes Glean Hess, MD  vitamin B-12 (CYANOCOBALAMIN) 1000 MCG tablet Take 1 tablet (1,000 mcg total) by mouth daily. 05/16/16  Yes Adline Potter, MD  Vitamin D, Ergocalciferol, (DRISDOL) 50000 units CAPS capsule Take 1 capsule (50,000 Units total) by mouth  every 7 (seven) days. 12/04/16  Yes Glean Hess, MD    Allergies  Allergen Reactions  . Ciprofloxacin Nausea And Vomiting  . Gluten Meal Other (See Comments)    GI distress  . Pioglitazone Other (See Comments)    Leg swelling  . Gabapentin Diarrhea, Nausea Only and Other (See Comments)    dizziness    Past  Surgical History:  Procedure Laterality Date  . ABDOMINAL HYSTERECTOMY    . JOINT REPLACEMENT  2000, 2006  . ORIF WRIST FRACTURE  07/2016    Social History  Substance Use Topics  . Smoking status: Former Smoker    Quit date: 01/02/1952  . Smokeless tobacco: Never Used  . Alcohol use No     Medication list has been reviewed and updated.   Physical Exam  Constitutional: She is oriented to person, place, and time. She appears well-developed. No distress.  HENT:  Head: Normocephalic and atraumatic.  Neck: Normal range of motion. Neck supple.  Cardiovascular: Normal rate, regular rhythm and normal heart sounds.   1+ edema both lower legs  Pulmonary/Chest: Effort normal and breath sounds normal. No respiratory distress.  Abdominal: Bowel sounds are normal.  Musculoskeletal:       Right knee: She exhibits decreased range of motion. She exhibits no swelling and no effusion. Tenderness found.       Left knee: She exhibits decreased range of motion. She exhibits no swelling and no effusion. Tenderness found.  Neurological: She is alert and oriented to person, place, and time.  Skin: Skin is warm and dry. No rash noted.  Psychiatric: She has a normal mood and affect. Her speech is normal and behavior is normal. Thought content normal.  Nursing note and vitals reviewed.   BP 132/70 (BP Location: Right Arm, Patient Position: Sitting, Cuff Size: Large)   Pulse 68   Ht 4\' 11"  (1.499 m)   Wt 238 lb 9.6 oz (108.2 kg)   SpO2 99%   BMI 48.19 kg/m   Assessment and Plan: 1. Essential (primary) hypertension controlled  2. Type 2 DM with CKD stage 4 and hypertension (HCC) Continue insulin - may need to increase dose if A1C > 7.5 - Hemoglobin A1c  3. Hyperlipidemia associated with type 2 diabetes mellitus ([place]) Not on statin therapy due to age  39. Lower extremity edema Chronic, stable  5. Primary osteoarthritis of both knees Needs Ortho eval since bilateral TKR - Ambulatory  referral to Orthopedic Surgery   No orders of the defined types were placed in this encounter.   Halina Maidens, MD [facility]  03/13/2017

## 2017-03-14 DIAGNOSIS — Z96659 Presence of unspecified artificial knee joint: Secondary | ICD-10-CM | POA: Diagnosis not present

## 2017-03-14 LAB — HEMOGLOBIN A1C
ESTIMATED AVERAGE GLUCOSE: 157 mg/dL
HEMOGLOBIN A1C: 7.1 % — AB (ref 4.8–5.6)

## 2017-03-18 ENCOUNTER — Encounter: Payer: Self-pay | Admitting: Dietician

## 2017-03-18 NOTE — Progress Notes (Signed)
Patient cancelled appt for 03/19/17, does not want to reschedule at this time, she is unable to work on diet due to other issues at this time. Sent letter to MD.

## 2017-03-19 ENCOUNTER — Ambulatory Visit: Payer: Medicare Other | Admitting: Dietician

## 2017-03-29 ENCOUNTER — Encounter: Payer: Self-pay | Admitting: Dietician

## 2017-03-29 NOTE — Progress Notes (Signed)
Patient cancelled her appointment for 03/19/17, and was unable to reschedule due to multiple other appointments. Sent discharge letter to MD.

## 2017-04-03 ENCOUNTER — Other Ambulatory Visit: Payer: Self-pay | Admitting: Internal Medicine

## 2017-04-03 DIAGNOSIS — I10 Essential (primary) hypertension: Secondary | ICD-10-CM

## 2017-04-19 ENCOUNTER — Ambulatory Visit: Payer: Medicare Other | Admitting: Dietician

## 2017-04-25 ENCOUNTER — Ambulatory Visit (INDEPENDENT_AMBULATORY_CARE_PROVIDER_SITE_OTHER): Payer: Medicare Other | Admitting: Internal Medicine

## 2017-04-25 ENCOUNTER — Encounter: Payer: Self-pay | Admitting: Internal Medicine

## 2017-04-25 VITALS — BP 118/74 | HR 85 | Ht 59.0 in | Wt 227.0 lb

## 2017-04-25 DIAGNOSIS — I10 Essential (primary) hypertension: Secondary | ICD-10-CM | POA: Diagnosis not present

## 2017-04-25 DIAGNOSIS — J3089 Other allergic rhinitis: Secondary | ICD-10-CM

## 2017-04-25 NOTE — Progress Notes (Signed)
Date:  04/25/2017   Name:  Heather Long   DOB:  [birth year]   MRN:  [id number]   Chief Complaint: Hypertension Hypertension  This is a chronic problem. The problem is controlled. Pertinent negatives include no chest pain, palpitations or shortness of breath. There are no compliance problems.    Seen by insurance nurse for home visit.  Everything went well, no problems identified but she told Ms. Alred that she should consider a Home Health referral.  With discussion, no medical needs are identified.  Pt can do all her ADLs, laundry, cooking and light cleaning.  She may need to hire someone to do heavier house cleaning.    Eye sx - having watery eyes with some crusting, sneezing and runny nose.  No sore throat.  Not taking any medications for this.   Review of Systems  Constitutional: Negative for chills, fatigue and fever.  HENT: Positive for postnasal drip and sneezing. Negative for congestion and sinus pain.   Eyes: Positive for discharge and itching.  Respiratory: Negative for cough, chest tightness, shortness of breath and wheezing.   Cardiovascular: Negative for chest pain, palpitations and leg swelling.  Gastrointestinal: Negative for abdominal pain.  Musculoskeletal: Positive for arthralgias and gait problem.  Skin: Negative for color change.    Patient Active Problem List   Diagnosis Date Noted  . Acute inflammation of sinus 03/13/2017  . Degeneration macular 05/23/2016  . Osteitis deformans 05/23/2016  . Neoplasm of uncertain behavior of skin of face 05/23/2016  . Psoriasis 05/23/2016  . Vitamin B12 deficiency 05/16/2016  . Obesity, Class III, BMI 40-49.9 (morbid obesity) ([place]) 05/02/2016  . Type 2 DM with CKD stage 4 and hypertension ([place]) 05/02/2016  . Vitamin D deficiency 05/02/2016  . Lower extremity edema 05/02/2016  . Spinal stenosis of lumbar region 05/02/2016  . Essential (primary) hypertension 06/02/2012  . Hyperlipidemia associated with type 2  diabetes mellitus ([place]) 06/02/2012  . Malignant neoplasm of breast ([place]) 06/14/2011  . Generalized OA 06/14/2011  . Lymphedema of arm 06/14/2011  . OP (osteoporosis) 06/14/2011    Prior to Admission medications   Medication Sig Start Date End Date Taking? Authorizing Provider  ACCU-CHEK AVIVA PLUS test strip USE AS DIRECTED THREE TIMES DAILY 11/16/16  Yes Glean Hess, MD  acetaminophen (TYLENOL) 325 MG tablet Take 650 mg by mouth every 6 (six) hours as needed.   Yes [provider]  Ascorbic Acid (VITAMIN C) 1000 MG tablet Take 1,000 mg by mouth daily.   Yes [provider]  Cholecalciferol 5000 units capsule Take 1 capsule (5,000 Units total) by mouth daily. 05/16/16  Yes Plonk, Gwyndolyn Saxon, MD  diclofenac sodium (VOLTAREN) 1 % GEL  12/27/16  Yes [provider]  LANTUS SOLOSTAR 100 UNIT/ML Solostar Pen Inject 34 Units into the skin daily at 10 pm.  02/05/17  Yes [provider]  losartan (COZAAR) 25 MG tablet TAKE 1 TABLET BY MOUTH DAILY 04/03/17  Yes Glean Hess, MD  vitamin B-12 (CYANOCOBALAMIN) 1000 MCG tablet Take 1 tablet (1,000 mcg total) by mouth daily. 05/16/16  Yes Plonk, Gwyndolyn Saxon, MD  Vitamin D, Ergocalciferol, (DRISDOL) 50000 units CAPS capsule Take 1 capsule (50,000 Units total) by mouth every 7 (seven) days. 12/04/16  Yes Glean Hess, MD  acetaminophen-codeine (TYLENOL #3) 300-30 MG tablet  12/26/16   [provider]    Allergies  Allergen Reactions  . Ciprofloxacin Nausea And Vomiting  . Gluten Meal Other (See Comments)  GI distress  . Pioglitazone Other (See Comments)    Leg swelling  . Gabapentin Diarrhea, Nausea Only and Other (See Comments)    dizziness    Past Surgical History:  Procedure Laterality Date  . ABDOMINAL HYSTERECTOMY    . JOINT REPLACEMENT  2000, 2006  . ORIF WRIST FRACTURE  07/2016    Social History  Substance Use Topics  . Smoking status: Former Smoker    Quit date: 01/02/1952  . Smokeless  tobacco: Never Used  . Alcohol use No    Medication list has been reviewed and updated.   Physical Exam  Constitutional: She is oriented to person, place, and time. She appears well-developed. No distress.  HENT:  Head: Normocephalic and atraumatic.  Eyes:  Clear drainage from both eyes; minimal crusting  Neck: Normal range of motion. Neck supple.  Cardiovascular: Normal rate, regular rhythm and normal heart sounds.   Pulmonary/Chest: Effort normal and breath sounds normal. No respiratory distress. She has no wheezes.  Abdominal: Soft.  Musculoskeletal: Normal range of motion. She exhibits edema. She exhibits no tenderness.  Neurological: She is alert and oriented to person, place, and time.  Skin: Skin is warm and dry. No rash noted.  Psychiatric: She has a normal mood and affect. Her behavior is normal. Thought content normal.  Nursing note and vitals reviewed.   BP 118/74   Pulse 85   Ht 4\' 11"  (1.499 m)   Wt 227 lb (103 kg)   SpO2 98%   BMI 45.85 kg/m   Assessment and Plan: 1. Environmental and seasonal allergies Begin Claritin daily  2. Essential (primary) hypertension Controlled No reason for HH identified   No orders of the defined types were placed in this encounter.   Halina Maidens, MD [facility]  04/25/2017

## 2017-04-25 NOTE — Patient Instructions (Signed)
(  Loratidine) Claritin 10 mg once a day for several weeks - can continue it indefinitely

## 2017-05-17 ENCOUNTER — Other Ambulatory Visit: Payer: Self-pay

## 2017-05-17 DIAGNOSIS — E119 Type 2 diabetes mellitus without complications: Secondary | ICD-10-CM

## 2017-05-17 MED ORDER — LANTUS SOLOSTAR 100 UNIT/ML ~~LOC~~ SOPN: 34.0000 [IU] | PEN_INJECTOR | Freq: Every day | SUBCUTANEOUS | 3 refills | Status: DC

## 2017-05-24 ENCOUNTER — Other Ambulatory Visit: Payer: Self-pay

## 2017-05-24 MED ORDER — LANTUS SOLOSTAR 100 UNIT/ML ~~LOC~~ SOPN: 34.0000 [IU] | PEN_INJECTOR | Freq: Every day | SUBCUTANEOUS | 3 refills | Status: DC

## 2017-05-30 ENCOUNTER — Telehealth: Payer: Self-pay | Admitting: Internal Medicine

## 2017-05-30 ENCOUNTER — Encounter: Payer: Self-pay | Admitting: *Deleted

## 2017-05-30 ENCOUNTER — Ambulatory Visit
Admission: EM | Admit: 2017-05-30 | Discharge: 2017-05-30 | Disposition: A | Discharge status: Home / Self Care | Payer: Medicare Other | Attending: Family Medicine | Admitting: Family Medicine

## 2017-05-30 DIAGNOSIS — M25511 Pain in right shoulder: Secondary | ICD-10-CM

## 2017-05-30 DIAGNOSIS — M5010 Cervical disc disorder with radiculopathy, unspecified cervical region: Secondary | ICD-10-CM

## 2017-05-30 DIAGNOSIS — M5030 Other cervical disc degeneration, unspecified cervical region: Secondary | ICD-10-CM | POA: Diagnosis not present

## 2017-05-30 DIAGNOSIS — M542 Cervicalgia: Secondary | ICD-10-CM

## 2017-05-30 MED ORDER — PREDNISONE 20 MG PO TABS: ORAL_TABLET | ORAL | 0 refills | Status: DC

## 2017-05-30 NOTE — ED Provider Notes (Signed)
CSN: [id number]     Arrival date & time 05/30/17  0915 History   First MD Initiated Contact with Patient 05/30/17 1009     Chief Complaint  Patient presents with  . Shoulder Pain   (Consider location/radiation/quality/duration/timing/severity/associated sxs/prior Treatment) HPI  This is a 83 year old female who started having right shoulder and neck pain approximately 2 days ago. She does not remember any specific injury. She explains that it is radiating right-sided from her right paraspinous muscles intrascapular early on the right ring into her shoulder and upper arm. She also has hand and finger numbness and tingling but that is not new and has been present for years. She states that she even feels like she is having difficulty in her ambulation. She uses a cane since having both her knees replaced in 2000. She has a chronic kidney disease stage IV and hypertension along with type 2 diabetes mellitus. She was told that she should not be using any nonsteroidal anti-inflammatory medications. However she has been using diclofenac gel not realizing that this indeed was a nonsteroidal. Unfortunately she lives by herself and does not have anyone that can help her. Reviewed her previous CT scan of her cervical spine from 07/21/2016 which showed multilevel degenerative changes of the cervical spine without high-grade bony canal stenosis or foraminal narrowing.       Past Medical History:  Diagnosis Date  . Cancer ([place])   . Chronic kidney disease   . Diabetes mellitus without complication ([place])   . Gout   . Hyperlipidemia   . Hypertension   . Lymphedema   . Obesity   . Osteoporosis   . Paget's disease of bone   . Spinal stenosis    Past Surgical History:  Procedure Laterality Date  . ABDOMINAL HYSTERECTOMY    . JOINT REPLACEMENT  2000, 2006  . ORIF WRIST FRACTURE  07/2016   Family History  Problem Relation Age of Onset  . Family history unknown: Yes   Social History  Substance Use  Topics  . Smoking status: Former Smoker    Quit date: 01/02/1952  . Smokeless tobacco: Never Used  . Alcohol use No   OB History    No data available     Review of Systems  Constitutional: Positive for activity change. Negative for chills, fatigue and fever.  Musculoskeletal: Positive for neck pain and neck stiffness.  All other systems reviewed and are negative.   Allergies  Ciprofloxacin; Gluten meal; Pioglitazone; and Gabapentin  Home Medications   Prior to Admission medications   Medication Sig Start Date End Date Taking? Authorizing Provider  ACCU-CHEK AVIVA PLUS test strip USE AS DIRECTED THREE TIMES DAILY 11/16/16  Yes Glean Hess, MD  Ascorbic Acid (VITAMIN C) 1000 MG tablet Take 1,000 mg by mouth daily.   Yes [provider]  Cholecalciferol 5000 units capsule Take 1 capsule (5,000 Units total) by mouth daily. 05/16/16  Yes Plonk, Gwyndolyn Saxon, MD  diclofenac sodium (VOLTAREN) 1 % GEL  12/27/16  Yes [provider]  LANTUS SOLOSTAR 100 UNIT/ML Solostar Pen Inject 34 Units into the skin daily at 10 pm. 05/24/17  Yes Glean Hess, MD  vitamin B-12 (CYANOCOBALAMIN) 1000 MCG tablet Take 1 tablet (1,000 mcg total) by mouth daily. 05/16/16  Yes Plonk, Gwyndolyn Saxon, MD  Vitamin D, Ergocalciferol, (DRISDOL) 50000 units CAPS capsule Take 1 capsule (50,000 Units total) by mouth every 7 (seven) days. 12/04/16  Yes Glean Hess, MD  acetaminophen (TYLENOL) 325 MG tablet Take  650 mg by mouth every 6 (six) hours as needed.    [provider]  acetaminophen-codeine (TYLENOL #3) 300-30 MG tablet  12/26/16   [provider]  losartan (COZAAR) 25 MG tablet TAKE 1 TABLET BY MOUTH DAILY 04/03/17   Glean Hess, MD  predniSONE (DELTASONE) 20 MG tablet Take 1 tablet daily for 3 days neck pain 05/30/17   Lorin Picket, PA-C   Meds Ordered and Administered this Visit  Medications - No data to display  BP (!) 174/74 (BP Location: Right Wrist)   Pulse 73    Temp 97.6 F (36.4 C) (Oral)   Resp 16   Ht 4\' 11"  (1.499 m)   Wt 226 lb (102.5 kg)   SpO2 99%   BMI 45.65 kg/m  No data found.   Physical Exam  Constitutional: She is oriented to person, place, and time. She appears well-developed and well-nourished. No distress.  HENT:  Head: Normocephalic.  Eyes: Pupils are equal, round, and reactive to light. Right eye exhibits no discharge. Left eye exhibits no discharge.  Neck: Normal range of motion.  Examination of the cervical spine shows significant decreased range of motion particularly to extension and right lateral rotation and right lateral flexion and extension which is the most painful. She has a tightness and tenderness of the paraspinous muscles on the right into the trapezial area and intrascapular along the medial scapular border of the right scapula. Upper extremity sensation is intact to light touch. Strength is intact.  Musculoskeletal: Normal range of motion.  Neurological: She is alert and oriented to person, place, and time.  Skin: Skin is warm and dry. She is not diaphoretic.  Psychiatric: She has a normal mood and affect. Her behavior is normal. Judgment and thought content normal.  Nursing note and vitals reviewed.   Urgent Care Course     Procedures (including critical care time)  Labs Review Labs Reviewed - No data to display  Imaging Review No results found.   Visual Acuity Review  Right Eye Distance:   Left Eye Distance:   Bilateral Distance:    Right Eye Near:   Left Eye Near:    Bilateral Near:         MDM   1. DDD (degenerative disc disease), cervical   2. Cervical disc disorder with radiculopathy of cervical region    Discharge Medication List as of 05/30/2017 10:53 AM    START taking these medications   Details  predniSONE (DELTASONE) 20 MG tablet Take 1 tablet daily for 3 days neck pain, Normal      Plan: 1. Test/x-ray results and diagnosis reviewed with patient 2. rx as per  orders; risks, benefits, potential side effects reviewed with patient 3. Recommend supportive treatment with Rest and symptom avoidance. Recommend use of ice for the next 2 days and then begin alternating ice and heat 20 minutes every 2 hours 4 times daily. Recommended against using the diclofenac gel because of her chronic kidney disease. Instead I will prescribe prednisone on a straight dose instead of tapering since the tapering has bothered her in the past. I will give her 3 days 20 mg to help take the "edge" off of her pain. I've also encouraged use of Bio Freeze gel 3 times daily to help with the pain control as well. If she is not improving next week she should follow-up with her primary care next week 4. F/u prn if symptoms worsen or don't improve  Lorin Picket, PA-C 05/30/17 1106

## 2017-05-30 NOTE — Telephone Encounter (Signed)
Advised 

## 2017-05-30 NOTE — Discharge Instructions (Signed)
Use Biofreeze to your neck 3 times daily as necessary for pain. Use ice for the next 2 days and then start alternating ice and heat as necessary for neck comfort. Follow up With your primary care physician next week if you are not improving

## 2017-05-30 NOTE — Telephone Encounter (Signed)
Patient called with complaint of right arm shoulder pain, and back. No shortness of breath, no chest pain. I was able to get her to see urgent care today and to follow up with [place] later.

## 2017-05-30 NOTE — ED Triage Notes (Signed)
Patient started having right shoulder and neck pain 2 days ago. Mechanism of injury unknown.

## 2017-06-10 ENCOUNTER — Other Ambulatory Visit: Payer: Self-pay

## 2017-06-10 ENCOUNTER — Encounter: Payer: Self-pay | Admitting: Internal Medicine

## 2017-07-04 ENCOUNTER — Ambulatory Visit
Admission: EM | Admit: 2017-07-04 | Discharge: 2017-07-04 | Disposition: A | Discharge status: Home / Self Care | Payer: Medicare Other | Attending: Physician Assistant | Admitting: Physician Assistant

## 2017-07-04 DIAGNOSIS — R2242 Localized swelling, mass and lump, left lower limb: Secondary | ICD-10-CM

## 2017-07-04 DIAGNOSIS — R600 Localized edema: Secondary | ICD-10-CM | POA: Diagnosis not present

## 2017-07-04 DIAGNOSIS — R21 Rash and other nonspecific skin eruption: Secondary | ICD-10-CM

## 2017-07-04 DIAGNOSIS — R2241 Localized swelling, mass and lump, right lower limb: Secondary | ICD-10-CM | POA: Diagnosis not present

## 2017-07-04 MED ORDER — CEPHALEXIN 250 MG PO CAPS: 250.0000 mg | ORAL_CAPSULE | Freq: Three times a day (TID) | ORAL | 0 refills | Status: AC

## 2017-07-04 NOTE — Discharge Instructions (Signed)
-  Keflex: three times a day for 7 days -keep area clean -follow up with PCP in 7-10 days -return to clinic or PCP sooner if area becomes redder, becomes painful, or the drainage changes from the clear fluid.

## 2017-07-04 NOTE — ED Triage Notes (Signed)
83 year old Caucasian female is here today with complaints of cellulitis or abscess on her left lower leg that she noticed last night.

## 2017-07-04 NOTE — ED Provider Notes (Signed)
[facility]    CSN: [id number] Arrival date & time: 07/04/17  1351     History   Chief Complaint Chief Complaint  Patient presents with  . Cellulitis    HPI Heather Long is a 83 y.o. female.   Patient is a 83 year old female with past history of chronic kidney disease, diabetes with peripheral neuropathy, lymphedema, and hypertension who presents with complaint of a boil or abscess to her left leg. Patient states she noticed the bump on her leg last night and thought the area felt a little tight and put her normal lotion/cream on last night. Patient thought she saw some white as part of the bump and was concerned for infection given her history of diabetes.  Patient does have a history of injury to the same area 8 years ago that has been slow to heal but has not had this appearance before. Patient seems to answer questions appropriately but there does seems to be some confusion when discussing her history, especially in regards to this leg and reported previous injury.      Past Medical History:  Diagnosis Date  . Cancer ([place])   . Chronic kidney disease   . Diabetes mellitus without complication ([place])   . Gout   . Hyperlipidemia   . Hypertension   . Lymphedema   . Obesity   . Osteoporosis   . Paget's disease of bone   . Spinal stenosis     Patient Active Problem List   Diagnosis Date Noted  . Acute inflammation of sinus 03/13/2017  . Degeneration macular 05/23/2016  . Osteitis deformans 05/23/2016  . Neoplasm of uncertain behavior of skin of face 05/23/2016  . Psoriasis 05/23/2016  . Vitamin B12 deficiency 05/16/2016  . Obesity, Class III, BMI 40-49.9 (morbid obesity) ([place]) 05/02/2016  . Type 2 DM with CKD stage 3 and hypertension ([place]) 05/02/2016  . Vitamin D deficiency 05/02/2016  . Lower extremity edema 05/02/2016  . Spinal stenosis of lumbar region 05/02/2016  . Essential (primary) hypertension 06/02/2012  . Hyperlipidemia associated with  type 2 diabetes mellitus ([place]) 06/02/2012  . Malignant neoplasm of breast ([place]) 06/14/2011  . Generalized OA 06/14/2011  . Lymphedema of arm 06/14/2011  . OP (osteoporosis) 06/14/2011    Past Surgical History:  Procedure Laterality Date  . ABDOMINAL HYSTERECTOMY    . JOINT REPLACEMENT  2000, 2006  . ORIF WRIST FRACTURE  07/2016    OB History    No data available       Home Medications    Prior to Admission medications   Medication Sig Start Date End Date Taking? Authorizing Provider  ACCU-CHEK AVIVA PLUS test strip USE AS DIRECTED THREE TIMES DAILY 11/16/16  Yes Glean Hess, MD  acetaminophen (TYLENOL) 325 MG tablet Take 650 mg by mouth every 6 (six) hours as needed.   Yes [provider]  acetaminophen-codeine (TYLENOL #3) 300-30 MG tablet  12/26/16  Yes [provider]  Ascorbic Acid (VITAMIN C) 1000 MG tablet Take 1,000 mg by mouth daily.   Yes [provider]  Cholecalciferol 5000 units capsule Take 1 capsule (5,000 Units total) by mouth daily. 05/16/16  Yes Plonk, Gwyndolyn Saxon, MD  diclofenac sodium (VOLTAREN) 1 % GEL  12/27/16  Yes [provider]  LANTUS SOLOSTAR 100 UNIT/ML Solostar Pen Inject 34 Units into the skin daily at 10 pm. 05/24/17  Yes Glean Hess, MD  losartan (COZAAR) 25 MG tablet TAKE 1 TABLET BY MOUTH DAILY 04/03/17  Yes  Glean Hess, MD  predniSONE (DELTASONE) 20 MG tablet Take 1 tablet daily for 3 days neck pain 05/30/17  Yes Lorin Picket, PA-C  vitamin B-12 (CYANOCOBALAMIN) 1000 MCG tablet Take 1 tablet (1,000 mcg total) by mouth daily. 05/16/16  Yes Plonk, Gwyndolyn Saxon, MD  Vitamin D, Ergocalciferol, (DRISDOL) 50000 units CAPS capsule Take 1 capsule (50,000 Units total) by mouth every 7 (seven) days. 12/04/16  Yes Glean Hess, MD  cephALEXin (KEFLEX) 250 MG capsule Take 1 capsule (250 mg total) by mouth 3 (three) times daily. 07/04/17 07/11/17  Luvenia Redden, PA-C    Family History Family History  Problem  Relation Age of Onset  . Family history unknown: Yes    Social History Social History  Substance Use Topics  . Smoking status: Former Smoker    Quit date: 01/02/1952  . Smokeless tobacco: Never Used  . Alcohol use No     Allergies   Ciprofloxacin; Gluten meal; Pioglitazone; and Gabapentin   Review of Systems Review of Systems as noted above in history of present illness. Patient does have chronic edema as well as peripheral neuropathy. Other systems reviewed and found to be negative except as otherwise stated.   Physical Exam Triage Vital Signs ED Triage Vitals  Enc Vitals Group     BP 07/04/17 1417 (!) 150/80     Pulse Rate 07/04/17 1417 84     Resp 07/04/17 1417 16     Temp 07/04/17 1417 98.2 F (36.8 C)     Temp Source 07/04/17 1417 Oral     SpO2 07/04/17 1417 98 %     Weight 07/04/17 1424 225 lb (102.1 kg)     Height 07/04/17 1424 4\' 11"  (1.499 m)     Head Circumference --      Peak Flow --      Pain Score --      Pain Loc --      Pain Edu? --      Excl. in [place]? --    No data found.   Updated Vital Signs BP (!) 150/80 (BP Location: Right Arm)   Pulse 84   Temp 98.2 F (36.8 C) (Oral)   Resp 16   Ht 4\' 11"  (1.499 m)   Wt 225 lb (102.1 kg)   SpO2 98%   BMI 45.44 kg/m   Visual Acuity Right Eye Distance:   Left Eye Distance:   Bilateral Distance:    Right Eye Near:   Left Eye Near:    Bilateral Near:     Physical Exam  Constitutional: She is oriented to person, place, and time. She appears well-developed and well-nourished. No distress.  HENT:  Head: Normocephalic and atraumatic.  Eyes: Pupils are equal, round, and reactive to light. EOM are normal.  Neck: Normal range of motion. Neck supple.  Cardiovascular: Normal rate and regular rhythm.   Pulmonary/Chest: Effort normal and breath sounds normal. No respiratory distress. She has no wheezes.  Abdominal: Soft.  Musculoskeletal: Normal range of motion. She exhibits edema (2-3+ edema to bilateral  legs, chronic).  Neurological: She is alert and oriented to person, place, and time. No cranial nerve deficit.  Skin: Skin is warm and dry. No rash noted.        UC Treatments / Results  Labs (all labs ordered are listed, but only abnormal results are displayed) Labs Reviewed  AEROBIC CULTURE (SUPERFICIAL SPECIMEN)    EKG  EKG Interpretation None       Radiology No  results found.  Procedures Procedures (including critical care time)  Medications Ordered in UC Medications - No data to display   Initial Impression / Assessment and Plan / UC Course  I have reviewed the triage vital signs and the nursing notes.  Pertinent labs & imaging results that were available during my care of the patient were reviewed by me and considered in my medical decision making (see chart for details).     Skin rash of uncertain etiology. Differential includes cellulitis, fasciitis, bullous diabeticorum, or seepage from chronic lymphedema. Pt seen with NP Sabra Heck.  Final Clinical Impressions(s) / UC Diagnoses   Final diagnoses:  Bullous rash    New Prescriptions New Prescriptions   CEPHALEXIN (KEFLEX) 250 MG CAPSULE    Take 1 capsule (250 mg total) by mouth 3 (three) times daily.     Controlled Substance Prescriptions [place] Controlled Substance Registry consulted? Not Applicable   Patient presents with a bullous rash to her left leg. Patient with chronic edema and diabetes. Patient also with history of a wound in that same location that sensitive been slow healing. Differentials include cellulitis, fasciitis, seepage from her chronic lymphedema, and bullous diabeticorum. Less likely with the cellulitis and fasciitis as there is minimal redness, tenderness, and patient does not seem septic. More likely it is related to either her chronic lymphedema or just a benign bullous diabeticorum. We'll send a culture of the fluid to rule out any infection. Don't want to give her any oral steroids  due to her renal failure and her diabetes. Will recommend patient to keep it clean and will give her a short course of antibiotics prophylactically. Patient advised to follow-up with her primary care physician in the next week or so, but more urgently should her wound worsen and appear more infectious. Patient also advised she could follow with this clinic as needed. Patient verbalized understanding and agreement with plan.  Luvenia Redden, PA-C     Luvenia Redden, PA-C 07/04/17 [phone]

## 2017-07-07 LAB — AEROBIC CULTURE  (SUPERFICIAL SPECIMEN): SPECIAL REQUESTS: NORMAL

## 2017-07-07 LAB — AEROBIC CULTURE W GRAM STAIN (SUPERFICIAL SPECIMEN): Culture: NORMAL

## 2017-07-09 ENCOUNTER — Telehealth: Payer: Self-pay

## 2017-07-09 NOTE — Telephone Encounter (Signed)
Spoke with pt. Pt. Advised of all results and verbalized understanding. Patient will call back with any questions or concerns. [facility]

## 2017-07-16 ENCOUNTER — Ambulatory Visit (INDEPENDENT_AMBULATORY_CARE_PROVIDER_SITE_OTHER): Payer: Medicare Other | Admitting: Internal Medicine

## 2017-07-16 ENCOUNTER — Encounter: Payer: Self-pay | Admitting: Internal Medicine

## 2017-07-16 VITALS — BP 126/62 | HR 60 | Ht 59.0 in | Wt 228.6 lb

## 2017-07-16 DIAGNOSIS — E1122 Type 2 diabetes mellitus with diabetic chronic kidney disease: Secondary | ICD-10-CM

## 2017-07-16 DIAGNOSIS — I129 Hypertensive chronic kidney disease with stage 1 through stage 4 chronic kidney disease, or unspecified chronic kidney disease: Secondary | ICD-10-CM | POA: Diagnosis not present

## 2017-07-16 DIAGNOSIS — M159 Polyosteoarthritis, unspecified: Secondary | ICD-10-CM

## 2017-07-16 DIAGNOSIS — H6123 Impacted cerumen, bilateral: Secondary | ICD-10-CM | POA: Diagnosis not present

## 2017-07-16 DIAGNOSIS — N183 Chronic kidney disease, stage 3 (moderate): Secondary | ICD-10-CM | POA: Diagnosis not present

## 2017-07-16 DIAGNOSIS — J329 Chronic sinusitis, unspecified: Secondary | ICD-10-CM

## 2017-07-16 DIAGNOSIS — I10 Essential (primary) hypertension: Secondary | ICD-10-CM | POA: Diagnosis not present

## 2017-07-16 DIAGNOSIS — I872 Venous insufficiency (chronic) (peripheral): Secondary | ICD-10-CM | POA: Diagnosis not present

## 2017-07-16 MED ORDER — SULFAMETHOXAZOLE-TRIMETHOPRIM 800-160 MG PO TABS: 1.0000 | ORAL_TABLET | Freq: Two times a day (BID) | ORAL | 0 refills | Status: DC

## 2017-07-16 NOTE — Patient Instructions (Addendum)
Get Allegra (Fexofenodine) 180 mg - take once a day during bad allergy season.  Elevate legs over nose for one hour twice a day.

## 2017-07-16 NOTE — Progress Notes (Signed)
Date:  07/16/2017   Name:  Heather Long   DOB:  13-Feb-1928   MRN:  [id number]   Chief Complaint: Hypertension; Diabetes (Does nto check sugar at home often. ); Wound Check (She has a spot on front of Left Leg which she was told by UC to get leg checked out by PCP- She said it does not hurt but sometimes blisters. ); and Allergic Rhinitis  (States she has had this since allergy season started months ago. Wants to be sure not something other than allergies. ) Hypertension  This is a chronic problem. The problem is controlled. Pertinent negatives include no chest pain, headaches, palpitations or shortness of breath. Risk factors for coronary artery disease include diabetes mellitus. Past treatments include angiotensin blockers. The current treatment provides significant improvement.  Diabetes  She presents for her follow-up diabetic visit. She has type 2 diabetes mellitus. Her disease course has been stable. Pertinent negatives for hypoglycemia include no dizziness or headaches. Associated symptoms include fatigue. Pertinent negatives for diabetes include no chest pain. Current diabetic treatment includes insulin injections. She is compliant with treatment all of the time. She monitors blood glucose at home 1-2 x per day. There is no change in her home blood glucose trend. Her breakfast blood glucose is taken between 6-7 am. Her breakfast blood glucose range is generally 110-130 mg/dl.  Sinus Problem  This is a chronic problem. The problem is unchanged. Pertinent negatives include no chills, headaches or shortness of breath.  Wound Check  She was originally treated 10 to 14 days ago. Previous treatment included oral antibiotics. Her temperature was unmeasured prior to arrival. There has been clear discharge from the wound. The redness has improved. The swelling has not changed. There is no pain present.  OA - in knees, worse on right.  Having more constant pain not relieved by Tylenol.  She uses a  cane to walk. She is requesting pain medications.  She also needs a handicapped parking placard. She is requesting medication for pain.  Ortho has given her a knee brace for stability but she can not get it on herself.   Review of Systems  Constitutional: Positive for fatigue. Negative for chills and fever.  Respiratory: Negative for chest tightness, shortness of breath and wheezing.   Cardiovascular: Positive for leg swelling. Negative for chest pain and palpitations.  Gastrointestinal: Negative for abdominal pain.  Musculoskeletal: Positive for arthralgias, gait problem and myalgias.  Skin: Positive for color change, rash and wound.  Neurological: Negative for dizziness and headaches.  Psychiatric/Behavioral: Positive for dysphoric mood.    Patient Active Problem List   Diagnosis Date Noted  . Venous stasis dermatitis of left lower extremity 07/16/2017  . Chronic congestion of paranasal sinus 03/13/2017  . Degeneration macular 05/23/2016  . Osteitis deformans 05/23/2016  . Neoplasm of uncertain behavior of skin of face 05/23/2016  . Psoriasis 05/23/2016  . Vitamin B12 deficiency 05/16/2016  . Obesity, Class III, BMI 40-49.9 (morbid obesity) ([place]) 05/02/2016  . Type 2 DM with CKD stage 3 and hypertension ([place]) 05/02/2016  . Vitamin D deficiency 05/02/2016  . Lower extremity edema 05/02/2016  . Spinal stenosis of lumbar region 05/02/2016  . Essential (primary) hypertension 06/02/2012  . Hyperlipidemia associated with type 2 diabetes mellitus ([place]) 06/02/2012  . Malignant neoplasm of breast ([place]) 06/14/2011  . Generalized OA 06/14/2011  . Lymphedema of arm 06/14/2011  . OP (osteoporosis) 06/14/2011    Prior to Admission medications   Medication Sig Start Date  End Date Taking? Authorizing Provider  ACCU-CHEK AVIVA PLUS test strip USE AS DIRECTED THREE TIMES DAILY 11/16/16  Yes Glean Hess, MD  acetaminophen (TYLENOL) 325 MG tablet Take 650 mg by mouth every 6 (six) hours  as needed.   Yes [provider]  Ascorbic Acid (VITAMIN C) 1000 MG tablet Take 1,000 mg by mouth daily.   Yes [provider]  Cholecalciferol 5000 units capsule Take 1 capsule (5,000 Units total) by mouth daily. 05/16/16  Yes Plonk, Gwyndolyn Saxon, MD  diclofenac sodium (VOLTAREN) 1 % GEL  12/27/16  Yes [provider]  LANTUS SOLOSTAR 100 UNIT/ML Solostar Pen Inject 34 Units into the skin daily at 10 pm. 05/24/17  Yes Glean Hess, MD  losartan (COZAAR) 25 MG tablet TAKE 1 TABLET BY MOUTH DAILY 04/03/17  Yes Glean Hess, MD  vitamin B-12 (CYANOCOBALAMIN) 1000 MCG tablet Take 1 tablet (1,000 mcg total) by mouth daily. 05/16/16  Yes Plonk, Gwyndolyn Saxon, MD    Allergies  Allergen Reactions  . Ciprofloxacin Nausea And Vomiting  . Gluten Meal Other (See Comments)    GI distress  . Pioglitazone Other (See Comments)    Leg swelling  . Gabapentin Diarrhea, Nausea Only and Other (See Comments)    dizziness    Past Surgical History:  Procedure Laterality Date  . ABDOMINAL HYSTERECTOMY    . JOINT REPLACEMENT  2000, 2006  . ORIF WRIST FRACTURE  07/2016    Social History  Substance Use Topics  . Smoking status: Former Smoker    Quit date: 01/02/1952  . Smokeless tobacco: Never Used  . Alcohol use No     Medication list has been reviewed and updated.  PHQ 2/9 Scores 01/01/2017 05/16/2016 05/02/2016  PHQ - 2 Score 4 0 0  PHQ- 9 Score 8 - -    Physical Exam  Constitutional: She is oriented to person, place, and time. She appears well-developed. No distress.  HENT:  Head: Normocephalic and atraumatic.  Right Ear: Decreased hearing is noted.  Left Ear: Decreased hearing is noted.  Nose: Right sinus exhibits no maxillary sinus tenderness. Left sinus exhibits no maxillary sinus tenderness.  Mouth/Throat: Uvula is midline. No posterior oropharyngeal edema or posterior oropharyngeal erythema.  Cerumen impaction bilaterally  Neck: Normal range of motion. Neck supple.    Cardiovascular: Normal rate, regular rhythm and normal heart sounds.   Pulmonary/Chest: Effort normal. No respiratory distress.  Musculoskeletal: She exhibits edema and tenderness.       Right knee: She exhibits decreased range of motion. She exhibits no swelling and no effusion.       Left knee: She exhibits decreased range of motion. She exhibits no swelling and no effusion.  Neurological: She is alert and oriented to person, place, and time.  Skin: Skin is warm and dry. No rash noted.     Psychiatric: She has a normal mood and affect. Her behavior is normal. Thought content normal.  Nursing note and vitals reviewed.   BP 126/62   Pulse 60   Ht 4\' 11"  (1.499 m)   Wt 228 lb 9.6 oz (103.7 kg)   SpO2 98%   BMI 46.17 kg/m   Assessment and Plan: 1. Essential (primary) hypertension controlled - Comprehensive metabolic panel  2. Type 2 DM with CKD stage 3 and hypertension (HCC) Continue insulin - Hemoglobin A1c  3. Generalized OA Follow up with Ortho Handicapped placard application signed  4. Venous stasis dermatitis of left lower extremity Additional antibiotics (fluid culture negative)  Pt instructed to elevate toes above the nose for one hour twice a day - sulfamethoxazole-trimethoprim (BACTRIM DS,SEPTRA DS) 800-160 MG tablet; Take 1 tablet by mouth 2 (two) times daily.  Dispense: 20 tablet; Refill: 0  5. Chronic congestion of paranasal sinus Recommend Allegra 180 mg daily  6. Bilateral impacted cerumen - Ambulatory referral to ENT   Meds ordered this encounter  Medications  . sulfamethoxazole-trimethoprim (BACTRIM DS,SEPTRA DS) 800-160 MG tablet    Sig: Take 1 tablet by mouth 2 (two) times daily.    Dispense:  20 tablet    Refill:  0    Partially dictated using Editor, commissioning. Any errors are unintentional.  Halina Maidens, MD [facility]  07/16/2017

## 2017-07-17 ENCOUNTER — Other Ambulatory Visit: Payer: Self-pay | Admitting: Internal Medicine

## 2017-07-17 DIAGNOSIS — E1122 Type 2 diabetes mellitus with diabetic chronic kidney disease: Secondary | ICD-10-CM

## 2017-07-17 DIAGNOSIS — I129 Hypertensive chronic kidney disease with stage 1 through stage 4 chronic kidney disease, or unspecified chronic kidney disease: Principal | ICD-10-CM

## 2017-07-17 DIAGNOSIS — N184 Chronic kidney disease, stage 4 (severe): Principal | ICD-10-CM

## 2017-07-17 LAB — COMPREHENSIVE METABOLIC PANEL
A/G RATIO: 2 (ref 1.2–2.2)
ALBUMIN: 4.1 g/dL (ref 3.5–4.7)
ALK PHOS: 61 IU/L (ref 39–117)
ALT: 10 IU/L (ref 0–32)
AST: 17 IU/L (ref 0–40)
BUN / CREAT RATIO: 14 (ref 12–28)
BUN: 21 mg/dL (ref 8–27)
Bilirubin Total: 0.4 mg/dL (ref 0.0–1.2)
CALCIUM: 9.3 mg/dL (ref 8.7–10.3)
CO2: 19 mmol/L — AB (ref 20–29)
CREATININE: 1.46 mg/dL — AB (ref 0.57–1.00)
Chloride: 106 mmol/L (ref 96–106)
GFR calc Af Amer: 37 mL/min/{1.73_m2} — ABNORMAL LOW (ref >59)
GFR, EST NON AFRICAN AMERICAN: 32 mL/min/{1.73_m2} — AB (ref >59)
GLOBULIN, TOTAL: 2.1 g/dL (ref 1.5–4.5)
Glucose: 137 mg/dL — ABNORMAL HIGH (ref 65–99)
POTASSIUM: 4.6 mmol/L (ref 3.5–5.2)
SODIUM: 141 mmol/L (ref 134–144)
Total Protein: 6.2 g/dL (ref 6.0–8.5)

## 2017-07-17 LAB — HEMOGLOBIN A1C
Est. average glucose Bld gHb Est-mCnc: 157 mg/dL
Hgb A1c MFr Bld: 7.1 % — ABNORMAL HIGH (ref 4.8–5.6)

## 2017-07-17 NOTE — Progress Notes (Signed)
Patient informed about labs and kidney function. Mailing a copy out to her for her records. She stated she agree's to see Nephrologist.

## 2017-07-23 DIAGNOSIS — E1122 Type 2 diabetes mellitus with diabetic chronic kidney disease: Secondary | ICD-10-CM | POA: Diagnosis not present

## 2017-07-23 DIAGNOSIS — R601 Generalized edema: Secondary | ICD-10-CM | POA: Diagnosis not present

## 2017-07-23 DIAGNOSIS — N183 Chronic kidney disease, stage 3 (moderate): Secondary | ICD-10-CM | POA: Diagnosis not present

## 2017-07-23 DIAGNOSIS — R600 Localized edema: Secondary | ICD-10-CM | POA: Diagnosis not present

## 2017-07-23 DIAGNOSIS — I10 Essential (primary) hypertension: Secondary | ICD-10-CM | POA: Diagnosis not present

## 2017-07-30 DIAGNOSIS — H6123 Impacted cerumen, bilateral: Secondary | ICD-10-CM | POA: Diagnosis not present

## 2017-07-30 DIAGNOSIS — H903 Sensorineural hearing loss, bilateral: Secondary | ICD-10-CM | POA: Diagnosis not present

## 2017-07-31 ENCOUNTER — Ambulatory Visit: Payer: Medicare Other | Admitting: Internal Medicine

## 2017-08-20 DIAGNOSIS — N183 Chronic kidney disease, stage 3 (moderate): Secondary | ICD-10-CM | POA: Diagnosis not present

## 2017-08-20 DIAGNOSIS — R809 Proteinuria, unspecified: Secondary | ICD-10-CM | POA: Diagnosis not present

## 2017-08-21 ENCOUNTER — Other Ambulatory Visit: Payer: Self-pay | Admitting: Internal Medicine

## 2017-08-21 DIAGNOSIS — N2581 Secondary hyperparathyroidism of renal origin: Secondary | ICD-10-CM | POA: Diagnosis not present

## 2017-08-21 DIAGNOSIS — N183 Chronic kidney disease, stage 3 (moderate): Secondary | ICD-10-CM | POA: Diagnosis not present

## 2017-08-21 DIAGNOSIS — I10 Essential (primary) hypertension: Secondary | ICD-10-CM | POA: Diagnosis not present

## 2017-08-21 DIAGNOSIS — R809 Proteinuria, unspecified: Secondary | ICD-10-CM | POA: Diagnosis not present

## 2017-08-21 DIAGNOSIS — E1122 Type 2 diabetes mellitus with diabetic chronic kidney disease: Secondary | ICD-10-CM | POA: Diagnosis not present

## 2017-08-22 DIAGNOSIS — N2581 Secondary hyperparathyroidism of renal origin: Secondary | ICD-10-CM | POA: Diagnosis not present

## 2017-08-22 DIAGNOSIS — I10 Essential (primary) hypertension: Secondary | ICD-10-CM | POA: Diagnosis not present

## 2017-08-22 DIAGNOSIS — N183 Chronic kidney disease, stage 3 (moderate): Secondary | ICD-10-CM | POA: Diagnosis not present

## 2017-08-30 DIAGNOSIS — H04123 Dry eye syndrome of bilateral lacrimal glands: Secondary | ICD-10-CM | POA: Diagnosis not present

## 2017-09-30 ENCOUNTER — Other Ambulatory Visit: Payer: Self-pay | Admitting: Internal Medicine

## 2017-09-30 DIAGNOSIS — I10 Essential (primary) hypertension: Secondary | ICD-10-CM

## 2017-09-30 NOTE — Telephone Encounter (Signed)
LVM to set up appt

## 2017-10-09 ENCOUNTER — Ambulatory Visit: Payer: Medicare Other | Admitting: Internal Medicine

## 2017-10-16 ENCOUNTER — Other Ambulatory Visit: Payer: Self-pay | Admitting: Internal Medicine

## 2017-10-16 ENCOUNTER — Encounter: Payer: Self-pay | Admitting: Internal Medicine

## 2017-10-16 ENCOUNTER — Ambulatory Visit (INDEPENDENT_AMBULATORY_CARE_PROVIDER_SITE_OTHER): Payer: Medicare Other | Admitting: Internal Medicine

## 2017-10-16 VITALS — BP 150/95 | HR 80 | Ht 59.0 in | Wt 227.0 lb

## 2017-10-16 DIAGNOSIS — E559 Vitamin D deficiency, unspecified: Secondary | ICD-10-CM

## 2017-10-16 DIAGNOSIS — N184 Chronic kidney disease, stage 4 (severe): Secondary | ICD-10-CM | POA: Diagnosis not present

## 2017-10-16 DIAGNOSIS — I872 Venous insufficiency (chronic) (peripheral): Secondary | ICD-10-CM

## 2017-10-16 DIAGNOSIS — Z9181 History of falling: Secondary | ICD-10-CM | POA: Diagnosis not present

## 2017-10-16 DIAGNOSIS — E785 Hyperlipidemia, unspecified: Secondary | ICD-10-CM

## 2017-10-16 DIAGNOSIS — E1169 Type 2 diabetes mellitus with other specified complication: Secondary | ICD-10-CM | POA: Diagnosis not present

## 2017-10-16 DIAGNOSIS — I10 Essential (primary) hypertension: Secondary | ICD-10-CM | POA: Diagnosis not present

## 2017-10-16 DIAGNOSIS — E1122 Type 2 diabetes mellitus with diabetic chronic kidney disease: Secondary | ICD-10-CM | POA: Diagnosis not present

## 2017-10-16 DIAGNOSIS — I129 Hypertensive chronic kidney disease with stage 1 through stage 4 chronic kidney disease, or unspecified chronic kidney disease: Secondary | ICD-10-CM | POA: Diagnosis not present

## 2017-10-16 DIAGNOSIS — N2581 Secondary hyperparathyroidism of renal origin: Secondary | ICD-10-CM

## 2017-10-16 NOTE — Patient Instructions (Signed)
Start back on Losartan 25 mg once a day.

## 2017-10-16 NOTE — Progress Notes (Signed)
Date:  10/16/2017   Name:  Heather Long   DOB:  [birth year]   MRN:  [id number]   Chief Complaint: Follow-up (med refill) Hypertension  This is a chronic problem. The problem has been gradually worsening since onset. The problem is uncontrolled. Pertinent negatives include no chest pain, headaches, palpitations or shortness of breath. Past treatments include angiotensin blockers and calcium channel blockers (stopped losartan and started amlodipine but stopped it due to joint pains).  Diabetes  She presents for her follow-up diabetic visit. She has type 2 diabetes mellitus. Hypoglycemia symptoms include nervousness/anxiousness. Pertinent negatives for hypoglycemia include no confusion, dizziness or headaches. Associated symptoms include weakness (in legs - uses a cane). Pertinent negatives for diabetes include no chest pain and no fatigue. Symptoms are stable. She is compliant with treatment most of the time.  Renal Insufficiency - most recent lab showed GFR 43 after visit with Nephrology.  She also has low Vitamin D so started on high dose Vit D. She was also told to hold cozaar and begin amlodipine which she could not take.  She has not resume cozaar.  She did have an [place] which was reported as normal. Venous stasis/cellulitis - this resolved with a course of Bactrim.   In the future, she should avoid this as it could worsen CKD.   Review of Systems  Constitutional: Negative for chills, fatigue and fever.  HENT: Negative for congestion, sinus pressure and trouble swallowing.   Eyes: Negative for visual disturbance.  Respiratory: Negative for cough, chest tightness and shortness of breath.   Cardiovascular: Negative for chest pain and palpitations.  Gastrointestinal: Negative for abdominal pain, blood in stool and constipation.  Musculoskeletal: Positive for arthralgias, back pain and gait problem (and at high risk for falls).  Skin: Negative for color change and rash.  Neurological:  Positive for weakness (in legs - uses a cane) and numbness. Negative for dizziness and headaches.  Psychiatric/Behavioral: Negative for confusion, decreased concentration and dysphoric mood. The patient is nervous/anxious.     Patient Active Problem List   Diagnosis Date Noted  . Secondary hyperparathyroidism of renal origin ([place]) 10/16/2017  . Venous stasis dermatitis of left lower extremity 07/16/2017  . Bilateral impacted cerumen 07/16/2017  . Chronic congestion of paranasal sinus 03/13/2017  . Degeneration macular 05/23/2016  . Osteitis deformans 05/23/2016  . Neoplasm of uncertain behavior of skin of face 05/23/2016  . Psoriasis 05/23/2016  . Vitamin B12 deficiency 05/16/2016  . Obesity, Class III, BMI 40-49.9 (morbid obesity) ([place]) 05/02/2016  . Type 2 DM with CKD stage 4 and hypertension ([place]) 05/02/2016  . Vitamin D deficiency 05/02/2016  . Lower extremity edema 05/02/2016  . Spinal stenosis of lumbar region 05/02/2016  . Essential (primary) hypertension 06/02/2012  . Hyperlipidemia associated with type 2 diabetes mellitus ([place]) 06/02/2012  . Malignant neoplasm of breast ([place]) 06/14/2011  . Generalized OA 06/14/2011  . Lymphedema of arm 06/14/2011  . OP (osteoporosis) 06/14/2011    Prior to Admission medications   Medication Sig Start Date End Date Taking? Authorizing Provider  ACCU-CHEK AVIVA PLUS test strip USE AS DIRECTED THREE TIMES DAILY 11/16/16  Yes Glean Hess, MD  acetaminophen (TYLENOL) 325 MG tablet Take 650 mg by mouth every 6 (six) hours as needed.   Yes [provider]  amLODipine (NORVASC) 2.5 MG tablet Take 1 tablet by mouth daily. 08/21/17  Yes [provider]  Ascorbic Acid (VITAMIN C) 1000 MG tablet Take 1,000 mg by mouth daily.  Yes [provider]  Cholecalciferol 5000 units capsule Take 1 capsule (5,000 Units total) by mouth daily. 05/16/16  Yes Plonk, Gwyndolyn Saxon, MD  diclofenac sodium (VOLTAREN) 1 % GEL  12/27/16  Yes  [provider]  LANTUS SOLOSTAR 100 UNIT/ML Solostar Pen Inject 34 Units into the skin daily at 10 pm. 05/24/17  Yes Glean Hess, MD  vitamin B-12 (CYANOCOBALAMIN) 1000 MCG tablet Take 1 tablet (1,000 mcg total) by mouth daily. 05/16/16  Yes Plonk, Gwyndolyn Saxon, MD  Vitamin D, Ergocalciferol, (DRISDOL) 50000 units CAPS capsule TK 1 C PO 1 TIME A WK 08/21/17  Yes [provider]  losartan (COZAAR) 25 MG tablet TAKE 1 TABLET BY MOUTH DAILY Patient not taking: Reported on 10/16/2017 09/30/17   Glean Hess, MD    Allergies  Allergen Reactions  . Ciprofloxacin Nausea And Vomiting  . Gluten Meal Other (See Comments)    GI distress  . Pioglitazone Other (See Comments)    Leg swelling  . Gabapentin Diarrhea, Nausea Only and Other (See Comments)    dizziness    Past Surgical History:  Procedure Laterality Date  . ABDOMINAL HYSTERECTOMY    . JOINT REPLACEMENT  2000, 2006  . ORIF WRIST FRACTURE  07/2016    Social History   Tobacco Use  . Smoking status: Former Smoker    Last attempt to quit: 01/02/1952    Years since quitting: 65.8  . Smokeless tobacco: Never Used  Substance Use Topics  . Alcohol use: No  . Drug use: No     Medication list has been reviewed and updated.  PHQ 2/9 Scores 10/16/2017 01/01/2017 05/16/2016 05/02/2016  PHQ - 2 Score 2 4 0 0  PHQ- 9 Score 6 8 - -    Physical Exam  Constitutional: She is oriented to person, place, and time. She appears well-developed. No distress.  HENT:  Head: Normocephalic and atraumatic.  Neck: Normal range of motion. No thyromegaly present.  Cardiovascular: Normal rate, regular rhythm and normal heart sounds.  Pulmonary/Chest: Effort normal and breath sounds normal. No respiratory distress. She has no wheezes.  Abdominal: Soft.  Musculoskeletal: She exhibits edema and tenderness.  Neurological: She is alert and oriented to person, place, and time. No sensory deficit. Gait abnormal.  Skin: Skin is warm and  dry. No rash noted.  Pink coloration distal anterior left shin at site of cellulitis that has healed Persistent 2+ edema with no skin breakdown  Psychiatric: Her behavior is normal. Thought content normal. She exhibits a depressed mood.  Nursing note and vitals reviewed.   BP (!) 144/100   Pulse 80   Ht 4\' 11"  (1.499 m)   Wt 227 lb (103 kg)   BMI 45.85 kg/m   Assessment and Plan: 1. Type 2 DM with CKD stage 4 and hypertension ([place]) Continue diet Recommend Eye exam Will request records from Nephrology - pt is hesitant to return there since studies were unrevealing Close follow up in 2 months - Hemoglobin A1c - AMB Referral to [facility] - Basic metabolic panel  2. Essential (primary) hypertension Resume cozaar - TSH  3. Hyperlipidemia associated with type 2 diabetes mellitus ([place]) Not on statin therapy  4. Venous stasis dermatitis of left lower extremity Resolved cellulitis - AMB Referral to [place] [facility]  5. At high risk for falls - AMB Referral to [place]  6. Vitamin D deficiency Now on high dose supplement since levels were low   No orders of the defined  types were placed in this encounter.   Partially dictated using Editor, commissioning. Any errors are unintentional.  Halina Maidens, MD [facility]  10/16/2017

## 2017-10-17 LAB — TSH: TSH: 2.53 u[IU]/mL (ref 0.450–4.500)

## 2017-10-17 LAB — BASIC METABOLIC PANEL
BUN/Creatinine Ratio: 24 (ref 12–28)
BUN: 32 mg/dL — ABNORMAL HIGH (ref 8–27)
CO2: 19 mmol/L — AB (ref 20–29)
CREATININE: 1.32 mg/dL — AB (ref 0.57–1.00)
Calcium: 9.9 mg/dL (ref 8.7–10.3)
Chloride: 108 mmol/L — ABNORMAL HIGH (ref 96–106)
GFR calc Af Amer: 42 mL/min/{1.73_m2} — ABNORMAL LOW (ref >59)
GFR, EST NON AFRICAN AMERICAN: 36 mL/min/{1.73_m2} — AB (ref >59)
Glucose: 139 mg/dL — ABNORMAL HIGH (ref 65–99)
Potassium: 5.2 mmol/L (ref 3.5–5.2)
SODIUM: 145 mmol/L — AB (ref 134–144)

## 2017-10-17 LAB — HEMOGLOBIN A1C
Est. average glucose Bld gHb Est-mCnc: 148 mg/dL
HEMOGLOBIN A1C: 6.8 % — AB (ref 4.8–5.6)

## 2017-10-27 ENCOUNTER — Other Ambulatory Visit: Payer: Self-pay | Admitting: Internal Medicine

## 2017-10-27 DIAGNOSIS — I10 Essential (primary) hypertension: Secondary | ICD-10-CM

## 2017-10-29 ENCOUNTER — Other Ambulatory Visit: Payer: Self-pay | Admitting: *Deleted

## 2017-10-29 NOTE — Patient Outreach (Signed)
[facility]) Care Management  10/29/2017  Heather Long 1928/02/16 [id number]  MD referral: Reason for consult: Lives alone, frequent falls, Advanced Osteoarthritis, Diabetes Mellitus, Kidney failure:  Telephone call x 1 to patient; left HIPPA compliant voice mail requesting return call.  Plan: Will follow up:  Sherrin Daisy, RN BSN [facility] Coordinator [facility] Care Management  [phone]

## 2017-10-30 ENCOUNTER — Other Ambulatory Visit: Payer: Self-pay | Admitting: *Deleted

## 2017-10-30 NOTE — Patient Outreach (Signed)
[place] [facility]) Care Management  10/30/2017  Heather Long 08-18-1928 [id number]  MD referral: Reason for consult: Lives alone, frequent falls, Advanced Osteoarthritis, Diabetes Mellitus, Kidney failure:  Telephone call to patient; left HIPPA message requesting return call,  Plan: Will follow up.  Sherrin Daisy, RN BSN [place] Management Coordinator [facility] Care Management  [phone]

## 2017-10-31 ENCOUNTER — Other Ambulatory Visit: Payer: Self-pay | Admitting: *Deleted

## 2017-10-31 NOTE — Patient Outreach (Signed)
[place] [facility]) Care Management  10/31/2017  Heather Long Oct 07, 1928 [id number]  MD referral: Reason for consult: Lives alone, frequent falls, Advanced Osteoarthritis, Diabetes Mellitus, Kidney failure:  Received return call from patient who was advised of reason for outreach. Advised of MD referral & [facility] care management services. HIPPA verification received from patient.   Patient states major health concerns are chronic back pain & trying to control pain and continue to do things that she needs to do, kidney problem(seeing nephrologist) and knowing what she needs to do about problem.   States she has see specialist for back pain & recommendation is for surgery. States she does not want surgery. States currently taking tylenol for pain as well as using cool compresses. States she sees acupuncturist also. States she ordered back brace from insurance catalog but the largest size was to small for her. Referred her back to specialist to inquire about back brace & their recommendations. Voices understanding & states she will inquire. States she is unable to get stronger pain medication.   Voices sometimes balance is off so she uses cane & has walker if needed. States does have some neuropathy in feet also. States she continues to drive and takes herself to MD appointments. States she lives alone and has little assistance from others. States has son & daughter-in-law that work with busy jobs.   Assessment: _Patient wears bilateral hearing aids. Had difficulty hearing via telephone. Most of  questions had to be repeated during conversation. -patient living alone -having chronic back pain & lower extremity pain(dx Advanced osteoarthritis, neuropathy -Falls risk -using cane or walker due to pain, previous fall with injuries sustained -needs assistance with how to manage chronic medical conditions -States she has diabetes and states she feels comfortable with her self management of  condition.(A1c below &7.0) States she manages own medication.  -Patient appropriate for complex case management of self management of chronic medical condition.   Patient consents with [facility] care management & would like [facility] care management  home visit.   Plan: Refer to care management assistant to assign Community care coordinator for complex case management   Sherrin Daisy, RN BSN [place] Management Coordinator [facility] Care Management  [phone]

## 2017-11-01 ENCOUNTER — Other Ambulatory Visit: Payer: Self-pay | Admitting: *Deleted

## 2017-11-01 NOTE — Telephone Encounter (Unsigned)
This encounter was created in error - please disregard.

## 2017-11-11 ENCOUNTER — Other Ambulatory Visit: Payer: Self-pay | Admitting: *Deleted

## 2017-11-11 NOTE — Patient Outreach (Signed)
Successful telephone encounter to [name], 83 year old female - follow up on referral received 10/31/17 from [facility] telephonic RN CM for Community CM services/complex case management, original referral from PCP.   Pt's history includes but not limited to Advanced osteoarthritis, DM- on insulin, Chronic kidney disease, neuropathy, back pain, frequent falls.  Spoke with pt, discussed purpose of call- follow up on PCP referral/ [facility] telephonic RN CM as well as HIPAA identifiers verified.   Pt reports having a lot of trouble with arthritis (acting up), settling in right hip, using cane and rollator.    RN CM discussed with pt [facility] services, doing a home visit to which pt agreed.    Plan:  As discussed with pt, plan to follow up later this week with an initial home visit (assess needs).   Zara Chess.   [place] Care Management  [phone]

## 2017-11-15 ENCOUNTER — Other Ambulatory Visit: Payer: Self-pay | Admitting: *Deleted

## 2017-11-15 ENCOUNTER — Encounter: Payer: Self-pay | Admitting: *Deleted

## 2017-11-15 NOTE — Patient Outreach (Signed)
[place] [facility]) Care Management   11/15/2017  Heather Long [birth year] [id number]  Heather Long is an 83 y.o. female  Subjective: Pt reports deals with chronic pain(arthritis),left hip pain started 1.5 weeks agoaffecting her walking,using a cane.  Pt reports when sitting, it is okay, continues with chronic pain in both arms, knees.  Pt reports no recent falls,lives by herself,son/daughter in law check on her.  Pt reports sugars are fine, range 90-112, one day it was 79 which came from not eating enough.   Objective:   Vitals:   11/15/17 1442  BP: (!) 152/70  Pulse: 64  Resp: 16  SpO2: 97%    ROS  Physical Exam  Constitutional: She is oriented to person, place, and time. She appears well-developed and well-nourished.  Cardiovascular: Normal rate, regular rhythm and normal heart sounds.  Respiratory: Effort normal and breath sounds normal.  Musculoskeletal: Normal range of motion. She exhibits edema.  +2 edema to top of both feet/bilateral lower extremities.  Neurological: She is alert and oriented to person, place, and time.  Skin: Skin is warm and dry.  Psychiatric: She has a normal mood and affect. Her behavior is normal. Judgment and thought content normal.    Encounter Medications:   Outpatient Encounter Medications as of 11/15/2017  Medication Sig  . ACCU-CHEK AVIVA PLUS test strip USE AS DIRECTED THREE TIMES DAILY  . acetaminophen (TYLENOL) 325 MG tablet Take 650 mg by mouth every 6 (six) hours as needed.  Marland Kitchen amLODipine (NORVASC) 2.5 MG tablet Take 1 tablet by mouth daily.  . Ascorbic Acid (VITAMIN C) 1000 MG tablet Take 1,000 mg by mouth daily.  . B-D UF III MINI PEN NEEDLES 31G X 5 MM MISC U UTD QD  . Cholecalciferol 5000 units capsule Take 1 capsule (5,000 Units total) by mouth daily.  . diclofenac sodium (VOLTAREN) 1 % GEL   . glucose blood test strip Please dispence test strips and lancets of patient's choice.  Check sugars three times daily   . LANTUS SOLOSTAR 100 UNIT/ML Solostar Pen Inject 34 Units into the skin daily at 10 pm.  . losartan (COZAAR) 25 MG tablet TAKE 1 TABLET BY MOUTH DAILY  . Multiple Vitamins-Minerals (PRESERVISION AREDS PO) Take by mouth.  . Rhodiola 300 MG CAPS 100 mg. Rhodiola: One tablet by mouth once daily.  . vitamin B-12 (CYANOCOBALAMIN) 1000 MCG tablet Take 1 tablet (1,000 mcg total) by mouth daily.  . Vitamin D, Ergocalciferol, (DRISDOL) 50000 units CAPS capsule TK 1 C PO 1 TIME A WK   No facility-administered encounter medications on file as of 11/15/2017.     Functional Status:   In your present state of health, do you have any difficulty performing the following activities: 11/15/2017 10/31/2017  Hearing? - Y  Comment - wears bilateral hearing aids  Vision? - Y  Difficulty concentrating or making decisions? - N  Walking or climbing stairs? - Y  Dressing or bathing? - N  Doing errands, shopping? - Y  Preparing Food and eating ? N -  Using the Toilet? N -  In the past six months, have you accidently leaked urine? Y -  Do you have problems with loss of bowel control? N -  Managing your Medications? N -  Managing your Finances? N -  Housekeeping or managing your Housekeeping? N -  Some recent data might be hidden    Fall/Depression Screening:    Fall Risk  11/15/2017 10/31/2017 10/31/2017  Falls in the past year? No  No -  Comment - - -  Number falls in past yr: - 1 -  Injury with Fall? - - -  Risk Factor Category  - - -  Risk for fall due to : - History of fall(s) History of fall(s)  Risk for fall due to: Comment - fell over 1 year ago and sustained broken wrist , bruises  -  Follow up - Falls prevention discussed -   PHQ 2/9 Scores 11/15/2017 10/31/2017 10/16/2017 01/01/2017 05/16/2016 05/02/2016  PHQ - 2 Score 1 0 2 4 0 0  PHQ- 9 Score - - 6 8 - -    Assessment:  Pleasant 83 year old female, lives alone. Referral received on pt 12/13 From [facility]  Telephonic RN CM who followed on PCP  referral.  Pt's history includes but not limited to Advanced osteoarthritis, DM,CKD, neuropathy, back pain, frequent falls.   Pain- chronic  +5 today both arm/knees.   Acute- right hip, sitting no pain,     Walking have pain (new past 1.5 weeks).  Hypertension:  BP today 152/70, +2 edema in bilateral lower legs/top of feet     Discussed with pt ongoing medication adherence, monitor BP, avoid foods     In sodium.        Plan:  As discussed with pt, plan to continue to provide Community CM services, follow up again next month- home visit.             As discussed, pt to follow up with Orthopedic MD - new onset left   hip pain.             Barrier letter send to Dr. Army Melia informing of [facility] involvement.             Plan to send Dr.Berglund 11/08/17 initial home visit encounter.   [facility] CM Care Plan Problem One     Most Recent Value  Care Plan Problem One  Chronic pain - arthritis/lymphadema   Role Documenting the Problem One  Care Management Coordinator  Care Plan for Problem One  Active  THN Long Term Goal   Pt would have better pain mangement in the next 60 days   [facility]N Long Term Goal Start Date  11/08/17  Interventions for Problem One Long Term Goal  Discussed with pt scheduleding appointment with Orthopedic MD soon   [facility] CM Short Term Goal #1   improved mobility in the next 30 days   [facility] CM Short Term Goal #1 Start Date  11/08/17  Interventions for Short Term Goal #1  Discussed with pt use of walker in addition to cane when ambulating to help improve mobiltiy     [facility] CM Care Plan Problem Two     Most Recent Value  Care Plan Problem Two  Elevated BP- hx of HTN   Role Documenting the Problem Two  Care Management [place] for Problem Two  Active  [facility] CM Short Term Goal #1   Pt would see improvement in BP in the next 30 days   [facility] CM Short Term Goal #1 Start Date  11/08/17  Interventions for Short Term Goal #2   Discussed with pt ongoing monitoring of BP, call MD if continue  to be elevated,reveiwed foods to avoid high in sodium      Heather Long.   [place] Care Management  [phone]

## 2017-12-05 ENCOUNTER — Ambulatory Visit
Admission: EM | Admit: 2017-12-05 | Discharge: 2017-12-05 | Disposition: A | Discharge status: Home / Self Care | Payer: Medicare Other | Attending: Family Medicine | Admitting: Family Medicine

## 2017-12-05 ENCOUNTER — Ambulatory Visit (INDEPENDENT_AMBULATORY_CARE_PROVIDER_SITE_OTHER): Payer: Medicare Other

## 2017-12-05 ENCOUNTER — Other Ambulatory Visit: Payer: Self-pay

## 2017-12-05 DIAGNOSIS — I739 Peripheral vascular disease, unspecified: Secondary | ICD-10-CM

## 2017-12-05 DIAGNOSIS — M25559 Pain in unspecified hip: Secondary | ICD-10-CM

## 2017-12-05 DIAGNOSIS — M25552 Pain in left hip: Secondary | ICD-10-CM

## 2017-12-05 DIAGNOSIS — M456 Ankylosing spondylitis lumbar region: Secondary | ICD-10-CM | POA: Diagnosis not present

## 2017-12-05 DIAGNOSIS — M85852 Other specified disorders of bone density and structure, left thigh: Secondary | ICD-10-CM

## 2017-12-05 DIAGNOSIS — M1612 Unilateral primary osteoarthritis, left hip: Secondary | ICD-10-CM | POA: Diagnosis not present

## 2017-12-05 DIAGNOSIS — M47816 Spondylosis without myelopathy or radiculopathy, lumbar region: Secondary | ICD-10-CM | POA: Diagnosis not present

## 2017-12-05 MED ORDER — TRAMADOL HCL 50 MG PO TABS: 50.0000 mg | ORAL_TABLET | Freq: Three times a day (TID) | ORAL | 0 refills | Status: DC | PRN

## 2017-12-05 NOTE — Discharge Instructions (Signed)
Use the pain medication as needed.  Follow up with your PCP.  Take care  Dr. Lacinda Axon

## 2017-12-05 NOTE — ED Provider Notes (Signed)
[facility]    CSN: [id number] Arrival date & time: 12/05/17  1130  History   Chief Complaint Chief Complaint  Patient presents with  . Hip Pain   HPI  84 year old female presents with complaints of left hip pain.  Patient has a long-standing history of osteoarthritis, osteoporosis, and lumbar stenosis.  Patient reports that she has had worsening left hip pain over the past 3 weeks.  Patient localizes the pain to her left lateral hip and buttock.  She states that her pain is okay when she is at rest but worsens with ambulation.  Pain is currently a 2/10 in severity.  She states that it is 10/10 when she ambulate.  No recent fall, trauma, injury.  She has been taking Tylenol without improvement.  No other associated symptoms.  No other complaints at this time.  Past Medical History:  Diagnosis Date  . Cancer ([place])   . Chronic kidney disease   . Diabetes mellitus without complication ([place])   . Gout   . Hyperlipidemia   . Hypertension   . Lymphedema   . Obesity   . Osteoporosis   . Paget's disease of bone   . Spinal stenosis     Patient Active Problem List   Diagnosis Date Noted  . Secondary hyperparathyroidism of renal origin ([place]) 10/16/2017  . Venous stasis dermatitis of left lower extremity 07/16/2017  . Chronic congestion of paranasal sinus 03/13/2017  . Degeneration macular 05/23/2016  . Osteitis deformans 05/23/2016  . Neoplasm of uncertain behavior of skin of face 05/23/2016  . Psoriasis 05/23/2016  . Vitamin B12 deficiency 05/16/2016  . Obesity, Class III, BMI 40-49.9 (morbid obesity) ([place]) 05/02/2016  . Type 2 DM with CKD stage 4 and hypertension ([place]) 05/02/2016  . Vitamin D deficiency 05/02/2016  . Lower extremity edema 05/02/2016  . Spinal stenosis of lumbar region 05/02/2016  . Essential (primary) hypertension 06/02/2012  . Hyperlipidemia associated with type 2 diabetes mellitus ([place]) 06/02/2012  . Malignant neoplasm of breast ([place]) 06/14/2011    . Generalized OA 06/14/2011  . Lymphedema of arm 06/14/2011  . OP (osteoporosis) 06/14/2011    Past Surgical History:  Procedure Laterality Date  . ABDOMINAL HYSTERECTOMY    . JOINT REPLACEMENT  2000, 2006  . MASTECTOMY    . ORIF WRIST FRACTURE  07/2016    OB History    No data available       Home Medications    Prior to Admission medications   Medication Sig Start Date End Date Taking? Authorizing Provider  ACCU-CHEK AVIVA PLUS test strip USE AS DIRECTED THREE TIMES DAILY 11/16/16   Glean Hess, MD  acetaminophen (TYLENOL) 325 MG tablet Take 650 mg by mouth every 6 (six) hours as needed.    [provider]  Ascorbic Acid (VITAMIN C) 1000 MG tablet Take 1,000 mg by mouth daily.    [provider]  B-D UF III MINI PEN NEEDLES 31G X 5 MM MISC U UTD QD 09/26/17   [provider]  diclofenac sodium (VOLTAREN) 1 % GEL  12/27/16   [provider]  glucose blood test strip Please dispence test strips and lancets of patient's choice.  Check sugars three times daily 10/23/10   [provider]  LANTUS SOLOSTAR 100 UNIT/ML Solostar Pen Inject 34 Units into the skin daily at 10 pm. 05/24/17   Glean Hess, MD  losartan (COZAAR) 25 MG tablet TAKE 1 TABLET BY MOUTH DAILY 10/27/17   Halina Maidens  H, MD  Multiple Vitamins-Minerals (PRESERVISION AREDS PO) Take by mouth.    [provider]  Rhodiola 300 MG CAPS 100 mg. Rhodiola: One tablet by mouth once daily.    [provider]  traMADol (ULTRAM) 50 MG tablet Take 1 tablet (50 mg total) by mouth every 8 (eight) hours as needed. 12/05/17   Coral Spikes, DO  vitamin B-12 (CYANOCOBALAMIN) 1000 MCG tablet Take 1 tablet (1,000 mcg total) by mouth daily. 05/16/16   Plonk, Gwyndolyn Saxon, MD  Vitamin D, Ergocalciferol, (DRISDOL) 50000 units CAPS capsule TK 1 C PO 1 TIME A WK 08/21/17   [provider]    Family History Family History  Problem Relation Age of Onset  . Diabetes  Mother   . Heart disease Father   . Throat cancer Sister   . Heart disease Brother   . Cancer Sister   . Heart disease Brother   . Heart disease Brother     Social History Social History   Tobacco Use  . Smoking status: Former Smoker    Last attempt to quit: 01/02/1952    Years since quitting: 65.9  . Smokeless tobacco: Never Used  Substance Use Topics  . Alcohol use: No  . Drug use: No     Allergies   Ciprofloxacin; Gluten meal; Pioglitazone; and Gabapentin   Review of Systems Review of Systems  Constitutional: Negative.   Musculoskeletal:       Hip pain, back pain.   Physical Exam Triage Vital Signs ED Triage Vitals  Enc Vitals Group     BP 12/05/17 1146 (!) 188/54     Pulse Rate 12/05/17 1146 81     Resp 12/05/17 1146 20     Temp 12/05/17 1146 97.7 F (36.5 C)     Temp Source 12/05/17 1146 Oral     SpO2 12/05/17 1146 98 %     Weight 12/05/17 1145 225 lb (102.1 kg)     Height 12/05/17 1145 4\' 11"  (1.499 m)     Head Circumference --      Peak Flow --      Pain Score 12/05/17 1145 2     Pain Loc --      Pain Edu? --      Excl. in [place]? --    Updated Vital Signs BP (!) 188/54 (BP Location: Right Arm)   Pulse 81   Temp 97.7 F (36.5 C) (Oral)   Resp 20   Ht 4\' 11"  (1.499 m)   Wt 225 lb (102.1 kg)   SpO2 98%   BMI 45.44 kg/m   Physical Exam  Constitutional: She is oriented to person, place, and time.  Morbidly obese elderly female in no acute distress.  Cardiovascular: Normal rate and regular rhythm.  Pulmonary/Chest: Effort normal and breath sounds normal. She has no wheezes. She has no rales.  Musculoskeletal:  L hip -patient with exquisite tenderness upon palpation of the greater trochanter.  Patient also has tenderness palpation of the lumbar spine and paraspinal musculature.  Negative straight leg raise.  Neurological: She is alert and oriented to person, place, and time.  Psychiatric: She has a normal mood and affect. Her behavior is normal.    Nursing note and vitals reviewed.  UC Treatments / Results  Labs (all labs ordered are listed, but only abnormal results are displayed) Labs Reviewed - No data to display  EKG  EKG Interpretation None       Radiology Dg Lumbar Spine Complete  Result  Date: 12/05/2017 CLINICAL DATA:  Hip pain. Prior history of breast cancer. No injury. EXAM: LUMBAR SPINE - COMPLETE 4+ VIEW COMPARISON:  MRI 01/23/2010. FINDINGS: Diffuse osteopenia and degenerative change. Stable mild L3 on L4 anterolisthesis. Findings consistent with ankylosing spondylitis of the lumbar spine with bilateral sacroiliitis. No acute bony abnormality. Aortoiliac and visceral atherosclerotic vascular disease. Stool noted throughout the colon. IMPRESSION: 1. Severe diffuse osteopenia and degenerative change. Stable mild L3 on L4 anterolisthesis. No acute bony abnormality. 2. Findings consistent with ankylosing spondylitis of the lumbar spine with bilateral sacroiliitis. 2.  Aortoiliac and visceral atherosclerotic vascular disease. Electronically Signed   By: Marcello Moores  Register   On: 12/05/2017 12:45   Dg Hip Unilat With Pelvis 2-3 Views Left  Result Date: 12/05/2017 CLINICAL DATA:  Left hip pain.  No injury. EXAM: DG HIP (WITH OR WITHOUT PELVIS) 2-3V LEFT COMPARISON:  No prior. FINDINGS: Degenerative changes lumbar spine, both SI joints, both hips. Diffuse osteopenia. No acute bony abnormality. Peripheral vascular calcification. IMPRESSION: 1. Diffuse osteopenia and degenerative change. No acute bony abnormality identified. 2.  Peripheral vascular disease. Electronically Signed   By: Marcello Moores  Register   On: 12/05/2017 12:40    Procedures Procedures (including critical care time)  Medications Ordered in UC Medications - No data to display   Initial Impression / Assessment and Plan / UC Course  I have reviewed the triage vital signs and the nursing notes.  Pertinent labs & imaging results that were available during my care of  the patient were reviewed by me and considered in my medical decision making (see chart for details).     84 year old female with multiple comorbidities presents with hip pain. Xray with severe degenerative changes.  Findings consistent with ankylosing spondylitis. Vascular disease and OA of the hip.  Patient is not a good surgical candidate.  Her options are limited.  Treating pain with tramadol.  Final Clinical Impressions(s) / UC Diagnoses   Final diagnoses:  Hip pain  Left hip pain    ED Discharge Orders        Ordered    traMADol (ULTRAM) 50 MG tablet  Every 8 hours PRN     12/05/17 1251     Controlled Substance Prescriptions [place] Controlled Substance Registry consulted? No   Coral Spikes, [place] 12/05/17 1802

## 2017-12-05 NOTE — ED Triage Notes (Signed)
Left hip pain x several weeks. Denies fall or injury. Uses a cane for stability. Pain increases to 10/10 with ambulation but doesn't hurt much at rest. Has not seen her PCP regarding the issue

## 2017-12-07 ENCOUNTER — Other Ambulatory Visit: Payer: Self-pay | Admitting: Internal Medicine

## 2017-12-08 ENCOUNTER — Telehealth: Payer: Self-pay

## 2017-12-08 NOTE — Telephone Encounter (Signed)
Called to follow up with patient since visit here at [facility].  Spoke with pt. Patient reports little improvement but states that she had a great experience and was very pleased with care by Dr. Lacinda Axon.  Patient instructed to call back with any questions or concerns. [facility]

## 2017-12-09 ENCOUNTER — Ambulatory Visit (INDEPENDENT_AMBULATORY_CARE_PROVIDER_SITE_OTHER): Payer: Medicare Other

## 2017-12-09 VITALS — BP 138/70 | HR 82 | Temp 97.7°F | Resp 12 | Ht 59.0 in | Wt 225.8 lb

## 2017-12-09 DIAGNOSIS — Z0000 Encounter for general adult medical examination without abnormal findings: Secondary | ICD-10-CM

## 2017-12-09 NOTE — Progress Notes (Signed)
Subjective:   Heather Long is a 84 y.o. female who presents for an Initial Medicare Annual Wellness Visit.  Review of Systems    N/A  Cardiac Risk Factors include: advanced age (>59men, >83 women);diabetes mellitus;dyslipidemia;hypertension;obesity (BMI >30kg/m2);sedentary lifestyle     Objective:    Today's Vitals   12/09/17 0858  Weight: 225 lb 12.8 oz (102.4 kg)  Height: 4\' 11"  (1.499 m)   Body mass index is 45.61 kg/m.  Advanced Directives 12/09/2017 12/05/2017 11/15/2017 05/30/2017 01/01/2017 07/20/2016 03/14/2016  Does Patient Have a Medical Advance Directive? Yes - Yes Yes Yes Yes Yes  Type of Paramedic of Heather Long;Living will Heather Long;Living will Heather Long;Living will Heather Long;Living will Healthcare Power of Heather Long;Living will  Does patient want to make changes to medical advance directive? - - No - Patient declined - - - -  Copy of Heather Long in Chart? No - copy requested - - - - - -    Current Medications (verified) Outpatient Encounter Medications as of 12/09/2017  Medication Sig  . ACCU-CHEK AVIVA PLUS test strip USE AS DIRECTED THREE TIMES DAILY  . acetaminophen (TYLENOL) 325 MG tablet Take 650 mg by mouth every 6 (six) hours as needed.  . Ascorbic Acid (VITAMIN C) 1000 MG tablet Take 1,000 mg by mouth daily.  . B-D UF III MINI PEN NEEDLES 31G X 5 MM MISC U UTD QD  . diclofenac sodium (VOLTAREN) 1 % GEL   . glucose blood test strip Please dispence test strips and lancets of patient's choice.  Check sugars three times daily  . LANTUS SOLOSTAR 100 UNIT/ML Solostar Pen ADMINISTER 34 UNITS UNDER THE SKIN DAILY AT 10 PM  . losartan (COZAAR) 25 MG tablet TAKE 1 TABLET BY MOUTH DAILY  . Multiple Vitamins-Minerals (PRESERVISION AREDS PO) Take by mouth.  . traMADol (ULTRAM) 50 MG tablet Take 1 tablet (50 mg total)  by mouth every 8 (eight) hours as needed.  . vitamin B-12 (CYANOCOBALAMIN) 1000 MCG tablet Take 1 tablet (1,000 mcg total) by mouth daily.  . Vitamin D, Ergocalciferol, (DRISDOL) 50000 units CAPS capsule TK 1 C PO 1 TIME A WK  . [DISCONTINUED] Rhodiola 300 MG CAPS 100 mg. Rhodiola: One tablet by mouth once daily.   No facility-administered encounter medications on file as of 12/09/2017.     Allergies (verified) Ciprofloxacin; Gluten meal; Pioglitazone; and Gabapentin   History: Past Medical History:  Diagnosis Date  . Cancer ([place])   . Chronic kidney disease   . Diabetes mellitus without complication ([place])   . Gout   . Hyperlipidemia   . Hypertension   . Lymphedema   . Obesity   . Osteoporosis   . Paget's disease of bone   . Spinal stenosis    Past Surgical History:  Procedure Laterality Date  . ABDOMINAL HYSTERECTOMY    . JOINT REPLACEMENT  2000, 2006  . MASTECTOMY    . ORIF WRIST FRACTURE  07/2016   Family History  Problem Relation Age of Onset  . Diabetes Mother   . Cancer Mother   . Heart disease Father   . Throat cancer Sister   . Heart disease Brother   . Diabetes Brother   . Cancer Sister   . Heart disease Brother   . Heart disease Brother    Social History   Socioeconomic History  . Marital status: Divorced    Spouse  name: None  . Number of children: 2  . Years of education: None  . Highest education level: 12th grade  Social Needs  . Financial resource strain: Not hard at all  . Food insecurity - worry: Never true  . Food insecurity - inability: Never true  . Transportation needs - medical: No  . Transportation needs - non-medical: No  Occupational History  . Occupation: Retired  Tobacco Use  . Smoking status: Former Smoker    Packs/day: 0.25    Years: 2.00    Pack years: 0.50    Types: Cigarettes    Last attempt to quit: 01/02/1952    Years since quitting: 65.9  . Smokeless tobacco: Never Used  . Tobacco comment: smoking cessation materials  not required  Substance and Sexual Activity  . Alcohol use: No  . Drug use: No  . Sexual activity: Not Currently  Other Topics Concern  . None  Social History Narrative  . None    Tobacco Counseling Counseling given: No Comment: smoking cessation materials not required   Clinical Intake:  Pre-visit preparation completed: Yes  Pain : No/denies pain   BMI - recorded: 45.61 Nutritional Status: BMI > 30  Obese Nutritional Risks: None Diabetes: Yes CBG done?: No Did pt. bring in CBG monitor from home?: No  How often do you need to have someone help you when you read instructions, pamphlets, or other written materials from your doctor or pharmacy?: 1 - Never  Interpreter Needed?: No  Information entered by :: AEversole, LPN  Activities of Daily Living In your present state of health, do you have any difficulty performing the following activities: 12/09/2017 11/15/2017  Hearing? Y -  Comment bilateral hearing aids -  Vision? N -  Comment wears eyeglasses -  Difficulty concentrating or making decisions? Y -  Comment short term memory loss -  Walking or climbing stairs? N -  Dressing or bathing? N -  Doing errands, shopping? N -  Preparing Food and eating ? N N  Comment partial upper and lower dentures -  Using the Toilet? N N  In the past six months, have you accidently leaked urine? Y Y  Comment wears pads -  Do you have problems with loss of bowel control? N N  Managing your Medications? N N  Managing your Finances? N N  Housekeeping or managing your Housekeeping? N N  Some recent data might be hidden     Immunizations and Health Maintenance Immunization History  Administered Date(s) Administered  . Influenza,inj,quad, With Preservative 12/20/2016  . Pneumococcal Conjugate-13 05/02/2016  . Pneumococcal-Unspecified 12/20/2016  . Td 05/01/2016   Health Maintenance Due  Topic Date Due  . OPHTHALMOLOGY EXAM  11/19/2016    Patient Care Team: Glean Hess, MD as PCP - General (Family Medicine) [facility] as Consulting Physician (Orthopedic Surgery)  Indicate any recent Medical Services you may have received from other than [facility] providers in the past year (date may be approximate).     Assessment:   This is a routine wellness examination for Heather Long.  Hearing/Vision screen Vision Screening Comments: Sees Dr. Edison Pace for annual eye exams  Dietary issues and exercise activities discussed: Current Exercise Habits: The patient does not participate in regular exercise at present, Exercise limited by: orthopedic condition(s)(hip pain)  Goals    . DIET - INCREASE WATER INTAKE     Recommend to drink at least 6-8 8oz glasses of water per day.       Depression Screen  PHQ 2/9 Scores 12/09/2017 11/15/2017 10/31/2017 10/16/2017 01/01/2017 05/16/2016 05/02/2016  PHQ - 2 Score 2 1 0 2 4 0 0  PHQ- 9 Score 7 - - 6 8 - -    Fall Risk Fall Risk  12/09/2017 11/15/2017 10/31/2017 10/31/2017 10/16/2017  Falls in the past year? No No No - No  Comment - - - - -  Number falls in past yr: - - 1 - -  Injury with Fall? - - - - -  Risk Factor Category  - - - - -  Risk for fall due to : History of fall(s);Impaired balance/gait;Impaired vision - History of fall(s) History of fall(s) -  Risk for fall due to: Comment walks with cane; wears eyeglasses - fell over 1 year ago and sustained broken wrist , bruises  - -  Follow up - - Falls prevention discussed - -    Is the patient's home free of loose throw rugs in walkways, pet beds, electrical cords, etc?   Yes Does the patient have any grab bars in the bathroom? No  Does the patient use a shower chair when bathing? No Does the patient have any stairs in or around the home? No If so, are there any handrails?  No Does the patient have adequate lighting?  Yes Does the patient use a cane, walker or w/c? Yes, use of cane Does the patient use of an elevated toilet seat? No  Timed Get Up and Go Performed: Yes.  Pt ambulated 10 feet within25 sec. Gait slow, steady and with use of an assistive device. No intervention required at this time. Fall risk prevention has been discussed.  Cognitive Function:     6CIT Screen 12/09/2017  What Year? 0 points  What month? 0 points  What time? 0 points  Count back from 20 0 points  Months in reverse 0 points  Repeat phrase 4 points  Total Score 4    Screening Tests Health Maintenance  Topic Date Due  . OPHTHALMOLOGY EXAM  11/19/2016  . INFLUENZA VACCINE  03/11/2018 (Originally 06/19/2017)  . HEMOGLOBIN A1C  04/15/2018  . FOOT EXAM  10/16/2018  . TETANUS/TDAP  05/01/2026  . DEXA SCAN  Completed  . PNA vac Low Risk Adult  Completed    Qualifies for Shingles Vaccine? Yes. Due for Zostavax or Shingrix vaccine. Education has been provided regarding the importance of this vaccine. Pt has been advised to call her insurance company to determine her out of pocket expense. Advised she may also receive this vaccine at her local pharmacy or Health Dept. Verbalized acceptance and understanding.  Due for Flu vaccine. Declined my offer to administer today. Education has been provided regarding the importance of this vaccine but still declined. Pt has been advised to call our office is she should change her mind. Verbalized acceptance and understanding.  Cancer Screenings: Lung: Low Dose CT Chest recommended if Age 77-80 years, 30 pack-year currently smoking OR have quit w/in 15years. Patient does not qualify. Breast: Up to date on Mammogram? No. Mammograms no longer required Up to date of Bone Density/Dexa? Yes. Osteoporotic screening no longer required Colorectal: Colorectal screenings no longer required.  Additional Screenings: Hepatitis B/HIV/Syphillis: Does not qualify  Hepatitis C Screening: Does not qualify    Plan:  I have personally reviewed and addressed the Medicare Annual Wellness questionnaire and have noted the following in the patient's chart:   A. Medical and social history B. Use of alcohol, tobacco or illicit drugs  C. Current  medications and supplements D. Functional ability and status E.  Nutritional status F.  Physical activity G. Advance directives H. List of other physicians I.  Hospitalizations, surgeries, and ER visits in previous 12 months J.  Olivia Lopez de Gutierrez such as hearing and vision if needed, cognitive and depression L. Referrals and appointments - none  In addition, I have reviewed and discussed with patient certain preventive protocols, quality metrics, and best practice recommendations. A written personalized care plan for preventive services as well as general preventive health recommendations were provided to patient.  Signed,  Aleatha Borer, LPN Nurse Health Advisor  MD Recommendations: Due for Zostavax or Shingrix vaccine. Education has been provided regarding the importance of this vaccine. Pt has been advised to call her insurance company to determine her out of pocket expense. Advised she may also receive this vaccine at her local pharmacy or Health Dept. Verbalized acceptance and understanding.  Due for Flu vaccine. Declined my offer to administer today. Education has been provided regarding the importance of this vaccine but still declined. Pt has been advised to call our office is she should change her mind. Verbalized acceptance and understanding.  Overdue for diabetic eye exam. Last performed on 11/20/15. Pt has been advised about the importance in completing this exam. Advised to schedule an appt with Dr. Edison Pace to complete this exam. Once completed, reminded pt to provide the office with a copy of her diabetic eye exam. Verbalized acceptance and understanding.

## 2017-12-09 NOTE — Addendum Note (Signed)
Addended by: Hardie Pulley, Vence Lalor J on: 12/09/2017 02:22 PM   Modules accepted: Miquel Dunn

## 2017-12-09 NOTE — Patient Instructions (Signed)
[name] , Thank you for taking time to come for your Medicare Wellness Visit. I appreciate your ongoing commitment to your health goals. Please review the following plan we discussed and let me know if I can assist you in the future.   Screening recommendations/referrals: Colonoscopy: No longer required Mammogram: No longer required Bone Density: No longer required Recommended yearly ophthalmology/optometry visit for glaucoma screening and checkup Recommended yearly dental visit for hygiene and checkup  Vaccinations: Influenza vaccine: Up to date Pneumococcal vaccine: Completed series Tdap vaccine: Up to date Shingles vaccine: Declined. Please call your insurance company to determine your out of pocket expense. You may also receive this vaccine at your local pharmacy or Health Dept.  Advanced directives: Please bring a copy of your POA (Power of Attorney) and/or Living Will to your next appointment.   Conditions/risks identified: Recommend to drink at least 6-8 8oz glasses of water per day.  Next appointment: You are scheduled to see Dr. Army Melia on 12/16/17 @ 10:15am.   Please schedule your Annual Wellness Visit with your Nurse Health Advisor in one year.  Preventive Care 82 Years and Older, Female Preventive care refers to lifestyle choices and visits with your health care provider that can promote health and wellness. What does preventive care include?  A yearly physical exam. This is also called an annual well check.  Dental exams once or twice a year.  Routine eye exams. Ask your health care provider how often you should have your eyes checked.  Personal lifestyle choices, including:  Daily care of your teeth and gums.  Regular physical activity.  Eating a healthy diet.  Avoiding tobacco and drug use.  Limiting alcohol use.  Practicing safe sex.  Taking low-dose aspirin every day.  Taking vitamin and mineral supplements as recommended by your health care  provider. What happens during an annual well check? The services and screenings done by your health care provider during your annual well check will depend on your age, overall health, lifestyle risk factors, and family history of disease. Counseling  Your health care provider may ask you questions about your:  Alcohol use.  Tobacco use.  Drug use.  Emotional well-being.  Home and relationship well-being.  Sexual activity.  Eating habits.  History of falls.  Memory and ability to understand (cognition).  Work and work Statistician.  Reproductive health. Screening  You may have the following tests or measurements:  Height, weight, and BMI.  Blood pressure.  Lipid and cholesterol levels. These may be checked every 5 years, or more frequently if you are over 82 years old.  Skin check.  Lung cancer screening. You may have this screening every year starting at age 82 if you have a 30-pack-year history of smoking and currently smoke or have quit within the past 15 years.  Fecal occult blood test (FOBT) of the stool. You may have this test every year starting at age 82.  Flexible sigmoidoscopy or colonoscopy. You may have a sigmoidoscopy every 5 years or a colonoscopy every 10 years starting at age 82.  Hepatitis C blood test.  Hepatitis B blood test.  Sexually transmitted disease (STD) testing.  Diabetes screening. This is done by checking your blood sugar (glucose) after you have not eaten for a while (fasting). You may have this done every 1-3 years.  Bone density scan. This is done to screen for osteoporosis. You may have this done starting at age 82.  Mammogram. This may be done every 1-2 years. Talk to  your health care provider about how often you should have regular mammograms. Talk with your health care provider about your test results, treatment options, and if necessary, the need for more tests. Vaccines  Your health care provider may recommend certain  vaccines, such as:  Influenza vaccine. This is recommended every year.  Tetanus, diphtheria, and acellular pertussis (Tdap, Td) vaccine. You may need a Td booster every 10 years.  Zoster vaccine. You may need this after age 50.  Pneumococcal 13-valent conjugate (PCV13) vaccine. One dose is recommended after age 82.  Pneumococcal polysaccharide (PPSV23) vaccine. One dose is recommended after age 82. Talk to your health care provider about which screenings and vaccines you need and how often you need them. This information is not intended to replace advice given to you by your health care provider. Make sure you discuss any questions you have with your health care provider. Document Released: 12/02/2015 Document Revised: 07/25/2016 Document Reviewed: 09/06/2015 Elsevier Interactive Patient Education  2017 [employer] Prevention in the Home Falls can cause injuries. They can happen to people of all ages. There are many things you can do to make your home safe and to help prevent falls. What can I do on the outside of my home?  Regularly fix the edges of walkways and driveways and fix any cracks.  Remove anything that might make you trip as you walk through a door, such as a raised step or threshold.  Trim any bushes or trees on the path to your home.  Use bright outdoor lighting.  Clear any walking paths of anything that might make someone trip, such as rocks or tools.  Regularly check to see if handrails are loose or broken. Make sure that both sides of any steps have handrails.  Any raised decks and porches should have guardrails on the edges.  Have any leaves, snow, or ice cleared regularly.  Use sand or salt on walking paths during winter.  Clean up any spills in your garage right away. This includes oil or grease spills. What can I do in the bathroom?  Use night lights.  Install grab bars by the toilet and in the tub and shower. Do not use towel bars as grab  bars.  Use non-skid mats or decals in the tub or shower.  If you need to sit down in the shower, use a plastic, non-slip stool.  Keep the floor dry. Clean up any water that spills on the floor as soon as it happens.  Remove soap buildup in the tub or shower regularly.  Attach bath mats securely with double-sided non-slip rug tape.  Do not have throw rugs and other things on the floor that can make you trip. What can I do in the bedroom?  Use night lights.  Make sure that you have a light by your bed that is easy to reach.  Do not use any sheets or blankets that are too big for your bed. They should not hang down onto the floor.  Have a firm chair that has side arms. You can use this for support while you get dressed.  Do not have throw rugs and other things on the floor that can make you trip. What can I do in the kitchen?  Clean up any spills right away.  Avoid walking on wet floors.  Keep items that you use a lot in easy-to-reach places.  If you need to reach something above you, use a strong step stool that has  a grab bar.  Keep electrical cords out of the way.  Do not use floor polish or wax that makes floors slippery. If you must use wax, use non-skid floor wax.  Do not have throw rugs and other things on the floor that can make you trip. What can I do with my stairs?  Do not leave any items on the stairs.  Make sure that there are handrails on both sides of the stairs and use them. Fix handrails that are broken or loose. Make sure that handrails are as long as the stairways.  Check any carpeting to make sure that it is firmly attached to the stairs. Fix any carpet that is loose or worn.  Avoid having throw rugs at the top or bottom of the stairs. If you do have throw rugs, attach them to the floor with carpet tape.  Make sure that you have a light switch at the top of the stairs and the bottom of the stairs. If you do not have them, ask someone to add them for  you. What else can I do to help prevent falls?  Wear shoes that:  Do not have high heels.  Have rubber bottoms.  Are comfortable and fit you well.  Are closed at the toe. Do not wear sandals.  If you use a stepladder:  Make sure that it is fully opened. Do not climb a closed stepladder.  Make sure that both sides of the stepladder are locked into place.  Ask someone to hold it for you, if possible.  Clearly mark and make sure that you can see:  Any grab bars or handrails.  First and last steps.  Where the edge of each step is.  Use tools that help you move around (mobility aids) if they are needed. These include:  Canes.  Walkers.  Scooters.  Crutches.  Turn on the lights when you go into a dark area. Replace any light bulbs as soon as they burn out.  Set up your furniture so you have a clear path. Avoid moving your furniture around.  If any of your floors are uneven, fix them.  If there are any pets around you, be aware of where they are.  Review your medicines with your doctor. Some medicines can make you feel dizzy. This can increase your chance of falling. Ask your doctor what other things that you can do to help prevent falls. This information is not intended to replace advice given to you by your health care provider. Make sure you discuss any questions you have with your health care provider. Document Released: 09/01/2009 Document Revised: 04/12/2016 Document Reviewed: 12/10/2014 Elsevier Interactive Patient Education  2017 [employer].

## 2017-12-12 ENCOUNTER — Emergency Department: Payer: Medicare Other

## 2017-12-12 ENCOUNTER — Encounter: Payer: Self-pay | Admitting: Emergency Medicine

## 2017-12-12 ENCOUNTER — Emergency Department
Admission: EM | Admit: 2017-12-12 | Discharge: 2017-12-12 | Disposition: A | Discharge status: Home / Self Care | Payer: Medicare Other | Attending: Emergency Medicine | Admitting: Emergency Medicine

## 2017-12-12 DIAGNOSIS — W19XXXA Unspecified fall, initial encounter: Secondary | ICD-10-CM | POA: Diagnosis not present

## 2017-12-12 DIAGNOSIS — Z853 Personal history of malignant neoplasm of breast: Secondary | ICD-10-CM | POA: Insufficient documentation

## 2017-12-12 DIAGNOSIS — Y92000 Kitchen of unspecified non-institutional (private) residence as  the place of occurrence of the external cause: Secondary | ICD-10-CM | POA: Insufficient documentation

## 2017-12-12 DIAGNOSIS — Z87891 Personal history of nicotine dependence: Secondary | ICD-10-CM | POA: Insufficient documentation

## 2017-12-12 DIAGNOSIS — S0003XA Contusion of scalp, initial encounter: Secondary | ICD-10-CM | POA: Diagnosis not present

## 2017-12-12 DIAGNOSIS — I129 Hypertensive chronic kidney disease with stage 1 through stage 4 chronic kidney disease, or unspecified chronic kidney disease: Secondary | ICD-10-CM | POA: Insufficient documentation

## 2017-12-12 DIAGNOSIS — N184 Chronic kidney disease, stage 4 (severe): Secondary | ICD-10-CM | POA: Diagnosis not present

## 2017-12-12 DIAGNOSIS — S0990XA Unspecified injury of head, initial encounter: Secondary | ICD-10-CM | POA: Diagnosis not present

## 2017-12-12 DIAGNOSIS — S0093XA Contusion of unspecified part of head, initial encounter: Secondary | ICD-10-CM | POA: Diagnosis not present

## 2017-12-12 DIAGNOSIS — Y999 Unspecified external cause status: Secondary | ICD-10-CM | POA: Insufficient documentation

## 2017-12-12 DIAGNOSIS — Z79899 Other long term (current) drug therapy: Secondary | ICD-10-CM | POA: Insufficient documentation

## 2017-12-12 DIAGNOSIS — Z794 Long term (current) use of insulin: Secondary | ICD-10-CM | POA: Diagnosis not present

## 2017-12-12 DIAGNOSIS — E1122 Type 2 diabetes mellitus with diabetic chronic kidney disease: Secondary | ICD-10-CM | POA: Insufficient documentation

## 2017-12-12 DIAGNOSIS — Y939 Activity, unspecified: Secondary | ICD-10-CM | POA: Insufficient documentation

## 2017-12-12 NOTE — ED Triage Notes (Signed)
Pt comes into the ED via [facility] from home where she had a mechanical fall from using her cane and she fell to the side into the metal cabinet in her kitchen.  Denies LOC or blood thinners, and patient in NAD at this time and is at her baseline according to her.  No laceration present but there is a hematoma on the right posterior and side of the head.

## 2017-12-12 NOTE — ED Notes (Signed)
See triage note  States she fell  Hit her head on metal cabinet   No LOC  Large hematoma to back of head

## 2017-12-12 NOTE — ED Provider Notes (Signed)
[facility] Emergency Department Provider Note  ____________________________________________  Time seen: Approximately 7:46 PM  I have reviewed the triage vital signs and the nursing notes.   HISTORY  Chief Complaint Fall    HPI Heather Long is a 84 y.o. female presents to the emergency department after falling tonight in her home.  Patient reports that she fell against a metal cabinet in her kitchen.  Patient did not lose consciousness.  She denies new blurry vision, nausea, vomiting or disorientation.  She has been ambulating without difficulty with the assistance of her cane.  Patient reports that she currently lives alone but her son and her daughter-in-law live only a couple of miles from her.  Patient denies weakness, radiculopathy or changes in sensation in the upper or lower extremities.  No skin compromise of the head.   Past Medical History:  Diagnosis Date  . Cancer ([place])   . Chronic kidney disease   . Diabetes mellitus without complication ([place])   . Gout   . Hyperlipidemia   . Hypertension   . Lymphedema   . Obesity   . Osteoporosis   . Paget's disease of bone   . Spinal stenosis     Patient Active Problem List   Diagnosis Date Noted  . Secondary hyperparathyroidism of renal origin ([place]) 10/16/2017  . Venous stasis dermatitis of left lower extremity 07/16/2017  . Chronic congestion of paranasal sinus 03/13/2017  . Degeneration macular 05/23/2016  . Osteitis deformans 05/23/2016  . Neoplasm of uncertain behavior of skin of face 05/23/2016  . Psoriasis 05/23/2016  . Vitamin B12 deficiency 05/16/2016  . Obesity, Class III, BMI 40-49.9 (morbid obesity) ([place]) 05/02/2016  . Type 2 DM with CKD stage 4 and hypertension ([place]) 05/02/2016  . Vitamin D deficiency 05/02/2016  . Lower extremity edema 05/02/2016  . Spinal stenosis of lumbar region 05/02/2016  . Essential (primary) hypertension 06/02/2012  . Hyperlipidemia associated with type  2 diabetes mellitus ([place]) 06/02/2012  . Malignant neoplasm of breast ([place]) 06/14/2011  . Generalized OA 06/14/2011  . Lymphedema of arm 06/14/2011  . OP (osteoporosis) 06/14/2011    Past Surgical History:  Procedure Laterality Date  . ABDOMINAL HYSTERECTOMY    . JOINT REPLACEMENT  2000, 2006  . MASTECTOMY    . ORIF WRIST FRACTURE  07/2016    Prior to Admission medications   Medication Sig Start Date End Date Taking? Authorizing Provider  ACCU-CHEK AVIVA PLUS test strip USE AS DIRECTED THREE TIMES DAILY 11/16/16   Glean Hess, MD  acetaminophen (TYLENOL) 325 MG tablet Take 650 mg by mouth every 6 (six) hours as needed.    [provider]  Ascorbic Acid (VITAMIN C) 1000 MG tablet Take 1,000 mg by mouth daily.    [provider]  B-D UF III MINI PEN NEEDLES 31G X 5 MM MISC U UTD QD 09/26/17   [provider]  diclofenac sodium (VOLTAREN) 1 % GEL  12/27/16   [provider]  glucose blood test strip Please dispence test strips and lancets of patient's choice.  Check sugars three times daily 10/23/10   [provider]  LANTUS SOLOSTAR 100 UNIT/ML Solostar Pen ADMINISTER 34 UNITS UNDER THE SKIN DAILY AT 10 PM 12/08/17   Glean Hess, MD  losartan (COZAAR) 25 MG tablet TAKE 1 TABLET BY MOUTH DAILY 10/27/17   Glean Hess, MD  Multiple Vitamins-Minerals (PRESERVISION AREDS PO) Take by mouth.    [provider]  traMADol Veatrice Bourbon) 50  MG tablet Take 1 tablet (50 mg total) by mouth every 8 (eight) hours as needed. 12/05/17   Coral Spikes, DO  vitamin B-12 (CYANOCOBALAMIN) 1000 MCG tablet Take 1 tablet (1,000 mcg total) by mouth daily. 05/16/16   Plonk, Gwyndolyn Saxon, MD  Vitamin D, Ergocalciferol, (DRISDOL) 50000 units CAPS capsule TK 1 C PO 1 TIME A WK 08/21/17   [provider]    Allergies Ciprofloxacin; Gluten meal; Pioglitazone; and Gabapentin  Family History  Problem Relation Age of Onset  . Diabetes Mother   . Cancer  Mother   . Heart disease Father   . Throat cancer Sister   . Heart disease Brother   . Diabetes Brother   . Cancer Sister   . Heart disease Brother   . Heart disease Brother     Social History Social History   Tobacco Use  . Smoking status: Former Smoker    Packs/day: 0.25    Years: 2.00    Pack years: 0.50    Types: Cigarettes    Last attempt to quit: 01/02/1952    Years since quitting: 65.9  . Smokeless tobacco: Never Used  . Tobacco comment: smoking cessation materials not required  Substance Use Topics  . Alcohol use: No  . Drug use: No     Review of Systems  Constitutional: No fever/chills Eyes: No visual changes. No discharge ENT: No upper respiratory complaints. Cardiovascular: no chest pain. Respiratory: no cough. No SOB. Gastrointestinal: No abdominal pain.  No nausea, no vomiting.  No diarrhea.  No constipation. Genitourinary: Negative for dysuria. No hematuria Musculoskeletal: Negative for musculoskeletal pain. Skin: Negative for rash, abrasions, lacerations, ecchymosis. Neurological: Negative for headaches, focal weakness or numbness.   ____________________________________________   PHYSICAL EXAM:  VITAL SIGNS: ED Triage Vitals [12/12/17 1821]  Enc Vitals Group     BP (!) 177/71     Pulse Rate 79     Resp 15     Temp 98.5 F (36.9 C)     Temp Source Oral     SpO2 97 %     Weight 225 lb (102.1 kg)     Height 4\' 11"  (1.499 m)     Head Circumference      Peak Flow      Pain Score 6     Pain Loc      Pain Edu?      Excl. in [place]?      Constitutional: Alert and oriented. Well appearing and in no acute distress. Eyes: Conjunctivae are normal. PERRL. EOMI. Head: Atraumatic. ENT:      Ears: TMs are pearly bilaterally.       Nose: No congestion/rhinnorhea.      Mouth/Throat: Mucous membranes are moist.  Neck: No stridor.  No cervical spine tenderness to palpation. Hematological/Lymphatic/Immunilogical: No cervical lymphadenopathy.   Cardiovascular: Normal rate, regular rhythm. Normal S1 and S2.  Good peripheral circulation. Respiratory: Normal respiratory effort without tachypnea or retractions. Lungs CTAB. Good air entry to the bases with no decreased or absent breath sounds. Gastrointestinal: Bowel sounds 4 quadrants. Soft and nontender to palpation. No guarding or rigidity. No palpable masses. No distention. No CVA tenderness. Musculoskeletal: Full range of motion to all extremities. No gross deformities appreciated. Neurologic:  Normal speech and language. No gross focal neurologic deficits are appreciated.  Skin:  Skin is warm, dry and intact. No rash noted. Psychiatric: Mood and affect are normal. Speech and behavior are normal. Patient exhibits appropriate insight and judgement.   ____________________________________________  LABS (all labs ordered are listed, but only abnormal results are displayed)  Labs Reviewed - No data to display ____________________________________________  EKG   ____________________________________________  RADIOLOGY Unk Pinto, personally viewed and evaluated these images (plain radiographs) as part of my medical decision making, as well as reviewing the written report by the radiologist.  Ct Head Wo Contrast  Result Date: 12/12/2017 CLINICAL DATA:  Recent fall with scalp hematoma, initial encounter EXAM: CT HEAD WITHOUT CONTRAST TECHNIQUE: Contiguous axial images were obtained from the base of the skull through the vertex without intravenous contrast. COMPARISON:  07/20/2016 FINDINGS: Brain: Diffuse atrophic changes are identified. Mild chronic white matter ischemic change is seen. No findings to suggest acute hemorrhage, acute infarction or space-occupying mass lesion are noted. Vascular: No hyperdense vessel or unexpected calcification. Skull: Normal. Negative for fracture or focal lesion. Sinuses/Orbits: No acute finding. Other: Scalp hematoma is noted in the right  posterior parietal region near the vertex consistent with the recent injury. It measures approximately 2.6 cm in greatest dimension. IMPRESSION: Chronic atrophic and ischemic changes without acute intracranial abnormality Scalp hematoma on the right is noted consistent with the physical exam. Electronically Signed   By: Inez Catalina M.D.   On: 12/12/2017 18:46    ____________________________________________    PROCEDURES  Procedure(s) performed:    Procedures    Medications - No data to display   ____________________________________________   INITIAL IMPRESSION / ASSESSMENT AND PLAN / ED COURSE  Pertinent labs & imaging results that were available during my care of the patient were reviewed by me and considered in my medical decision making (see chart for details).  Review of the [place] CSRS was performed in accordance of the [place] prior to dispensing any controlled drugs.     Assessment and Plan: Head Contusion: Patient presents to the emergency department after falling against a metal cabinet in her kitchen.  Differential diagnosis included subdural hematoma, facial contusion and laceration.  Physical exam and overall neurologic exam is reassuring.  CT head revealed no acute abnormality.  Tylenol was recommended for discomfort.  Return precautions were given.  All patient questions were answered.     ____________________________________________  FINAL CLINICAL IMPRESSION(S) / ED DIAGNOSES  Final diagnoses:  Fall, initial encounter      NEW MEDICATIONS STARTED DURING THIS VISIT:  ED Discharge Orders    None          This chart was dictated using voice recognition software/Dragon. Despite best efforts to proofread, errors can occur which can change the meaning. Any change was purely unintentional.    Lannie Fields, PA-C 12/12/17 1952    Harvest Dark, MD 12/12/17 2216

## 2017-12-14 ENCOUNTER — Other Ambulatory Visit: Payer: Self-pay | Admitting: Internal Medicine

## 2017-12-16 ENCOUNTER — Encounter: Payer: Self-pay | Admitting: Internal Medicine

## 2017-12-16 ENCOUNTER — Ambulatory Visit (INDEPENDENT_AMBULATORY_CARE_PROVIDER_SITE_OTHER): Payer: Medicare Other | Admitting: Internal Medicine

## 2017-12-16 VITALS — BP 136/80 | HR 79 | Ht 59.0 in

## 2017-12-16 DIAGNOSIS — E1122 Type 2 diabetes mellitus with diabetic chronic kidney disease: Secondary | ICD-10-CM

## 2017-12-16 DIAGNOSIS — I10 Essential (primary) hypertension: Secondary | ICD-10-CM | POA: Diagnosis not present

## 2017-12-16 DIAGNOSIS — N2581 Secondary hyperparathyroidism of renal origin: Secondary | ICD-10-CM | POA: Diagnosis not present

## 2017-12-16 DIAGNOSIS — N184 Chronic kidney disease, stage 4 (severe): Secondary | ICD-10-CM

## 2017-12-16 DIAGNOSIS — S0003XA Contusion of scalp, initial encounter: Secondary | ICD-10-CM

## 2017-12-16 DIAGNOSIS — I129 Hypertensive chronic kidney disease with stage 1 through stage 4 chronic kidney disease, or unspecified chronic kidney disease: Secondary | ICD-10-CM | POA: Diagnosis not present

## 2017-12-16 NOTE — Progress Notes (Signed)
Date:  12/16/2017   Name:  Heather Long   DOB:  1928-01-10   MRN:  [id number]   Chief Complaint: renal insufficiency (follow up on kidney function.) Hypertension  This is a chronic problem. The problem has been waxing and waning since onset. Pertinent negatives include no chest pain, headaches, palpitations or shortness of breath. Risk factors for coronary artery disease include diabetes mellitus. Past treatments include angiotensin blockers. Hypertensive end-organ damage includes kidney disease.   Lab Results  Component Value Date   HGBA1C 6.8 (H) 10/16/2017   Contusion - fell at home several days ago.  Sustained a contusion of her scalp and strained her right shoulder.  She was given tramadol but it did not help.  She continues to take Tylenol.  She gets around with a cane and manages to do minimal housework.  She does some cooking but it is a struggle.  Renal Insuff - avoiding all nsaids.  No dysuria.  LE edema is stable.  Due for recheck of labs.  Lab Results  Component Value Date   CREATININE 1.32 (H) 10/16/2017   BUN 32 (H) 10/16/2017   NA 145 (H) 10/16/2017   K 5.2 10/16/2017   CL 108 (H) 10/16/2017   CO2 19 (L) 10/16/2017   Lab Results  Component Value Date   HGBA1C 6.8 (H) 10/16/2017    Review of Systems  Constitutional: Negative for chills, fatigue and fever.  Eyes: Negative for visual disturbance.  Respiratory: Negative for chest tightness, shortness of breath and wheezing.   Cardiovascular: Positive for leg swelling. Negative for chest pain and palpitations.  Gastrointestinal: Negative for nausea and vomiting.  Musculoskeletal: Positive for arthralgias, back pain and gait problem.  Skin: Negative for color change and rash.  Neurological: Negative for dizziness, light-headedness and headaches.  Hematological: Negative for adenopathy.  Psychiatric/Behavioral: Negative for sleep disturbance.    Patient Active Problem List   Diagnosis Date Noted  .  Secondary hyperparathyroidism of renal origin ([place]) 10/16/2017  . Venous stasis dermatitis of left lower extremity 07/16/2017  . Chronic congestion of paranasal sinus 03/13/2017  . Degeneration macular 05/23/2016  . Osteitis deformans 05/23/2016  . Neoplasm of uncertain behavior of skin of face 05/23/2016  . Psoriasis 05/23/2016  . Vitamin B12 deficiency 05/16/2016  . Obesity, Class III, BMI 40-49.9 (morbid obesity) ([place]) 05/02/2016  . Type 2 DM with CKD stage 4 and hypertension ([place]) 05/02/2016  . Vitamin D deficiency 05/02/2016  . Lower extremity edema 05/02/2016  . Spinal stenosis of lumbar region 05/02/2016  . Essential (primary) hypertension 06/02/2012  . Hyperlipidemia associated with type 2 diabetes mellitus ([place]) 06/02/2012  . Malignant neoplasm of breast ([place]) 06/14/2011  . Generalized OA 06/14/2011  . Lymphedema of arm 06/14/2011  . OP (osteoporosis) 06/14/2011    Prior to Admission medications   Medication Sig Start Date End Date Taking? Authorizing Provider  ACCU-CHEK AVIVA PLUS test strip USE AS DIRECTED THREE TIMES DAILY 11/16/16  Yes Glean Hess, MD  acetaminophen (TYLENOL) 325 MG tablet Take 650 mg by mouth every 6 (six) hours as needed.   Yes [provider]  Ascorbic Acid (VITAMIN C) 1000 MG tablet Take 1,000 mg by mouth daily.   Yes [provider]  B-D UF III MINI PEN NEEDLES 31G X 5 MM MISC U UTD QD 09/26/17  Yes [provider]  diclofenac sodium (VOLTAREN) 1 % GEL  12/27/16  Yes [provider]  glucose blood test strip Please dispence test strips  and lancets of patient's choice.  Check sugars three times daily 10/23/10  Yes [provider]  LANTUS SOLOSTAR 100 UNIT/ML Solostar Pen ADMINISTER 34 UNITS UNDER THE SKIN DAILY AT 10 PM Patient taking differently: ADMINISTER 32 UNITS UNDER THE SKIN DAILY AT 10 PM 12/08/17  Yes Glean Hess, MD  losartan (COZAAR) 25 MG tablet TAKE 1 TABLET BY MOUTH DAILY 10/27/17  Yes  Glean Hess, MD  Multiple Vitamins-Minerals (PRESERVISION AREDS PO) Take by mouth.   Yes [provider]  traMADol (ULTRAM) 50 MG tablet Take 1 tablet (50 mg total) by mouth every 8 (eight) hours as needed. 12/05/17  Yes Cook, Jayce G, DO  vitamin B-12 (CYANOCOBALAMIN) 1000 MCG tablet Take 1 tablet (1,000 mcg total) by mouth daily. 05/16/16  Yes Plonk, Gwyndolyn Saxon, MD  Vitamin D, Ergocalciferol, (DRISDOL) 50000 units CAPS capsule TK 1 C PO 1 TIME A WK 08/21/17  Yes [provider]    Allergies  Allergen Reactions  . Ciprofloxacin Nausea And Vomiting  . Gluten Meal Other (See Comments)    GI distress  . Pioglitazone Other (See Comments)    Leg swelling  . Gabapentin Diarrhea, Nausea Only and Other (See Comments)    dizziness    Past Surgical History:  Procedure Laterality Date  . ABDOMINAL HYSTERECTOMY    . JOINT REPLACEMENT  2000, 2006  . MASTECTOMY    . ORIF WRIST FRACTURE  07/2016    Social History   Tobacco Use  . Smoking status: Former Smoker    Packs/day: 0.25    Years: 2.00    Pack years: 0.50    Types: Cigarettes    Last attempt to quit: 01/02/1952    Years since quitting: 66.0  . Smokeless tobacco: Never Used  . Tobacco comment: smoking cessation materials not required  Substance Use Topics  . Alcohol use: No  . Drug use: No     Medication list has been reviewed and updated.  PHQ 2/9 Scores 12/09/2017 11/15/2017 10/31/2017 10/16/2017  PHQ - 2 Score 2 1 0 2  PHQ- 9 Score 7 - - 6    Physical Exam  Constitutional: She is oriented to person, place, and time. She appears well-developed. No distress.  HENT:  Head: Normocephalic and atraumatic.  Neck: Normal range of motion. Neck supple. No thyromegaly present.  Cardiovascular: Normal rate, regular rhythm and normal heart sounds.  Pulmonary/Chest: Effort normal and breath sounds normal. No respiratory distress. She has no wheezes.  Musculoskeletal: She exhibits edema and tenderness. She  exhibits no deformity.       Right shoulder: She exhibits decreased range of motion and tenderness.       Right knee: She exhibits normal range of motion.       Left knee: She exhibits normal range of motion.  Neurological: She is alert and oriented to person, place, and time.  Skin: Skin is warm and dry. No rash noted.     Psychiatric: She has a normal mood and affect. Her behavior is normal. Thought content normal.  Nursing note and vitals reviewed.   BP 136/80   Pulse 79   Ht 4\' 11"  (1.499 m)   SpO2 97%   BMI 45.44 kg/m   Assessment and Plan: 1. Essential (primary) hypertension Improved Continue current medications  2. Type 2 DM with CKD stage 4 and hypertension ([place]) controlled  3. Secondary hyperparathyroidism of renal origin ([place])  4. Obesity, Class III, BMI 40-49.9 (morbid obesity) ([place]) Discussed diet, unable to  exercise  5. Contusion of scalp, initial encounter CT and XR negative   No orders of the defined types were placed in this encounter.   Partially dictated using Editor, commissioning. Any errors are unintentional.  Halina Maidens, MD [facility]  12/16/2017

## 2017-12-16 NOTE — Patient Instructions (Signed)
Try taking one Tylenol with one Tramadol for pain

## 2017-12-17 ENCOUNTER — Other Ambulatory Visit: Payer: Self-pay | Admitting: *Deleted

## 2017-12-17 VITALS — BP 160/72 | HR 79 | Resp 20 | Ht 59.0 in | Wt 225.0 lb

## 2017-12-17 DIAGNOSIS — G894 Chronic pain syndrome: Secondary | ICD-10-CM

## 2017-12-17 LAB — BASIC METABOLIC PANEL
BUN/Creatinine Ratio: 23 (ref 12–28)
BUN: 32 mg/dL — ABNORMAL HIGH (ref 8–27)
CO2: 21 mmol/L (ref 20–29)
CREATININE: 1.37 mg/dL — AB (ref 0.57–1.00)
Calcium: 9.7 mg/dL (ref 8.7–10.3)
Chloride: 105 mmol/L (ref 96–106)
GFR calc Af Amer: 40 mL/min/{1.73_m2} — ABNORMAL LOW (ref >59)
GFR, EST NON AFRICAN AMERICAN: 35 mL/min/{1.73_m2} — AB (ref >59)
GLUCOSE: 128 mg/dL — AB (ref 65–99)
Potassium: 5.1 mmol/L (ref 3.5–5.2)
SODIUM: 148 mmol/L — AB (ref 134–144)

## 2017-12-17 NOTE — Patient Outreach (Signed)
[facility]) Care Management   12/17/2017  Heather Long 01/14/1928 [id number]  Heather Long is an 84 y.o. female  Subjective:  Pt reports on recent Urgent care visit for pain in left hip, was  Informed has plaque in arteries/legs/pelvis, given Tramadol which did  Not work.  Pt also reports on recent ED visit- result of fall at home when [facility] gave way-  contusion to head, pain in  right  shoulder 2 days later.   Pt reports has OTC pain patches but unable to apply to  Shoulder by herself, daughter in law currently dealing with her own  Health issues.  Pt reports on visit with Dr. Army Melia yesterday, let  It out with MD everything that has been bothering/going on with her.  Pt reports wants to stay at home but needs help.     Objective:  Vitals:   12/17/17 1035  BP: (!) 160/72  Pulse: 79  Resp: 20  SpO2: 97%     ROS  Physical Exam  Constitutional: She is oriented to person, place, and time. She appears well-developed and well-nourished.  Cardiovascular: Normal rate, regular rhythm and normal heart sounds.  Respiratory: Effort normal and breath sounds normal.  GI: Soft. Bowel sounds are normal.  Musculoskeletal: Normal range of motion. She exhibits edema.  +2 bilateral lower extremity edema/top of feet.   Neurological: She is alert and oriented to person, place, and time.  Skin: Skin is warm and dry.  Psychiatric: She has a normal mood and affect. Her behavior is normal. Judgment and thought content normal.    Encounter Medications:   Outpatient Encounter Medications as of 12/17/2017  Medication Sig  . ACCU-CHEK AVIVA PLUS test strip USE AS DIRECTED THREE TIMES DAILY  . acetaminophen (TYLENOL) 325 MG tablet Take 650 mg by mouth every 6 (six) hours as needed.  . Ascorbic Acid (VITAMIN C) 1000 MG tablet Take 1,000 mg by mouth daily.  . B-D UF III MINI PEN NEEDLES 31G X 5 MM MISC U UTD QD  . diclofenac sodium (VOLTAREN) 1 % GEL   . glucose blood test  strip Please dispence test strips and lancets of patient's choice.  Check sugars three times daily  . LANTUS SOLOSTAR 100 UNIT/ML Solostar Pen ADMINISTER 34 UNITS UNDER THE SKIN DAILY AT 10 PM (Patient taking differently: ADMINISTER 32 UNITS UNDER THE SKIN DAILY AT 10 PM)  . losartan (COZAAR) 25 MG tablet TAKE 1 TABLET BY MOUTH DAILY  . Multiple Vitamins-Minerals (PRESERVISION AREDS PO) Take by mouth.  . traMADol (ULTRAM) 50 MG tablet Take 1 tablet (50 mg total) by mouth every 8 (eight) hours as needed.  . vitamin B-12 (CYANOCOBALAMIN) 1000 MCG tablet Take 1 tablet (1,000 mcg total) by mouth daily.  . Vitamin D, Ergocalciferol, (DRISDOL) 50000 units CAPS capsule TK 1 C PO 1 TIME A WK   No facility-administered encounter medications on file as of 12/17/2017.     Functional Status:   In your present state of health, do you have any difficulty performing the following activities: 12/09/2017 11/15/2017  Hearing? Y -  Comment bilateral hearing aids -  Vision? N -  Comment wears eyeglasses -  Difficulty concentrating or making decisions? Y -  Comment short term memory loss -  Walking or climbing stairs? N -  Dressing or bathing? N -  Doing errands, shopping? N -  Preparing Food and eating ? N N  Comment partial upper and lower dentures -  Using the Toilet? N N  In the past six months, have you accidently leaked urine? Y Y  Comment wears pads -  Do you have problems with loss of bowel control? N N  Managing your Medications? N N  Managing your Finances? N N  Housekeeping or managing your Housekeeping? N N  Some recent data might be hidden    Fall/Depression Screening:    Fall Risk  12/09/2017 11/15/2017 10/31/2017  Falls in the past year? No No No  Comment - - -  Number falls in past yr: - - 1  Injury with Fall? - - -  Risk Factor Category  - - -  Risk for fall due to : History of fall(s);Impaired balance/gait;Impaired vision - History of fall(s)  Risk for fall due to: Comment walks  with cane; wears eyeglasses - fell over 1 year ago and sustained broken wrist , bruises   Follow up - - Falls prevention discussed   PHQ 2/9 Scores 12/09/2017 11/15/2017 10/31/2017 10/16/2017 01/01/2017 05/16/2016 05/02/2016  PHQ - 2 Score 2 1 0 2 4 0 0  PHQ- 9 Score 7 - - 6 8 - -    Assessment:  Pleasant 84 year old female, lives alone, son and daughter in  [place] limited in providing assistance.  Pt's history includes but limited to  DM, Malignant neoplasm of left breast, Lymphedema of left arm, Hypertension, Osteoporosis. Fall risk: recent ED visit for fall- head contusion, right shoulder pain.      Adjusted pt's new quad cane to size- had pt ambulate,per pt felt      Safer.  Pt also ordered new shower seat- neighbor to put together.  Pain:  Acute- right shoulder from recent fall, per pt request applied      OTC pain patch ([employer]- 5% menthol medication patch)      Purchased through her  insurance catalog.  RN CM reviewed with      Pt proper use of OTC pain patch, added to pt's medication list.     Hypertension:  Pt's BP today- 160/72 (prior to taking am medications)   Plan:  As discussed with pt, plan to send referral to [place] assistance, community resources.              As discussed with pt, plan to continue to provide Community RN  CM services, follow up again next month- home visit.   THN CM Care Plan Problem One     Most Recent Value  Care Plan Problem One  Chronic pain- arthritis, lymphedema   Role Documenting the Problem One  Care Management Coordinator  Care Plan for Problem One  Active  THN Long Term Goal   Pt would have better pain mangement in the next 60 days   THN Long Term Goal Start Date  11/08/17  Interventions for Problem One Long Term Goal  Discussed with pt current pain status- per pt left shoulder down arm from recent fall, per pt's request applied OTC pain patch   THN CM Short Term Goal #1   improved mobility in the next 30 days   THN CM Short  Term Goal #1 Start Date  11/08/17  [facility] CM Short Term Goal #1 Met Date  12/17/17 [not in time frame ]  Interventions for Short Term Goal #1  Adjusted pt's quad cane (recently obtained) to size.    THN CM Short Term Goal #2   Pt would not have any recurrent falls in the next 30 days  THN CM Short Term Goal #2 Start Date  12/17/17  Interventions for Short Term Goal #2  Discussed with pt recent ED visit from fall, use of new quad cane at all tiimes as previous cane not safe     THN CM Care Plan Problem Two     Most Recent Value  Care Plan Problem Two  Elevated BP- hx of HTN   Role Documenting the Problem Two  Care Management Coordinator  Care Plan for Problem Two  Active  THN CM Short Term Goal #1    Re established -Pt would see improvement in BP in the next 30 days   THN CM Short Term Goal #1 Start Date  12/17/17  Interventions for Short Term Goal #2   Discussed with pt ongoing medication adherence- per pt within norms at recent PCP visit      Zara Chess.   [place] Care Management  [phone]

## 2017-12-18 ENCOUNTER — Other Ambulatory Visit: Payer: Self-pay | Admitting: *Deleted

## 2017-12-18 DIAGNOSIS — N183 Chronic kidney disease, stage 3 (moderate): Secondary | ICD-10-CM | POA: Diagnosis not present

## 2017-12-18 DIAGNOSIS — E1122 Type 2 diabetes mellitus with diabetic chronic kidney disease: Secondary | ICD-10-CM | POA: Diagnosis not present

## 2017-12-18 DIAGNOSIS — I10 Essential (primary) hypertension: Secondary | ICD-10-CM | POA: Diagnosis not present

## 2017-12-18 DIAGNOSIS — N2581 Secondary hyperparathyroidism of renal origin: Secondary | ICD-10-CM | POA: Diagnosis not present

## 2017-12-18 NOTE — Patient Outreach (Signed)
[facility]) Care Management  12/18/2017  Heather Long [birth year] [id number]    Patient referred to this social worker to review community resources and in home assistance needs. Per patient, she is alone and it is hard to get her house work done due to her physical problems Patient states that she recently fell and bumped her head and shoulder. She has pain patches that need to be changed intermittently and she is having difficulty doing this on her own.  Patient discussed feeling left out and lonely. Patient has a son and daughter but states that they are very busy "They help when they can". Increasing patient's socialization recommended, however patient states that the [place] in her town is not active and the [facility] is too far for her to drive. Patient states that she still drives, is able to complete her own ADL's but her biggest problem is changing out her pain patch. Per patient, she is not able to afford paying for a in home aid, she declined Meals on Wheels referral "I am allergic to wheat"  Per patient, her main issue is managing her pain and being able to apply the pain patch when needed. This Education officer, museum further suggested a neighbor or close friend to assist with changing out her pain patch. Per patient, she will think about asking a neighbor but is hesitant.  Patient verbalized having no additional community resource needs. This Education officer, museum will discuss alternative recommendations regarding pain management with RNCM.    Heather Long [facility] Care Management [phone]

## 2017-12-24 ENCOUNTER — Other Ambulatory Visit: Payer: Self-pay | Admitting: Internal Medicine

## 2018-01-03 ENCOUNTER — Other Ambulatory Visit: Payer: Self-pay | Admitting: *Deleted

## 2018-01-03 NOTE — Patient Outreach (Signed)
[facility]) Care Management  01/03/2018  Elany Felix [birth year] [id number]   Care coordination phone call to [facility]. Patient's main concern of changing her pain patch discussed. This Education officer, museum discussed the recommendation of requesting help from a neighbor or friend. Patient verbalized having noa dditional community resource needs. RNCM will contact patient.  This social worker to close patient to social work at this time.   Sheralyn Boatman [facility] Care Management [phone]

## 2018-01-14 DIAGNOSIS — M75101 Unspecified rotator cuff tear or rupture of right shoulder, not specified as traumatic: Secondary | ICD-10-CM | POA: Diagnosis not present

## 2018-01-16 ENCOUNTER — Other Ambulatory Visit: Payer: Self-pay | Admitting: *Deleted

## 2018-01-16 NOTE — Patient Outreach (Signed)
[facility]) Care Management   01/16/2018  Macala Baldonado 03-14-1928 [id number]  Jacquelyne Quarry is an 84 y.o. female  Subjective:  Pt reports saw Carlynn Spry PA at [facility] recently for pain in right  Shoulder/result of fall last month.  Pt reports was told has torn cuff, need to strengthen  Other muscles around it, to go to outpatient therapy 3/6- orientation.    Pt reports taking  Tylenol for pain, helps some, able to put pain patches on but cannot take off.   RN CM  Discussed with pt use of Voltaren- on her medication list to which pt said will  need to  find it, willing to try.   Pt reports recently saw Kidney MD, increased her  Losartan to 50 mg daily, also started on Amlodipine 2.5 mg.  Pt reports checking  BP once a week, 2 days ago was 139/67.   Pt reports no recurrent falls, son and daughter in law brought me a new mattress.   Pt reports sugars doing good, today 106.     Objective:   Vitals:   01/16/18 1122 01/16/18 1137  BP: (!) 142/74 (!) 151/78  Pulse: 77 73  Resp: 20   SpO2: 97%     ROS  Physical Exam  Constitutional: She is oriented to person, place, and time. She appears well-developed and well-nourished.  Cardiovascular: Normal rate, regular rhythm and normal heart sounds.  Respiratory: Effort normal and breath sounds normal.  GI: Soft.  Musculoskeletal: Normal range of motion.  Neurological: She is alert and oriented to person, place, and time.  Skin: Skin is warm and dry.  Psychiatric: She has a normal mood and affect. Her behavior is normal. Judgment and thought content normal.    Encounter Medications:   Outpatient Encounter Medications as of 01/16/2018  Medication Sig Note  . ACCU-CHEK AVIVA PLUS test strip USE AS DIRECTED THREE TIMES DAILY   . acetaminophen (TYLENOL) 325 MG tablet Take 650 mg by mouth every 6 (six) hours as needed.   Marland Kitchen amLODipine (NORVASC) 2.5 MG tablet Take 2.5 mg by mouth daily.   . Ascorbic Acid (VITAMIN  C) 1000 MG tablet Take 1,000 mg by mouth daily.   . B-D UF III MINI PEN NEEDLES 31G X 5 MM MISC USE AS DIRECTED EVERY DAY   . Cholecalciferol (VITAMIN D3) 5000 units CAPS Take by mouth. Daily   . glucose blood test strip Please dispence test strips and lancets of patient's choice.  Check sugars three times daily   . LANTUS SOLOSTAR 100 UNIT/ML Solostar Pen ADMINISTER 34 UNITS UNDER THE SKIN DAILY AT 10 PM (Patient taking differently: ADMINISTER 32 UNITS UNDER THE SKIN DAILY AT 10 PM)   . losartan (COZAAR) 50 MG tablet Take 50 mg by mouth daily.   . Multiple Vitamins-Minerals (PRESERVISION AREDS PO) Take by mouth. 12/17/2017: Per pt takes 2 tablets daily   . vitamin B-12 (CYANOCOBALAMIN) 1000 MCG tablet Take 1 tablet (1,000 mcg total) by mouth daily.   . diclofenac sodium (VOLTAREN) 1 % GEL    . Liniments (ACE PAIN RELIEVING PATCH EX) Apply topically. As needed. 12/17/2017: Pt has a different brand- Good sense extra strength medicated patch Menthol 5 %.    . losartan (COZAAR) 25 MG tablet TAKE 1 TABLET BY MOUTH DAILY (Patient not taking: Reported on 01/16/2018)   . traMADol (ULTRAM) 50 MG tablet Take 1 tablet (50 mg total) by mouth every 8 (eight) hours as needed. (Patient not taking: Reported  on 12/17/2017) 01/16/2018: Per pt has not picked up from pharmacy yet.   . Vitamin D, Ergocalciferol, (DRISDOL) 50000 units CAPS capsule TK 1 C PO 1 TIME A WK    No facility-administered encounter medications on file as of 01/16/2018.     Functional Status:   In your present state of health, do you have any difficulty performing the following activities: 12/09/2017 11/15/2017  Hearing? Y -  Comment bilateral hearing aids -  Vision? N -  Comment wears eyeglasses -  Difficulty concentrating or making decisions? Y -  Comment short term memory loss -  Walking or climbing stairs? N -  Dressing or bathing? N -  Doing errands, shopping? N -  Preparing Food and eating ? N N  Comment partial upper and lower  dentures -  Using the Toilet? N N  In the past six months, have you accidently leaked urine? Y Y  Comment wears pads -  Do you have problems with loss of bowel control? N N  Managing your Medications? N N  Managing your Finances? N N  Housekeeping or managing your Housekeeping? N N  Some recent data might be hidden    Fall/Depression Screening:    Fall Risk  12/09/2017 11/15/2017 10/31/2017  Falls in the past year? No No No  Comment - - -  Number falls in past yr: - - 1  Injury with Fall? - - -  Risk Factor Category  - - -  Risk for fall due to : History of fall(s);Impaired balance/gait;Impaired vision - History of fall(s)  Risk for fall due to: Comment walks with cane; wears eyeglasses - fell over 1 year ago and sustained broken wrist , bruises   Follow up - - Falls prevention discussed   PHQ 2/9 Scores 12/09/2017 11/15/2017 10/31/2017 10/16/2017 01/01/2017 05/16/2016 05/02/2016  PHQ - 2 Score 2 1 0 2 4 0 0  PHQ- 9 Score 7 - - 6 8 - -    Assessment:  Pleasant 84 year old female, lives alone.   Son and daughter in law limited in assistance.  Per pt doing better than when RN CM first came.   Chronic pain-  Right shoulder pain/result of recent fall- +4 today on pain      Scale.    Recent follow up at [facility]- informed torn cuff, to go to      Outpatient therapy 3/6.     Hypertension:  BP today with nurse's cuff 142/74 RA, recheck with pt's       Wrist BP machine 151/78 RA.    Pt did not take am medications yet.  Fall risk: per pt no recurrent falls, using walker to ambulate.   RN CM discussed with pt plan to discharge from [facility] CM services,  Goal met to which pt agreed.    Plan:  As discussed with pt, plan to discharge from [facility] CM services,  Goals met.              Plan to inform Dr.  Army Melia of discharge, send case closure letter.             Plan to inform [facility] CMA to close case.    THN CM Care Plan Problem One     Most Recent Value  Care Plan Problem One   Chronic pain- arthritis, lymphedema   Role Documenting the Problem One  Care Management [place] for Problem One  Active  THN Long Term Goal  Pt would have better pain mangement in the next 60 days   THN Long Term Goal Start Date  11/08/17  [facility] Long Term Goal Met Date  01/16/18  Interventions for Problem One Long Term Goal  Discussed with pt current pain status- per pt left shoulder down arm from recent fall, per pt's request applied OTC pain patch   THN CM Short Term Goal #1   improved mobility in the next 30 days   THN CM Short Term Goal #1 Start Date  11/08/17  [facility] CM Short Term Goal #1 Met Date  12/17/17 [not in time frame ]  Interventions for Short Term Goal #1  Adjusted pt's quad cane (recently obtained) to size.    THN CM Short Term Goal #2   Pt would not have any recurrent falls in the next 30 days   THN CM Short Term Goal #2 Start Date  12/17/17  [facility] CM Short Term Goal #2 Met Date  01/16/18  Interventions for Short Term Goal #2  Discussed with pt recent ED visit from fall, use of new quad cane at all tiimes as previous cane not safe     THN CM Care Plan Problem Two     Most Recent Value  Care Plan Problem Two  Elevated BP- hx of HTN   Role Documenting the Problem Two  Care Management Coordinator  Care Plan for Problem Two  Active  THN CM Short Term Goal #1    Re established -Pt would see improvement in BP in the next 30 days   THN CM Short Term Goal #1 Start Date  12/17/17  [facility] CM Short Term Goal #1 Met Date   01/16/18  Interventions for Short Term Goal #2   Discussed with pt ongoing medication adherence, Low Na+ diet      Channing Savich M.   [place] Care Management  [phone]

## 2018-01-17 ENCOUNTER — Encounter: Payer: Self-pay | Admitting: *Deleted

## 2018-01-19 ENCOUNTER — Encounter: Payer: Self-pay | Admitting: Gynecology

## 2018-01-19 ENCOUNTER — Ambulatory Visit (INDEPENDENT_AMBULATORY_CARE_PROVIDER_SITE_OTHER): Payer: Medicare Other

## 2018-01-19 ENCOUNTER — Ambulatory Visit
Admission: EM | Admit: 2018-01-19 | Discharge: 2018-01-19 | Disposition: A | Discharge status: Home / Self Care | Payer: Medicare Other | Attending: Family Medicine | Admitting: Family Medicine

## 2018-01-19 ENCOUNTER — Other Ambulatory Visit: Payer: Self-pay

## 2018-01-19 DIAGNOSIS — M19011 Primary osteoarthritis, right shoulder: Secondary | ICD-10-CM

## 2018-01-19 DIAGNOSIS — M25511 Pain in right shoulder: Secondary | ICD-10-CM

## 2018-01-19 DIAGNOSIS — S4991XA Unspecified injury of right shoulder and upper arm, initial encounter: Secondary | ICD-10-CM | POA: Diagnosis not present

## 2018-01-19 MED ORDER — DICLOFENAC SODIUM 1 % TD GEL: 4.0000 g | Freq: Four times a day (QID) | TRANSDERMAL | 0 refills | Status: DC

## 2018-01-19 NOTE — ED Provider Notes (Signed)
[facility]    CSN: [id number] Arrival date & time: 01/19/18  6237     History   Chief Complaint Chief Complaint  Patient presents with  . Shoulder Pain    HPI Heather Long is a 84 y.o. female.   84 year old female presents with right shoulder pain. She fell on 12/12/17 and hit her head and landed on her right side. She was seen at [facility] ER for head contusion but her right shoulder was not hurting at the time of the visit.  A few days later, she started having more right shoulder pain and now is unable to lift her arm more than 45 degrees. She has tried warm and cool compresses, hot pepper cream and Tylenol with minimal relief. Pain radiates up toward her neck and mid-way down her right deltoid. She denies any fever, numbness, or weakness. She has no previous injury to her right shoulder but did fracture her right wrist about 2 years ago. She is concerned today over possible fracture and requests x-ray. Current chronic health issues include HTN, DM, hyperlipidemia, chronic kidney disease, arthritis, and  Osteoporosis and is on multiple medications daily for her chronic conditions.    The history is provided by the patient.    Past Medical History:  Diagnosis Date  . Cancer ([place])   . Chronic kidney disease   . Diabetes mellitus without complication ([place])   . Gout   . Hyperlipidemia   . Hypertension   . Lymphedema   . Obesity   . Osteoporosis   . Paget's disease of bone   . Spinal stenosis     Patient Active Problem List   Diagnosis Date Noted  . Secondary hyperparathyroidism of renal origin ([place]) 10/16/2017  . Venous stasis dermatitis of left lower extremity 07/16/2017  . Chronic congestion of paranasal sinus 03/13/2017  . Degeneration macular 05/23/2016  . Osteitis deformans 05/23/2016  . Neoplasm of uncertain behavior of skin of face 05/23/2016  . Psoriasis 05/23/2016  . Vitamin B12 deficiency 05/16/2016  . Obesity, Class III, BMI 40-49.9 (morbid  obesity) ([place]) 05/02/2016  . Type 2 DM with CKD stage 4 and hypertension ([place]) 05/02/2016  . Vitamin D deficiency 05/02/2016  . Lower extremity edema 05/02/2016  . Spinal stenosis of lumbar region 05/02/2016  . Essential (primary) hypertension 06/02/2012  . Hyperlipidemia associated with type 2 diabetes mellitus ([place]) 06/02/2012  . Malignant neoplasm of breast ([place]) 06/14/2011  . Generalized OA 06/14/2011  . Lymphedema of arm 06/14/2011  . OP (osteoporosis) 06/14/2011    Past Surgical History:  Procedure Laterality Date  . ABDOMINAL HYSTERECTOMY    . JOINT REPLACEMENT  2000, 2006  . MASTECTOMY    . ORIF WRIST FRACTURE  07/2016    OB History    No data available       Home Medications    Prior to Admission medications   Medication Sig Start Date End Date Taking? Authorizing Provider  ACCU-CHEK AVIVA PLUS test strip USE AS DIRECTED THREE TIMES DAILY 11/16/16  Yes Glean Hess, MD  acetaminophen (TYLENOL) 325 MG tablet Take 650 mg by mouth every 6 (six) hours as needed.   Yes [provider]  amLODipine (NORVASC) 2.5 MG tablet Take 2.5 mg by mouth daily.   Yes [provider]  Ascorbic Acid (VITAMIN C) 1000 MG tablet Take 1,000 mg by mouth daily.   Yes [provider]  B-D UF III MINI PEN NEEDLES 31G X 5 MM MISC USE AS  DIRECTED EVERY DAY 12/24/17  Yes Glean Hess, MD  Cholecalciferol (VITAMIN D3) 5000 units CAPS Take by mouth. Daily   Yes [provider]  glucose blood test strip Please dispence test strips and lancets of patient's choice.  Check sugars three times daily 10/23/10  Yes [provider]  LANTUS SOLOSTAR 100 UNIT/ML Solostar Pen ADMINISTER 34 UNITS UNDER THE SKIN DAILY AT 10 PM Patient taking differently: ADMINISTER 32 UNITS UNDER THE SKIN DAILY AT 10 PM 12/08/17  Yes Glean Hess, MD  Liniments (ACE PAIN RELIEVING PATCH EX) Apply topically. As needed.   Yes [provider]  losartan (COZAAR) 50 MG  tablet Take 50 mg by mouth daily.   Yes [provider]  Multiple Vitamins-Minerals (PRESERVISION AREDS PO) Take by mouth.   Yes [provider]  traMADol (ULTRAM) 50 MG tablet Take 1 tablet (50 mg total) by mouth every 8 (eight) hours as needed. 12/05/17  Yes Cook, Jayce G, DO  vitamin B-12 (CYANOCOBALAMIN) 1000 MCG tablet Take 1 tablet (1,000 mcg total) by mouth daily. 05/16/16  Yes Plonk, Gwyndolyn Saxon, MD  Vitamin D, Ergocalciferol, (DRISDOL) 50000 units CAPS capsule TK 1 C PO 1 TIME A WK 08/21/17  Yes [provider]  diclofenac sodium (VOLTAREN) 1 % GEL Apply 4 g topically 4 (four) times daily. To affected area as needed 01/19/18   Ottis Sarnowski, Nicholes Stairs, NP    Family History Family History  Problem Relation Age of Onset  . Diabetes Mother   . Cancer Mother   . Heart disease Father   . Throat cancer Sister   . Heart disease Brother   . Diabetes Brother   . Cancer Sister   . Heart disease Brother   . Heart disease Brother     Social History Social History   Tobacco Use  . Smoking status: Former Smoker    Packs/day: 0.25    Years: 2.00    Pack years: 0.50    Types: Cigarettes    Last attempt to quit: 01/02/1952    Years since quitting: 66.0  . Smokeless tobacco: Never Used  . Tobacco comment: smoking cessation materials not required  Substance Use Topics  . Alcohol use: No  . Drug use: No     Allergies   Ciprofloxacin; Gluten meal; Pioglitazone; and Gabapentin   Review of Systems Review of Systems  Constitutional: Negative for appetite change, chills, fatigue and fever.  HENT: Negative for ear discharge, facial swelling and nosebleeds.   Eyes: Negative for photophobia and visual disturbance.  Respiratory: Negative for cough, shortness of breath and wheezing.   Gastrointestinal: Negative for nausea and vomiting.  Genitourinary: Negative for flank pain and hematuria.  Musculoskeletal: Positive for arthralgias and myalgias. Negative for neck pain and  neck stiffness.  Skin: Positive for wound.  Neurological: Negative for dizziness, tremors, seizures, syncope, weakness, light-headedness, numbness and headaches.  Hematological: Negative for adenopathy. Does not bruise/bleed easily.     Physical Exam Triage Vital Signs ED Triage Vitals  Enc Vitals Group     BP 01/19/18 1034 (!) 160/72     Pulse Rate 01/19/18 1034 84     Resp 01/19/18 1034 18     Temp 01/19/18 1034 97.9 F (36.6 C)     Temp Source 01/19/18 1034 Oral     SpO2 01/19/18 1034 97 %     Weight 01/19/18 1032 225 lb (102.1 kg)     Height --      Head Circumference --  Peak Flow --      Pain Score 01/19/18 1031 6     Pain Loc --      Pain Edu? --      Excl. in [place]? --    No data found.  Updated Vital Signs BP (!) 160/72 (BP Location: Right Arm)   Pulse 84   Temp 97.9 F (36.6 C) (Oral)   Resp 18   Wt 225 lb (102.1 kg)   SpO2 97%   BMI 45.44 kg/m   Visual Acuity Right Eye Distance:   Left Eye Distance:   Bilateral Distance:    Right Eye Near:   Left Eye Near:    Bilateral Near:     Physical Exam  Constitutional: She is oriented to person, place, and time. She appears well-developed and well-nourished. No distress.  HENT:  Head: Normocephalic. Head is with contusion. Head is without laceration. Hair is normal.    Right Ear: Hearing and external ear normal.  Left Ear: Hearing and external ear normal.  Nose: Nose normal.  Small hematoma still present on anterior central parietal area of scalp. Slightly crusted over and tender. No surrounding erythema.   Eyes: Conjunctivae and EOM are normal.  Neck: Normal range of motion. Neck supple.  Cardiovascular: Normal rate and regular rhythm.  Pulmonary/Chest: Effort normal.  Musculoskeletal: She exhibits tenderness.       Right shoulder: She exhibits decreased range of motion, tenderness and pain. She exhibits no swelling, no effusion, no deformity, no spasm, normal pulse and normal strength.        Arms: Decreased range of motion of right shoulder, particularly with abduction and pronation. Unable to lift her right shoulder more than 45 to 60 degrees. Very tender along the anterior aspect of her shoulder. No posterior involvement. Good strength and tone. No numbness or neuro deficits noted. Good distal pulses.   Neurological: She is alert and oriented to person, place, and time. She has normal strength. No sensory deficit.  Skin: Skin is warm and dry. Capillary refill takes less than 2 seconds. No rash noted.  Psychiatric: She has a normal mood and affect. Her speech is normal and behavior is normal. Thought content normal.     UC Treatments / Results  Labs (all labs ordered are listed, but only abnormal results are displayed) Labs Reviewed - No data to display  EKG  EKG Interpretation None       Radiology Dg Shoulder Right  Result Date: 01/19/2018 CLINICAL DATA:  Pt states she fell two weeks ago. Still having pain, limited movement right shoulder. Pain is top of shoulder EXAM: RIGHT SHOULDER - 2+ VIEW COMPARISON:  None. FINDINGS: No fracture or bone lesion. Glenohumeral joint is normally spaced and aligned. Mild narrowing of the [facility] joint with small marginal osteophytes. Bones are diffusely demineralized. Soft tissues are unremarkable. IMPRESSION: 1. No fracture or dislocation. 2. Mild AC joint osteoarthritis. Electronically Signed   By: Lajean Manes M.D.   On: 01/19/2018 12:17    Procedures Procedures (including critical care time)  Medications Ordered in UC Medications - No data to display   Initial Impression / Assessment and Plan / UC Course  I have reviewed the triage vital signs and the nursing notes.  Pertinent labs & imaging results that were available during my care of the patient were reviewed by me and considered in my medical decision making (see chart for details).    Reviewed x-ray results with patient and son. Discussed osteoarthritis but may also  have  rotator cuff involvement. Discussed further evaluation by MRI to determine ligament and tendon involvement. May use Voltaren gel - apply 4 times a day to shoulder area as needed. Since she has seen providers at [facility] in the past, recommend contact [facility]- Dr. Harlow Mares tomorrow for further evaluation.    Final Clinical Impressions(s) / UC Diagnoses   Final diagnoses:  Acute pain of right shoulder  Osteoarthritis of right shoulder, unspecified osteoarthritis type    ED Discharge Orders        Ordered    diclofenac sodium (VOLTAREN) 1 % GEL  4 times daily     01/19/18 1258       Controlled Substance Prescriptions [place] Controlled Substance Registry consulted? Not Applicable   Katy Apo, NP 01/19/18 1954

## 2018-01-19 NOTE — Discharge Instructions (Addendum)
Recommend use Voltaren gel - apply 4 times a day to shoulder area as needed. Recommend contact [facility]- Dr. Harlow Mares tomorrow for further evaluation.

## 2018-01-19 NOTE — ED Triage Notes (Signed)
Per patient fell on 12/12/2017. Patient was seen at [facility] ER for her fall. Per patient right arm was not hurting then, so no xray was taken. Patient now with right arm pain and would like an xray.

## 2018-01-24 DIAGNOSIS — M75101 Unspecified rotator cuff tear or rupture of right shoulder, not specified as traumatic: Secondary | ICD-10-CM | POA: Diagnosis not present

## 2018-01-27 ENCOUNTER — Other Ambulatory Visit: Payer: Self-pay | Admitting: Orthopedic Surgery

## 2018-01-27 DIAGNOSIS — M75101 Unspecified rotator cuff tear or rupture of right shoulder, not specified as traumatic: Secondary | ICD-10-CM

## 2018-01-30 ENCOUNTER — Telehealth: Payer: Self-pay

## 2018-01-30 NOTE — Telephone Encounter (Signed)
Needs a letter on letter head with patient info and patient home address stating she can not walk to pull her trash to the road due to disability. She had neighbor doing it but he broke his legs. Need to state medical issue and diagnosis and her name and address in header. Fax to [place] [phone]

## 2018-02-03 NOTE — Telephone Encounter (Signed)
Please Advise previous message and I will call patient with response.

## 2018-02-05 ENCOUNTER — Encounter: Payer: Self-pay | Admitting: Internal Medicine

## 2018-02-06 ENCOUNTER — Ambulatory Visit: Payer: Medicare Other

## 2018-02-06 ENCOUNTER — Telehealth: Payer: Self-pay

## 2018-02-06 NOTE — Telephone Encounter (Signed)
Called patient and left VM informed Dr Army Melia typed letter to the city for her trash to be picked up at her house and not the curb.

## 2018-02-12 DIAGNOSIS — M25511 Pain in right shoulder: Secondary | ICD-10-CM | POA: Diagnosis not present

## 2018-02-12 DIAGNOSIS — M6281 Muscle weakness (generalized): Secondary | ICD-10-CM | POA: Diagnosis not present

## 2018-02-17 DIAGNOSIS — M6281 Muscle weakness (generalized): Secondary | ICD-10-CM | POA: Diagnosis not present

## 2018-02-17 DIAGNOSIS — M25511 Pain in right shoulder: Secondary | ICD-10-CM | POA: Diagnosis not present

## 2018-02-25 DIAGNOSIS — Z96659 Presence of unspecified artificial knee joint: Secondary | ICD-10-CM | POA: Diagnosis not present

## 2018-02-26 DIAGNOSIS — M6281 Muscle weakness (generalized): Secondary | ICD-10-CM | POA: Diagnosis not present

## 2018-02-26 DIAGNOSIS — M25511 Pain in right shoulder: Secondary | ICD-10-CM | POA: Diagnosis not present

## 2018-02-28 DIAGNOSIS — M25511 Pain in right shoulder: Secondary | ICD-10-CM | POA: Diagnosis not present

## 2018-02-28 DIAGNOSIS — M6281 Muscle weakness (generalized): Secondary | ICD-10-CM | POA: Diagnosis not present

## 2018-03-10 DIAGNOSIS — M6281 Muscle weakness (generalized): Secondary | ICD-10-CM | POA: Diagnosis not present

## 2018-03-10 DIAGNOSIS — M25511 Pain in right shoulder: Secondary | ICD-10-CM | POA: Diagnosis not present

## 2018-03-18 DIAGNOSIS — M6281 Muscle weakness (generalized): Secondary | ICD-10-CM | POA: Diagnosis not present

## 2018-03-18 DIAGNOSIS — M25511 Pain in right shoulder: Secondary | ICD-10-CM | POA: Diagnosis not present

## 2018-04-15 ENCOUNTER — Encounter: Payer: Self-pay | Admitting: Internal Medicine

## 2018-04-15 ENCOUNTER — Ambulatory Visit (INDEPENDENT_AMBULATORY_CARE_PROVIDER_SITE_OTHER): Payer: Medicare Other | Admitting: Internal Medicine

## 2018-04-15 VITALS — BP 146/78 | HR 89 | Temp 98.8°F | Resp 16 | Ht 59.0 in | Wt 229.0 lb

## 2018-04-15 DIAGNOSIS — E785 Hyperlipidemia, unspecified: Secondary | ICD-10-CM | POA: Diagnosis not present

## 2018-04-15 DIAGNOSIS — M67911 Unspecified disorder of synovium and tendon, right shoulder: Secondary | ICD-10-CM | POA: Diagnosis not present

## 2018-04-15 DIAGNOSIS — I129 Hypertensive chronic kidney disease with stage 1 through stage 4 chronic kidney disease, or unspecified chronic kidney disease: Secondary | ICD-10-CM

## 2018-04-15 DIAGNOSIS — N184 Chronic kidney disease, stage 4 (severe): Secondary | ICD-10-CM | POA: Diagnosis not present

## 2018-04-15 DIAGNOSIS — I10 Essential (primary) hypertension: Secondary | ICD-10-CM

## 2018-04-15 DIAGNOSIS — E1122 Type 2 diabetes mellitus with diabetic chronic kidney disease: Secondary | ICD-10-CM

## 2018-04-15 DIAGNOSIS — E1169 Type 2 diabetes mellitus with other specified complication: Secondary | ICD-10-CM | POA: Diagnosis not present

## 2018-04-15 NOTE — Progress Notes (Signed)
Date:  04/15/2018   Name:  Heather Long   DOB:  November 09, 1928   MRN:  [id number]   Chief Complaint: Diabetes and Abnormal Lab (kidney functions ) Diabetes  She presents for her follow-up diabetic visit. She has type 2 diabetes mellitus. Her disease course has been stable. Hypoglycemia symptoms include dizziness. Pertinent negatives for hypoglycemia include no headaches, nervousness/anxiousness or tremors. Associated symptoms include fatigue. Pertinent negatives for diabetes include no chest pain and no weakness. Symptoms are stable. Diabetic complications include nephropathy (followed by Dr. Holley Raring). Current diabetic treatment includes insulin injections. She is currently taking insulin at bedtime. She is following a generally healthy diet. She monitors blood glucose at home 3-4 x per week. Her breakfast blood glucose is taken between 8-9 am. Her breakfast blood glucose range is generally 110-130 mg/dl. An ACE inhibitor/angiotensin II receptor blocker is being taken. Eye exam is current.  Hypertension  This is a chronic problem. The problem is controlled. Pertinent negatives include no chest pain, headaches, palpitations or shortness of breath. Past treatments include calcium channel blockers and angiotensin blockers. Compliance problems include medication side effects.   Hyperlipidemia  This is a chronic problem. Pertinent negatives include no chest pain or shortness of breath. Current antihyperlipidemic treatment includes statins. There are no compliance problems.   Shoulder Pain   The pain is present in the right shoulder. The current episode started more than 1 month ago. The problem has been gradually improving (with physical therapy for partial rotator cuff tear). Pertinent negatives include no fever.    Review of Systems  Constitutional: Positive for fatigue. Negative for chills and fever.  Eyes: Negative for visual disturbance.  Respiratory: Negative for chest tightness and shortness  of breath.   Cardiovascular: Positive for leg swelling. Negative for chest pain and palpitations.  Genitourinary: Negative for dysuria.  Musculoskeletal: Positive for arthralgias (right shoulder rotator cuff).  Neurological: Positive for dizziness and light-headedness. Negative for tremors, syncope, weakness and headaches.  Hematological: Negative for adenopathy.  Psychiatric/Behavioral: Negative for sleep disturbance. The patient is not nervous/anxious.     Patient Active Problem List   Diagnosis Date Noted  . Secondary hyperparathyroidism of renal origin ([place]) 10/16/2017  . Venous stasis dermatitis of left lower extremity 07/16/2017  . Chronic congestion of paranasal sinus 03/13/2017  . Degeneration macular 05/23/2016  . Osteitis deformans 05/23/2016  . Neoplasm of uncertain behavior of skin of face 05/23/2016  . Psoriasis 05/23/2016  . Vitamin B12 deficiency 05/16/2016  . Obesity, Class III, BMI 40-49.9 (morbid obesity) ([place]) 05/02/2016  . Type 2 DM with CKD stage 4 and hypertension ([place]) 05/02/2016  . Vitamin D deficiency 05/02/2016  . Lower extremity edema 05/02/2016  . Spinal stenosis of lumbar region 05/02/2016  . Essential (primary) hypertension 06/02/2012  . Hyperlipidemia associated with type 2 diabetes mellitus ([place]) 06/02/2012  . Malignant neoplasm of breast ([place]) 06/14/2011  . Generalized OA 06/14/2011  . Lymphedema of arm 06/14/2011  . OP (osteoporosis) 06/14/2011    Prior to Admission medications   Medication Sig Start Date End Date Taking? Authorizing Provider  ACCU-CHEK AVIVA PLUS test strip USE AS DIRECTED THREE TIMES DAILY 11/16/16   Glean Hess, MD  acetaminophen (TYLENOL) 325 MG tablet Take 650 mg by mouth every 6 (six) hours as needed.    [provider]  amLODipine (NORVASC) 2.5 MG tablet Take 2.5 mg by mouth daily.    [provider]  Ascorbic Acid (VITAMIN C) 1000 MG tablet Take 1,000 mg by  mouth daily.    [provider]    B-D UF III MINI PEN NEEDLES 31G X 5 MM MISC USE AS DIRECTED EVERY DAY 12/24/17   Glean Hess, MD  Cholecalciferol (VITAMIN D3) 5000 units CAPS Take by mouth. Daily    [provider]  diclofenac sodium (VOLTAREN) 1 % GEL Apply 4 g topically 4 (four) times daily. To affected area as needed 01/19/18   Katy Apo, NP  glucose blood test strip Please dispence test strips and lancets of patient's choice.  Check sugars three times daily 10/23/10   [provider]  LANTUS SOLOSTAR 100 UNIT/ML Solostar Pen ADMINISTER 34 UNITS UNDER THE SKIN DAILY AT 10 PM Patient taking differently: ADMINISTER 32 UNITS UNDER THE SKIN DAILY AT 10 PM 12/08/17   Glean Hess, MD  Liniments (ACE PAIN RELIEVING PATCH EX) Apply topically. As needed.    [provider]  losartan (COZAAR) 50 MG tablet Take 50 mg by mouth daily.    [provider]  Multiple Vitamins-Minerals (PRESERVISION AREDS PO) Take by mouth.    [provider]  traMADol (ULTRAM) 50 MG tablet Take 1 tablet (50 mg total) by mouth every 8 (eight) hours as needed. 12/05/17   Coral Spikes, DO  vitamin B-12 (CYANOCOBALAMIN) 1000 MCG tablet Take 1 tablet (1,000 mcg total) by mouth daily. 05/16/16   Plonk, Gwyndolyn Saxon, MD  Vitamin D, Ergocalciferol, (DRISDOL) 50000 units CAPS capsule TK 1 C PO 1 TIME A WK 08/21/17   [provider]    Allergies  Allergen Reactions  . Ciprofloxacin Nausea And Vomiting  . Gluten Meal Other (See Comments)    GI distress  . Pioglitazone Other (See Comments)    Leg swelling  . Gabapentin Diarrhea, Nausea Only and Other (See Comments)    dizziness    Past Surgical History:  Procedure Laterality Date  . ABDOMINAL HYSTERECTOMY    . JOINT REPLACEMENT  2000, 2006  . MASTECTOMY    . ORIF WRIST FRACTURE  07/2016    Social History   Tobacco Use  . Smoking status: Former Smoker    Packs/day: 0.25    Years: 2.00    Pack years: 0.50    Types: Cigarettes    Last  attempt to quit: 01/02/1952    Years since quitting: 66.3  . Smokeless tobacco: Never Used  . Tobacco comment: smoking cessation materials not required  Substance Use Topics  . Alcohol use: No  . Drug use: No     Medication list has been reviewed and updated.  Current Meds  Medication Sig  . ACCU-CHEK AVIVA PLUS test strip USE AS DIRECTED THREE TIMES DAILY  . acetaminophen (TYLENOL) 325 MG tablet Take 650 mg by mouth every 6 (six) hours as needed.  Marland Kitchen amLODipine (NORVASC) 2.5 MG tablet Take 2.5 mg by mouth daily.  . Ascorbic Acid (VITAMIN C) 1000 MG tablet Take 1,000 mg by mouth daily.  . B-D UF III MINI PEN NEEDLES 31G X 5 MM MISC USE AS DIRECTED EVERY DAY  . Cholecalciferol (VITAMIN D3) 5000 units CAPS Take by mouth. Daily  . diclofenac sodium (VOLTAREN) 1 % GEL Apply 4 g topically 4 (four) times daily. To affected area as needed  . glucose blood test strip Please dispence test strips and lancets of patient's choice.  Check sugars three times daily  . LANTUS SOLOSTAR 100 UNIT/ML Solostar Pen ADMINISTER 34 UNITS UNDER THE SKIN DAILY AT 10 PM (Patient taking differently: ADMINISTER 32  UNITS UNDER THE SKIN DAILY AT 10 PM)  . Liniments (ACE PAIN RELIEVING PATCH EX) Apply topically. As needed.  Marland Kitchen losartan (COZAAR) 50 MG tablet Take 50 mg by mouth daily.  . Multiple Vitamins-Minerals (PRESERVISION AREDS PO) Take by mouth.  . traMADol (ULTRAM) 50 MG tablet Take 1 tablet (50 mg total) by mouth every 8 (eight) hours as needed.  . vitamin B-12 (CYANOCOBALAMIN) 1000 MCG tablet Take 1 tablet (1,000 mcg total) by mouth daily.  . Vitamin D, Ergocalciferol, (DRISDOL) 50000 units CAPS capsule TK 1 C PO 1 TIME A WK    PHQ 2/9 Scores 04/15/2018 12/09/2017 11/15/2017 10/31/2017  PHQ - 2 Score 1 2 1  0  PHQ- 9 Score - 7 - -    Physical Exam  Constitutional: She is oriented to person, place, and time. She appears well-developed. No distress.  HENT:  Head: Normocephalic and atraumatic.  Neck: Normal  range of motion. Neck supple.  Cardiovascular: Normal rate, regular rhythm and normal heart sounds.  Pulmonary/Chest: Effort normal and breath sounds normal. No respiratory distress.  Musculoskeletal: She exhibits edema.  Neurological: She is alert and oriented to person, place, and time.  Skin: Skin is warm and dry. No rash noted.  Psychiatric: She has a normal mood and affect. Her behavior is normal. Thought content normal.  Nursing note and vitals reviewed.   BP (!) 146/78   Pulse 89   Temp 98.8 F (37.1 C) (Oral)   Resp 16   Ht 4\' 11"  (1.499 m)   Wt 229 lb (103.9 kg)   SpO2 97%   BMI 46.25 kg/m   Assessment and Plan: 1. Type 2 DM with CKD stage 4 and hypertension ([place]) Continue current insulin May need to add a small dose with supper  2. Hyperlipidemia associated with type 2 diabetes mellitus ([place]) Continue statin therapy Check labs  3. Essential (primary) hypertension Fair control - higher since pt held both meds for several days Discuss therapy next week with Nephrologist  4. Rotator cuff disorder, right Continue exercises Follow up with Ortho if needed

## 2018-04-16 LAB — COMPREHENSIVE METABOLIC PANEL
ALK PHOS: 61 IU/L (ref 39–117)
ALT: 12 IU/L (ref 0–32)
AST: 16 IU/L (ref 0–40)
Albumin/Globulin Ratio: 2 (ref 1.2–2.2)
Albumin: 4 g/dL (ref 3.5–4.7)
BUN/Creatinine Ratio: 23 (ref 12–28)
BUN: 27 mg/dL (ref 8–27)
Bilirubin Total: 0.3 mg/dL (ref 0.0–1.2)
CALCIUM: 9.4 mg/dL (ref 8.7–10.3)
CO2: 20 mmol/L (ref 20–29)
CREATININE: 1.2 mg/dL — AB (ref 0.57–1.00)
Chloride: 110 mmol/L — ABNORMAL HIGH (ref 96–106)
GFR calc Af Amer: 47 mL/min/{1.73_m2} — ABNORMAL LOW (ref >59)
GFR, EST NON AFRICAN AMERICAN: 40 mL/min/{1.73_m2} — AB (ref >59)
GLUCOSE: 218 mg/dL — AB (ref 65–99)
Globulin, Total: 2 g/dL (ref 1.5–4.5)
Potassium: 4.2 mmol/L (ref 3.5–5.2)
SODIUM: 145 mmol/L — AB (ref 134–144)
Total Protein: 6 g/dL (ref 6.0–8.5)

## 2018-04-16 LAB — LIPID PANEL
CHOL/HDL RATIO: 3.3 ratio (ref 0.0–4.4)
Cholesterol, Total: 178 mg/dL (ref 100–199)
HDL: 54 mg/dL (ref >39)
LDL CALC: 84 mg/dL (ref 0–99)
Triglycerides: 200 mg/dL — ABNORMAL HIGH (ref 0–149)
VLDL CHOLESTEROL CAL: 40 mg/dL (ref 5–40)

## 2018-04-16 LAB — HEMOGLOBIN A1C
Est. average glucose Bld gHb Est-mCnc: 146 mg/dL
Hgb A1c MFr Bld: 6.7 % — ABNORMAL HIGH (ref 4.8–5.6)

## 2018-04-16 LAB — TSH: TSH: 2.07 u[IU]/mL (ref 0.450–4.500)

## 2018-04-24 DIAGNOSIS — N2581 Secondary hyperparathyroidism of renal origin: Secondary | ICD-10-CM | POA: Diagnosis not present

## 2018-04-24 DIAGNOSIS — N183 Chronic kidney disease, stage 3 (moderate): Secondary | ICD-10-CM | POA: Diagnosis not present

## 2018-04-24 DIAGNOSIS — I10 Essential (primary) hypertension: Secondary | ICD-10-CM | POA: Diagnosis not present

## 2018-04-24 DIAGNOSIS — E1122 Type 2 diabetes mellitus with diabetic chronic kidney disease: Secondary | ICD-10-CM | POA: Diagnosis not present

## 2018-05-02 DIAGNOSIS — H353112 Nonexudative age-related macular degeneration, right eye, intermediate dry stage: Secondary | ICD-10-CM | POA: Diagnosis not present

## 2018-05-02 LAB — HM DIABETES EYE EXAM

## 2018-05-06 ENCOUNTER — Encounter: Payer: Self-pay | Admitting: Internal Medicine

## 2018-05-30 ENCOUNTER — Telehealth: Payer: Self-pay

## 2018-05-30 NOTE — Telephone Encounter (Signed)
[employer] called concern of patient LLE edema with possible ulcer. Patient needs to be seen prior to September appt. Please advise.

## 2018-05-30 NOTE — Telephone Encounter (Signed)
Did the nurse see her legs and ulcer?  Pt needs an appointment.

## 2018-05-30 NOTE — Telephone Encounter (Signed)
Patient notified requesting fluid pills for her edema. Please advise

## 2018-05-30 NOTE — Telephone Encounter (Signed)
Pt has appointment on Monday  °

## 2018-06-02 ENCOUNTER — Ambulatory Visit (INDEPENDENT_AMBULATORY_CARE_PROVIDER_SITE_OTHER): Payer: Medicare Other | Admitting: Internal Medicine

## 2018-06-02 ENCOUNTER — Encounter: Payer: Self-pay | Admitting: Internal Medicine

## 2018-06-02 VITALS — BP 138/72 | HR 77 | Ht 59.0 in | Wt 230.0 lb

## 2018-06-02 DIAGNOSIS — I872 Venous insufficiency (chronic) (peripheral): Secondary | ICD-10-CM

## 2018-06-02 DIAGNOSIS — I10 Essential (primary) hypertension: Secondary | ICD-10-CM | POA: Diagnosis not present

## 2018-06-02 DIAGNOSIS — R600 Localized edema: Secondary | ICD-10-CM

## 2018-06-02 NOTE — Progress Notes (Signed)
Date:  06/02/2018   Name:  Heather Long   DOB:  October 27, 1928   MRN:  [id number]   Chief Complaint: Rash (Started months ago- changing in color, and has more spots on leg since started. Had wellness visit from nurse and was told to see pcp. No fevers. Not painful or itchy. )  Pt has long standing lymphedema of both legs and left arm.  She had a nurse visit from insurance.  She had noticed that her left leg anteriorly had raised bumps over the site of previous stasis dermatitis. It has not been draining or red.  Maybe slightly more painful to touch.  She has chronic edema that does not change through the day.  Review of Systems  Constitutional: Negative for chills, fatigue and fever.  Cardiovascular: Positive for leg swelling. Negative for chest pain and palpitations.  Skin: Positive for rash. Negative for color change and wound.  Neurological: Negative for dizziness, light-headedness and headaches.  Hematological: Negative for adenopathy.  Psychiatric/Behavioral: Positive for dysphoric mood (she is currently sad over the recent loss of her cat). Negative for sleep disturbance. The patient is not nervous/anxious.     Patient Active Problem List   Diagnosis Date Noted  . Rotator cuff disorder, right 04/15/2018  . Secondary hyperparathyroidism of renal origin ([place]) 10/16/2017  . Venous stasis dermatitis of left lower extremity 07/16/2017  . Chronic congestion of paranasal sinus 03/13/2017  . Degeneration macular 05/23/2016  . Osteitis deformans 05/23/2016  . Neoplasm of uncertain behavior of skin of face 05/23/2016  . Psoriasis 05/23/2016  . Vitamin B12 deficiency 05/16/2016  . Obesity, Class III, BMI 40-49.9 (morbid obesity) ([place]) 05/02/2016  . Type 2 DM with CKD stage 4 and hypertension ([place]) 05/02/2016  . Vitamin D deficiency 05/02/2016  . Lower extremity edema 05/02/2016  . Spinal stenosis of lumbar region 05/02/2016  . Essential (primary) hypertension 06/02/2012  .  Hyperlipidemia associated with type 2 diabetes mellitus ([place]) 06/02/2012  . History of breast cancer 06/14/2011  . Generalized OA 06/14/2011  . Lymphedema of arm 06/14/2011  . OP (osteoporosis) 06/14/2011    Prior to Admission medications   Medication Sig Start Date End Date Taking? Authorizing Provider  ACCU-CHEK AVIVA PLUS test strip USE AS DIRECTED THREE TIMES DAILY 11/16/16  Yes Glean Hess, MD  acetaminophen (TYLENOL) 325 MG tablet Take 650 mg by mouth every 6 (six) hours as needed.   Yes [provider]  amLODipine (NORVASC) 2.5 MG tablet Take 2.5 mg by mouth daily.   Yes [provider]  Ascorbic Acid (VITAMIN C) 1000 MG tablet Take 1,000 mg by mouth daily.   Yes [provider]  B-D UF III MINI PEN NEEDLES 31G X 5 MM MISC USE AS DIRECTED EVERY DAY 12/24/17  Yes Glean Hess, MD  Cholecalciferol (VITAMIN D3) 5000 units CAPS Take by mouth. Daily   Yes [provider]  diclofenac sodium (VOLTAREN) 1 % GEL Apply 4 g topically 4 (four) times daily. To affected area as needed 01/19/18  Yes Amyot, Nicholes Stairs, NP  glucose blood test strip Please dispence test strips and lancets of patient's choice.  Check sugars three times daily 10/23/10  Yes [provider]  LANTUS SOLOSTAR 100 UNIT/ML Solostar Pen ADMINISTER 34 UNITS UNDER THE SKIN DAILY AT 10 PM Patient taking differently: ADMINISTER 32 UNITS UNDER THE SKIN DAILY AT 10 PM 12/08/17  Yes Glean Hess, MD  Liniments (ACE PAIN RELIEVING PATCH EX) Apply topically. As needed.  Yes [provider]  losartan (COZAAR) 50 MG tablet Take 50 mg by mouth daily.   Yes [provider]  Multiple Vitamins-Minerals (PRESERVISION AREDS PO) Take by mouth.   Yes [provider]  vitamin B-12 (CYANOCOBALAMIN) 1000 MCG tablet Take 1 tablet (1,000 mcg total) by mouth daily. 05/16/16  Yes Plonk, Gwyndolyn Saxon, MD  Vitamin D, Ergocalciferol, (DRISDOL) 50000 units CAPS capsule TK 1 C PO 1  TIME A WK 08/21/17  Yes [provider]    Allergies  Allergen Reactions  . Ciprofloxacin Nausea And Vomiting  . Gluten Meal Other (See Comments)    GI distress  . Pioglitazone Other (See Comments)    Leg swelling  . Gabapentin Diarrhea, Nausea Only and Other (See Comments)    dizziness    Past Surgical History:  Procedure Laterality Date  . ABDOMINAL HYSTERECTOMY    . JOINT REPLACEMENT  2000, 2006  . MASTECTOMY Left 2010  . ORIF WRIST FRACTURE  07/2016    Social History   Tobacco Use  . Smoking status: Former Smoker    Packs/day: 0.25    Years: 2.00    Pack years: 0.50    Types: Cigarettes    Last attempt to quit: 01/02/1952    Years since quitting: 66.4  . Smokeless tobacco: Never Used  . Tobacco comment: smoking cessation materials not required  Substance Use Topics  . Alcohol use: No  . Drug use: No     Medication list has been reviewed and updated.  Current Meds  Medication Sig  . ACCU-CHEK AVIVA PLUS test strip USE AS DIRECTED THREE TIMES DAILY  . acetaminophen (TYLENOL) 325 MG tablet Take 650 mg by mouth every 6 (six) hours as needed.  Marland Kitchen amLODipine (NORVASC) 2.5 MG tablet Take 2.5 mg by mouth daily.  . Ascorbic Acid (VITAMIN C) 1000 MG tablet Take 1,000 mg by mouth daily.  . B-D UF III MINI PEN NEEDLES 31G X 5 MM MISC USE AS DIRECTED EVERY DAY  . Cholecalciferol (VITAMIN D3) 5000 units CAPS Take by mouth. Daily  . diclofenac sodium (VOLTAREN) 1 % GEL Apply 4 g topically 4 (four) times daily. To affected area as needed  . glucose blood test strip Please dispence test strips and lancets of patient's choice.  Check sugars three times daily  . LANTUS SOLOSTAR 100 UNIT/ML Solostar Pen ADMINISTER 34 UNITS UNDER THE SKIN DAILY AT 10 PM (Patient taking differently: ADMINISTER 32 UNITS UNDER THE SKIN DAILY AT 10 PM)  . Liniments (ACE PAIN RELIEVING PATCH EX) Apply topically. As needed.  Marland Kitchen losartan (COZAAR) 50 MG tablet Take 50 mg by mouth daily.  . Multiple  Vitamins-Minerals (PRESERVISION AREDS PO) Take by mouth.  . vitamin B-12 (CYANOCOBALAMIN) 1000 MCG tablet Take 1 tablet (1,000 mcg total) by mouth daily.  . Vitamin D, Ergocalciferol, (DRISDOL) 50000 units CAPS capsule TK 1 C PO 1 TIME A WK    PHQ 2/9 Scores 04/15/2018 12/09/2017 11/15/2017 10/31/2017  PHQ - 2 Score 1 2 1  0  PHQ- 9 Score - 7 - -    Physical Exam  Constitutional: She is oriented to person, place, and time. She appears well-developed. No distress.  HENT:  Head: Normocephalic and atraumatic.  Cardiovascular: Normal rate, regular rhythm and normal heart sounds.  Pulmonary/Chest: Effort normal and breath sounds normal. No respiratory distress.  Musculoskeletal: Normal range of motion. She exhibits edema.  Neurological: She is alert and oriented to person, place, and time.  Skin: Skin is warm and  dry. No rash noted.     Psychiatric: She has a normal mood and affect. Her behavior is normal. Thought content normal.  Nursing note and vitals reviewed.   BP 138/72   Pulse 77   Ht 4\' 11"  (1.499 m)   Wt 230 lb (104.3 kg)   SpO2 95%   BMI 46.45 kg/m   Assessment and Plan: 1. Lower extremity edema Continue to elevate Not a candidate for diuretics  2. Venous stasis dermatitis of left lower extremity Hx of dermatitis/infection - need close follow up  Will do [facility] referral  3. Essential (primary) hypertension Controlled Followed by Nephrology   No orders of the defined types were placed in this encounter.   Partially dictated using Editor, commissioning. Any errors are unintentional.  Halina Maidens, MD [facility]  06/02/2018

## 2018-06-07 DIAGNOSIS — N184 Chronic kidney disease, stage 4 (severe): Secondary | ICD-10-CM | POA: Diagnosis not present

## 2018-06-07 DIAGNOSIS — M48061 Spinal stenosis, lumbar region without neurogenic claudication: Secondary | ICD-10-CM | POA: Diagnosis not present

## 2018-06-07 DIAGNOSIS — Z853 Personal history of malignant neoplasm of breast: Secondary | ICD-10-CM | POA: Diagnosis not present

## 2018-06-07 DIAGNOSIS — M75101 Unspecified rotator cuff tear or rupture of right shoulder, not specified as traumatic: Secondary | ICD-10-CM | POA: Diagnosis not present

## 2018-06-07 DIAGNOSIS — I890 Lymphedema, not elsewhere classified: Secondary | ICD-10-CM | POA: Diagnosis not present

## 2018-06-07 DIAGNOSIS — Z9181 History of falling: Secondary | ICD-10-CM | POA: Diagnosis not present

## 2018-06-07 DIAGNOSIS — M810 Age-related osteoporosis without current pathological fracture: Secondary | ICD-10-CM | POA: Diagnosis not present

## 2018-06-07 DIAGNOSIS — E559 Vitamin D deficiency, unspecified: Secondary | ICD-10-CM | POA: Diagnosis not present

## 2018-06-07 DIAGNOSIS — I129 Hypertensive chronic kidney disease with stage 1 through stage 4 chronic kidney disease, or unspecified chronic kidney disease: Secondary | ICD-10-CM | POA: Diagnosis not present

## 2018-06-07 DIAGNOSIS — E785 Hyperlipidemia, unspecified: Secondary | ICD-10-CM | POA: Diagnosis not present

## 2018-06-07 DIAGNOSIS — N2581 Secondary hyperparathyroidism of renal origin: Secondary | ICD-10-CM | POA: Diagnosis not present

## 2018-06-07 DIAGNOSIS — Z87891 Personal history of nicotine dependence: Secondary | ICD-10-CM | POA: Diagnosis not present

## 2018-06-07 DIAGNOSIS — E1122 Type 2 diabetes mellitus with diabetic chronic kidney disease: Secondary | ICD-10-CM | POA: Diagnosis not present

## 2018-06-07 DIAGNOSIS — Z794 Long term (current) use of insulin: Secondary | ICD-10-CM | POA: Diagnosis not present

## 2018-06-07 DIAGNOSIS — M1990 Unspecified osteoarthritis, unspecified site: Secondary | ICD-10-CM | POA: Diagnosis not present

## 2018-06-07 DIAGNOSIS — I872 Venous insufficiency (chronic) (peripheral): Secondary | ICD-10-CM | POA: Diagnosis not present

## 2018-06-07 DIAGNOSIS — E538 Deficiency of other specified B group vitamins: Secondary | ICD-10-CM | POA: Diagnosis not present

## 2018-06-09 ENCOUNTER — Telehealth: Payer: Self-pay

## 2018-06-09 NOTE — Telephone Encounter (Signed)
Heather Long From [facility] called about patient. Stated she is requesting verbal orders for skilled nursing to go out to patients home and monitor lymphedema. Also, requesting verbal orders for OT for mobility issues.  She wanted to know if we would prescribe a lymphedema sleeve and pump for the patient for long term use, along with triamcinolone cream.  Spoke with Dr Army Melia- Called [name] back and informed her we can give verbal orders for OT and Skilled nursing but Dr Army Melia doesn't see the need in prescribing any cream at this time. Informed her Dr Army Melia also does not prescribe sleeves or pumps ( they typically come from wound clinic ) and informed her since Dr Annamary Carolin just saw the patient a few days ago she had no wound. Told her to continue to monitor the patients legs, and if it becomes a wound then to contact our office back and we can put in a referral for wound clinic.  Heather Long verbalized understanding of this.

## 2018-06-11 DIAGNOSIS — I890 Lymphedema, not elsewhere classified: Secondary | ICD-10-CM | POA: Diagnosis not present

## 2018-06-11 DIAGNOSIS — I872 Venous insufficiency (chronic) (peripheral): Secondary | ICD-10-CM | POA: Diagnosis not present

## 2018-06-11 DIAGNOSIS — I129 Hypertensive chronic kidney disease with stage 1 through stage 4 chronic kidney disease, or unspecified chronic kidney disease: Secondary | ICD-10-CM | POA: Diagnosis not present

## 2018-06-11 DIAGNOSIS — E785 Hyperlipidemia, unspecified: Secondary | ICD-10-CM | POA: Diagnosis not present

## 2018-06-11 DIAGNOSIS — N184 Chronic kidney disease, stage 4 (severe): Secondary | ICD-10-CM | POA: Diagnosis not present

## 2018-06-11 DIAGNOSIS — Z853 Personal history of malignant neoplasm of breast: Secondary | ICD-10-CM | POA: Diagnosis not present

## 2018-06-11 DIAGNOSIS — N2581 Secondary hyperparathyroidism of renal origin: Secondary | ICD-10-CM | POA: Diagnosis not present

## 2018-06-11 DIAGNOSIS — M75101 Unspecified rotator cuff tear or rupture of right shoulder, not specified as traumatic: Secondary | ICD-10-CM | POA: Diagnosis not present

## 2018-06-11 DIAGNOSIS — E538 Deficiency of other specified B group vitamins: Secondary | ICD-10-CM | POA: Diagnosis not present

## 2018-06-11 DIAGNOSIS — Z87891 Personal history of nicotine dependence: Secondary | ICD-10-CM | POA: Diagnosis not present

## 2018-06-11 DIAGNOSIS — Z794 Long term (current) use of insulin: Secondary | ICD-10-CM | POA: Diagnosis not present

## 2018-06-11 DIAGNOSIS — M48061 Spinal stenosis, lumbar region without neurogenic claudication: Secondary | ICD-10-CM | POA: Diagnosis not present

## 2018-06-11 DIAGNOSIS — E1122 Type 2 diabetes mellitus with diabetic chronic kidney disease: Secondary | ICD-10-CM | POA: Diagnosis not present

## 2018-06-11 DIAGNOSIS — M810 Age-related osteoporosis without current pathological fracture: Secondary | ICD-10-CM | POA: Diagnosis not present

## 2018-06-11 DIAGNOSIS — Z9181 History of falling: Secondary | ICD-10-CM | POA: Diagnosis not present

## 2018-06-11 DIAGNOSIS — M1990 Unspecified osteoarthritis, unspecified site: Secondary | ICD-10-CM | POA: Diagnosis not present

## 2018-06-11 DIAGNOSIS — E559 Vitamin D deficiency, unspecified: Secondary | ICD-10-CM | POA: Diagnosis not present

## 2018-06-14 DIAGNOSIS — M1990 Unspecified osteoarthritis, unspecified site: Secondary | ICD-10-CM | POA: Diagnosis not present

## 2018-06-14 DIAGNOSIS — I872 Venous insufficiency (chronic) (peripheral): Secondary | ICD-10-CM | POA: Diagnosis not present

## 2018-06-14 DIAGNOSIS — M75101 Unspecified rotator cuff tear or rupture of right shoulder, not specified as traumatic: Secondary | ICD-10-CM | POA: Diagnosis not present

## 2018-06-14 DIAGNOSIS — I890 Lymphedema, not elsewhere classified: Secondary | ICD-10-CM | POA: Diagnosis not present

## 2018-06-14 DIAGNOSIS — N2581 Secondary hyperparathyroidism of renal origin: Secondary | ICD-10-CM | POA: Diagnosis not present

## 2018-06-14 DIAGNOSIS — M810 Age-related osteoporosis without current pathological fracture: Secondary | ICD-10-CM | POA: Diagnosis not present

## 2018-06-14 DIAGNOSIS — I129 Hypertensive chronic kidney disease with stage 1 through stage 4 chronic kidney disease, or unspecified chronic kidney disease: Secondary | ICD-10-CM | POA: Diagnosis not present

## 2018-06-14 DIAGNOSIS — E785 Hyperlipidemia, unspecified: Secondary | ICD-10-CM | POA: Diagnosis not present

## 2018-06-14 DIAGNOSIS — E1122 Type 2 diabetes mellitus with diabetic chronic kidney disease: Secondary | ICD-10-CM | POA: Diagnosis not present

## 2018-06-14 DIAGNOSIS — M48061 Spinal stenosis, lumbar region without neurogenic claudication: Secondary | ICD-10-CM | POA: Diagnosis not present

## 2018-06-14 DIAGNOSIS — E538 Deficiency of other specified B group vitamins: Secondary | ICD-10-CM | POA: Diagnosis not present

## 2018-06-14 DIAGNOSIS — N184 Chronic kidney disease, stage 4 (severe): Secondary | ICD-10-CM | POA: Diagnosis not present

## 2018-06-16 DIAGNOSIS — E1122 Type 2 diabetes mellitus with diabetic chronic kidney disease: Secondary | ICD-10-CM | POA: Diagnosis not present

## 2018-06-16 DIAGNOSIS — M48061 Spinal stenosis, lumbar region without neurogenic claudication: Secondary | ICD-10-CM | POA: Diagnosis not present

## 2018-06-16 DIAGNOSIS — M75101 Unspecified rotator cuff tear or rupture of right shoulder, not specified as traumatic: Secondary | ICD-10-CM | POA: Diagnosis not present

## 2018-06-16 DIAGNOSIS — I890 Lymphedema, not elsewhere classified: Secondary | ICD-10-CM | POA: Diagnosis not present

## 2018-06-16 DIAGNOSIS — E785 Hyperlipidemia, unspecified: Secondary | ICD-10-CM | POA: Diagnosis not present

## 2018-06-16 DIAGNOSIS — M810 Age-related osteoporosis without current pathological fracture: Secondary | ICD-10-CM | POA: Diagnosis not present

## 2018-06-16 DIAGNOSIS — E538 Deficiency of other specified B group vitamins: Secondary | ICD-10-CM | POA: Diagnosis not present

## 2018-06-16 DIAGNOSIS — M1990 Unspecified osteoarthritis, unspecified site: Secondary | ICD-10-CM | POA: Diagnosis not present

## 2018-06-16 DIAGNOSIS — I129 Hypertensive chronic kidney disease with stage 1 through stage 4 chronic kidney disease, or unspecified chronic kidney disease: Secondary | ICD-10-CM | POA: Diagnosis not present

## 2018-06-16 DIAGNOSIS — I872 Venous insufficiency (chronic) (peripheral): Secondary | ICD-10-CM | POA: Diagnosis not present

## 2018-06-16 DIAGNOSIS — N2581 Secondary hyperparathyroidism of renal origin: Secondary | ICD-10-CM | POA: Diagnosis not present

## 2018-06-16 DIAGNOSIS — N184 Chronic kidney disease, stage 4 (severe): Secondary | ICD-10-CM | POA: Diagnosis not present

## 2018-06-17 ENCOUNTER — Telehealth: Payer: Self-pay

## 2018-06-17 NOTE — Telephone Encounter (Signed)
Needs Verbal to do OT- Twice week x 3 weeks OT L side upper extremity weakness......Marland Kitchenplease call with verbal.

## 2018-06-17 NOTE — Telephone Encounter (Signed)
What's the return callback number?  Thank you!

## 2018-06-18 NOTE — Telephone Encounter (Signed)
I called and gave verbal

## 2018-06-19 DIAGNOSIS — I890 Lymphedema, not elsewhere classified: Secondary | ICD-10-CM | POA: Diagnosis not present

## 2018-06-19 DIAGNOSIS — Z794 Long term (current) use of insulin: Secondary | ICD-10-CM | POA: Diagnosis not present

## 2018-06-19 DIAGNOSIS — Z853 Personal history of malignant neoplasm of breast: Secondary | ICD-10-CM | POA: Diagnosis not present

## 2018-06-19 DIAGNOSIS — E785 Hyperlipidemia, unspecified: Secondary | ICD-10-CM | POA: Diagnosis not present

## 2018-06-19 DIAGNOSIS — M75101 Unspecified rotator cuff tear or rupture of right shoulder, not specified as traumatic: Secondary | ICD-10-CM | POA: Diagnosis not present

## 2018-06-19 DIAGNOSIS — N184 Chronic kidney disease, stage 4 (severe): Secondary | ICD-10-CM | POA: Diagnosis not present

## 2018-06-19 DIAGNOSIS — I129 Hypertensive chronic kidney disease with stage 1 through stage 4 chronic kidney disease, or unspecified chronic kidney disease: Secondary | ICD-10-CM | POA: Diagnosis not present

## 2018-06-19 DIAGNOSIS — E538 Deficiency of other specified B group vitamins: Secondary | ICD-10-CM | POA: Diagnosis not present

## 2018-06-19 DIAGNOSIS — M48061 Spinal stenosis, lumbar region without neurogenic claudication: Secondary | ICD-10-CM | POA: Diagnosis not present

## 2018-06-19 DIAGNOSIS — N2581 Secondary hyperparathyroidism of renal origin: Secondary | ICD-10-CM | POA: Diagnosis not present

## 2018-06-19 DIAGNOSIS — Z87891 Personal history of nicotine dependence: Secondary | ICD-10-CM | POA: Diagnosis not present

## 2018-06-19 DIAGNOSIS — E1122 Type 2 diabetes mellitus with diabetic chronic kidney disease: Secondary | ICD-10-CM | POA: Diagnosis not present

## 2018-06-19 DIAGNOSIS — M1990 Unspecified osteoarthritis, unspecified site: Secondary | ICD-10-CM | POA: Diagnosis not present

## 2018-06-19 DIAGNOSIS — M810 Age-related osteoporosis without current pathological fracture: Secondary | ICD-10-CM | POA: Diagnosis not present

## 2018-06-19 DIAGNOSIS — I872 Venous insufficiency (chronic) (peripheral): Secondary | ICD-10-CM | POA: Diagnosis not present

## 2018-06-19 DIAGNOSIS — Z9181 History of falling: Secondary | ICD-10-CM | POA: Diagnosis not present

## 2018-06-19 DIAGNOSIS — E559 Vitamin D deficiency, unspecified: Secondary | ICD-10-CM | POA: Diagnosis not present

## 2018-06-21 DIAGNOSIS — M75101 Unspecified rotator cuff tear or rupture of right shoulder, not specified as traumatic: Secondary | ICD-10-CM | POA: Diagnosis not present

## 2018-06-21 DIAGNOSIS — N2581 Secondary hyperparathyroidism of renal origin: Secondary | ICD-10-CM | POA: Diagnosis not present

## 2018-06-21 DIAGNOSIS — E785 Hyperlipidemia, unspecified: Secondary | ICD-10-CM | POA: Diagnosis not present

## 2018-06-21 DIAGNOSIS — N184 Chronic kidney disease, stage 4 (severe): Secondary | ICD-10-CM | POA: Diagnosis not present

## 2018-06-21 DIAGNOSIS — M1990 Unspecified osteoarthritis, unspecified site: Secondary | ICD-10-CM | POA: Diagnosis not present

## 2018-06-21 DIAGNOSIS — I872 Venous insufficiency (chronic) (peripheral): Secondary | ICD-10-CM | POA: Diagnosis not present

## 2018-06-21 DIAGNOSIS — M48061 Spinal stenosis, lumbar region without neurogenic claudication: Secondary | ICD-10-CM | POA: Diagnosis not present

## 2018-06-21 DIAGNOSIS — I129 Hypertensive chronic kidney disease with stage 1 through stage 4 chronic kidney disease, or unspecified chronic kidney disease: Secondary | ICD-10-CM | POA: Diagnosis not present

## 2018-06-21 DIAGNOSIS — I890 Lymphedema, not elsewhere classified: Secondary | ICD-10-CM | POA: Diagnosis not present

## 2018-06-21 DIAGNOSIS — E1122 Type 2 diabetes mellitus with diabetic chronic kidney disease: Secondary | ICD-10-CM | POA: Diagnosis not present

## 2018-06-21 DIAGNOSIS — E538 Deficiency of other specified B group vitamins: Secondary | ICD-10-CM | POA: Diagnosis not present

## 2018-06-21 DIAGNOSIS — M810 Age-related osteoporosis without current pathological fracture: Secondary | ICD-10-CM | POA: Diagnosis not present

## 2018-06-23 DIAGNOSIS — E785 Hyperlipidemia, unspecified: Secondary | ICD-10-CM | POA: Diagnosis not present

## 2018-06-23 DIAGNOSIS — E1122 Type 2 diabetes mellitus with diabetic chronic kidney disease: Secondary | ICD-10-CM | POA: Diagnosis not present

## 2018-06-23 DIAGNOSIS — M75101 Unspecified rotator cuff tear or rupture of right shoulder, not specified as traumatic: Secondary | ICD-10-CM | POA: Diagnosis not present

## 2018-06-23 DIAGNOSIS — I890 Lymphedema, not elsewhere classified: Secondary | ICD-10-CM | POA: Diagnosis not present

## 2018-06-23 DIAGNOSIS — N2581 Secondary hyperparathyroidism of renal origin: Secondary | ICD-10-CM | POA: Diagnosis not present

## 2018-06-23 DIAGNOSIS — M810 Age-related osteoporosis without current pathological fracture: Secondary | ICD-10-CM | POA: Diagnosis not present

## 2018-06-23 DIAGNOSIS — E538 Deficiency of other specified B group vitamins: Secondary | ICD-10-CM | POA: Diagnosis not present

## 2018-06-23 DIAGNOSIS — M1990 Unspecified osteoarthritis, unspecified site: Secondary | ICD-10-CM | POA: Diagnosis not present

## 2018-06-23 DIAGNOSIS — I872 Venous insufficiency (chronic) (peripheral): Secondary | ICD-10-CM | POA: Diagnosis not present

## 2018-06-23 DIAGNOSIS — I129 Hypertensive chronic kidney disease with stage 1 through stage 4 chronic kidney disease, or unspecified chronic kidney disease: Secondary | ICD-10-CM | POA: Diagnosis not present

## 2018-06-23 DIAGNOSIS — M48061 Spinal stenosis, lumbar region without neurogenic claudication: Secondary | ICD-10-CM | POA: Diagnosis not present

## 2018-06-23 DIAGNOSIS — N184 Chronic kidney disease, stage 4 (severe): Secondary | ICD-10-CM | POA: Diagnosis not present

## 2018-06-25 DIAGNOSIS — E538 Deficiency of other specified B group vitamins: Secondary | ICD-10-CM | POA: Diagnosis not present

## 2018-06-25 DIAGNOSIS — M1990 Unspecified osteoarthritis, unspecified site: Secondary | ICD-10-CM | POA: Diagnosis not present

## 2018-06-25 DIAGNOSIS — E1122 Type 2 diabetes mellitus with diabetic chronic kidney disease: Secondary | ICD-10-CM | POA: Diagnosis not present

## 2018-06-25 DIAGNOSIS — M810 Age-related osteoporosis without current pathological fracture: Secondary | ICD-10-CM | POA: Diagnosis not present

## 2018-06-25 DIAGNOSIS — N184 Chronic kidney disease, stage 4 (severe): Secondary | ICD-10-CM | POA: Diagnosis not present

## 2018-06-25 DIAGNOSIS — E785 Hyperlipidemia, unspecified: Secondary | ICD-10-CM | POA: Diagnosis not present

## 2018-06-25 DIAGNOSIS — I872 Venous insufficiency (chronic) (peripheral): Secondary | ICD-10-CM | POA: Diagnosis not present

## 2018-06-25 DIAGNOSIS — M48061 Spinal stenosis, lumbar region without neurogenic claudication: Secondary | ICD-10-CM | POA: Diagnosis not present

## 2018-06-25 DIAGNOSIS — I890 Lymphedema, not elsewhere classified: Secondary | ICD-10-CM | POA: Diagnosis not present

## 2018-06-25 DIAGNOSIS — M75101 Unspecified rotator cuff tear or rupture of right shoulder, not specified as traumatic: Secondary | ICD-10-CM | POA: Diagnosis not present

## 2018-06-25 DIAGNOSIS — N2581 Secondary hyperparathyroidism of renal origin: Secondary | ICD-10-CM | POA: Diagnosis not present

## 2018-06-25 DIAGNOSIS — I129 Hypertensive chronic kidney disease with stage 1 through stage 4 chronic kidney disease, or unspecified chronic kidney disease: Secondary | ICD-10-CM | POA: Diagnosis not present

## 2018-06-28 DIAGNOSIS — M75101 Unspecified rotator cuff tear or rupture of right shoulder, not specified as traumatic: Secondary | ICD-10-CM | POA: Diagnosis not present

## 2018-06-28 DIAGNOSIS — I872 Venous insufficiency (chronic) (peripheral): Secondary | ICD-10-CM | POA: Diagnosis not present

## 2018-06-28 DIAGNOSIS — N184 Chronic kidney disease, stage 4 (severe): Secondary | ICD-10-CM | POA: Diagnosis not present

## 2018-06-28 DIAGNOSIS — M1990 Unspecified osteoarthritis, unspecified site: Secondary | ICD-10-CM | POA: Diagnosis not present

## 2018-06-28 DIAGNOSIS — I129 Hypertensive chronic kidney disease with stage 1 through stage 4 chronic kidney disease, or unspecified chronic kidney disease: Secondary | ICD-10-CM | POA: Diagnosis not present

## 2018-06-28 DIAGNOSIS — E1122 Type 2 diabetes mellitus with diabetic chronic kidney disease: Secondary | ICD-10-CM | POA: Diagnosis not present

## 2018-06-28 DIAGNOSIS — M48061 Spinal stenosis, lumbar region without neurogenic claudication: Secondary | ICD-10-CM | POA: Diagnosis not present

## 2018-06-28 DIAGNOSIS — E785 Hyperlipidemia, unspecified: Secondary | ICD-10-CM | POA: Diagnosis not present

## 2018-06-28 DIAGNOSIS — I890 Lymphedema, not elsewhere classified: Secondary | ICD-10-CM | POA: Diagnosis not present

## 2018-06-28 DIAGNOSIS — N2581 Secondary hyperparathyroidism of renal origin: Secondary | ICD-10-CM | POA: Diagnosis not present

## 2018-06-28 DIAGNOSIS — E538 Deficiency of other specified B group vitamins: Secondary | ICD-10-CM | POA: Diagnosis not present

## 2018-06-28 DIAGNOSIS — M810 Age-related osteoporosis without current pathological fracture: Secondary | ICD-10-CM | POA: Diagnosis not present

## 2018-06-30 DIAGNOSIS — I890 Lymphedema, not elsewhere classified: Secondary | ICD-10-CM | POA: Diagnosis not present

## 2018-06-30 DIAGNOSIS — M1990 Unspecified osteoarthritis, unspecified site: Secondary | ICD-10-CM | POA: Diagnosis not present

## 2018-06-30 DIAGNOSIS — M48061 Spinal stenosis, lumbar region without neurogenic claudication: Secondary | ICD-10-CM | POA: Diagnosis not present

## 2018-06-30 DIAGNOSIS — E538 Deficiency of other specified B group vitamins: Secondary | ICD-10-CM | POA: Diagnosis not present

## 2018-06-30 DIAGNOSIS — I872 Venous insufficiency (chronic) (peripheral): Secondary | ICD-10-CM | POA: Diagnosis not present

## 2018-06-30 DIAGNOSIS — M810 Age-related osteoporosis without current pathological fracture: Secondary | ICD-10-CM | POA: Diagnosis not present

## 2018-06-30 DIAGNOSIS — E785 Hyperlipidemia, unspecified: Secondary | ICD-10-CM | POA: Diagnosis not present

## 2018-06-30 DIAGNOSIS — N2581 Secondary hyperparathyroidism of renal origin: Secondary | ICD-10-CM | POA: Diagnosis not present

## 2018-06-30 DIAGNOSIS — M75101 Unspecified rotator cuff tear or rupture of right shoulder, not specified as traumatic: Secondary | ICD-10-CM | POA: Diagnosis not present

## 2018-06-30 DIAGNOSIS — I129 Hypertensive chronic kidney disease with stage 1 through stage 4 chronic kidney disease, or unspecified chronic kidney disease: Secondary | ICD-10-CM | POA: Diagnosis not present

## 2018-06-30 DIAGNOSIS — N184 Chronic kidney disease, stage 4 (severe): Secondary | ICD-10-CM | POA: Diagnosis not present

## 2018-06-30 DIAGNOSIS — E1122 Type 2 diabetes mellitus with diabetic chronic kidney disease: Secondary | ICD-10-CM | POA: Diagnosis not present

## 2018-07-02 DIAGNOSIS — H26491 Other secondary cataract, right eye: Secondary | ICD-10-CM | POA: Diagnosis not present

## 2018-07-03 DIAGNOSIS — I890 Lymphedema, not elsewhere classified: Secondary | ICD-10-CM | POA: Diagnosis not present

## 2018-07-03 DIAGNOSIS — M48061 Spinal stenosis, lumbar region without neurogenic claudication: Secondary | ICD-10-CM | POA: Diagnosis not present

## 2018-07-03 DIAGNOSIS — I129 Hypertensive chronic kidney disease with stage 1 through stage 4 chronic kidney disease, or unspecified chronic kidney disease: Secondary | ICD-10-CM | POA: Diagnosis not present

## 2018-07-03 DIAGNOSIS — M810 Age-related osteoporosis without current pathological fracture: Secondary | ICD-10-CM | POA: Diagnosis not present

## 2018-07-03 DIAGNOSIS — M75101 Unspecified rotator cuff tear or rupture of right shoulder, not specified as traumatic: Secondary | ICD-10-CM | POA: Diagnosis not present

## 2018-07-03 DIAGNOSIS — E538 Deficiency of other specified B group vitamins: Secondary | ICD-10-CM | POA: Diagnosis not present

## 2018-07-03 DIAGNOSIS — N2581 Secondary hyperparathyroidism of renal origin: Secondary | ICD-10-CM | POA: Diagnosis not present

## 2018-07-03 DIAGNOSIS — E785 Hyperlipidemia, unspecified: Secondary | ICD-10-CM | POA: Diagnosis not present

## 2018-07-03 DIAGNOSIS — E1122 Type 2 diabetes mellitus with diabetic chronic kidney disease: Secondary | ICD-10-CM | POA: Diagnosis not present

## 2018-07-03 DIAGNOSIS — M1990 Unspecified osteoarthritis, unspecified site: Secondary | ICD-10-CM | POA: Diagnosis not present

## 2018-07-03 DIAGNOSIS — N184 Chronic kidney disease, stage 4 (severe): Secondary | ICD-10-CM | POA: Diagnosis not present

## 2018-07-03 DIAGNOSIS — I872 Venous insufficiency (chronic) (peripheral): Secondary | ICD-10-CM | POA: Diagnosis not present

## 2018-07-04 DIAGNOSIS — M48061 Spinal stenosis, lumbar region without neurogenic claudication: Secondary | ICD-10-CM | POA: Diagnosis not present

## 2018-07-04 DIAGNOSIS — I890 Lymphedema, not elsewhere classified: Secondary | ICD-10-CM | POA: Diagnosis not present

## 2018-07-04 DIAGNOSIS — I872 Venous insufficiency (chronic) (peripheral): Secondary | ICD-10-CM | POA: Diagnosis not present

## 2018-07-04 DIAGNOSIS — I129 Hypertensive chronic kidney disease with stage 1 through stage 4 chronic kidney disease, or unspecified chronic kidney disease: Secondary | ICD-10-CM | POA: Diagnosis not present

## 2018-07-04 DIAGNOSIS — M75101 Unspecified rotator cuff tear or rupture of right shoulder, not specified as traumatic: Secondary | ICD-10-CM | POA: Diagnosis not present

## 2018-07-04 DIAGNOSIS — N184 Chronic kidney disease, stage 4 (severe): Secondary | ICD-10-CM | POA: Diagnosis not present

## 2018-07-04 DIAGNOSIS — E538 Deficiency of other specified B group vitamins: Secondary | ICD-10-CM | POA: Diagnosis not present

## 2018-07-04 DIAGNOSIS — M810 Age-related osteoporosis without current pathological fracture: Secondary | ICD-10-CM | POA: Diagnosis not present

## 2018-07-04 DIAGNOSIS — E1122 Type 2 diabetes mellitus with diabetic chronic kidney disease: Secondary | ICD-10-CM | POA: Diagnosis not present

## 2018-07-04 DIAGNOSIS — M1990 Unspecified osteoarthritis, unspecified site: Secondary | ICD-10-CM | POA: Diagnosis not present

## 2018-07-04 DIAGNOSIS — N2581 Secondary hyperparathyroidism of renal origin: Secondary | ICD-10-CM | POA: Diagnosis not present

## 2018-07-04 DIAGNOSIS — E785 Hyperlipidemia, unspecified: Secondary | ICD-10-CM | POA: Diagnosis not present

## 2018-07-07 DIAGNOSIS — M1990 Unspecified osteoarthritis, unspecified site: Secondary | ICD-10-CM | POA: Diagnosis not present

## 2018-07-07 DIAGNOSIS — E538 Deficiency of other specified B group vitamins: Secondary | ICD-10-CM | POA: Diagnosis not present

## 2018-07-07 DIAGNOSIS — I872 Venous insufficiency (chronic) (peripheral): Secondary | ICD-10-CM | POA: Diagnosis not present

## 2018-07-07 DIAGNOSIS — M48061 Spinal stenosis, lumbar region without neurogenic claudication: Secondary | ICD-10-CM | POA: Diagnosis not present

## 2018-07-07 DIAGNOSIS — N2581 Secondary hyperparathyroidism of renal origin: Secondary | ICD-10-CM | POA: Diagnosis not present

## 2018-07-07 DIAGNOSIS — E1122 Type 2 diabetes mellitus with diabetic chronic kidney disease: Secondary | ICD-10-CM | POA: Diagnosis not present

## 2018-07-07 DIAGNOSIS — N184 Chronic kidney disease, stage 4 (severe): Secondary | ICD-10-CM | POA: Diagnosis not present

## 2018-07-07 DIAGNOSIS — M75101 Unspecified rotator cuff tear or rupture of right shoulder, not specified as traumatic: Secondary | ICD-10-CM | POA: Diagnosis not present

## 2018-07-07 DIAGNOSIS — M810 Age-related osteoporosis without current pathological fracture: Secondary | ICD-10-CM | POA: Diagnosis not present

## 2018-07-07 DIAGNOSIS — I129 Hypertensive chronic kidney disease with stage 1 through stage 4 chronic kidney disease, or unspecified chronic kidney disease: Secondary | ICD-10-CM | POA: Diagnosis not present

## 2018-07-07 DIAGNOSIS — I890 Lymphedema, not elsewhere classified: Secondary | ICD-10-CM | POA: Diagnosis not present

## 2018-07-07 DIAGNOSIS — E785 Hyperlipidemia, unspecified: Secondary | ICD-10-CM | POA: Diagnosis not present

## 2018-07-08 DIAGNOSIS — I890 Lymphedema, not elsewhere classified: Secondary | ICD-10-CM | POA: Diagnosis not present

## 2018-07-08 DIAGNOSIS — M48061 Spinal stenosis, lumbar region without neurogenic claudication: Secondary | ICD-10-CM | POA: Diagnosis not present

## 2018-07-08 DIAGNOSIS — M1990 Unspecified osteoarthritis, unspecified site: Secondary | ICD-10-CM | POA: Diagnosis not present

## 2018-07-08 DIAGNOSIS — E538 Deficiency of other specified B group vitamins: Secondary | ICD-10-CM | POA: Diagnosis not present

## 2018-07-08 DIAGNOSIS — I129 Hypertensive chronic kidney disease with stage 1 through stage 4 chronic kidney disease, or unspecified chronic kidney disease: Secondary | ICD-10-CM | POA: Diagnosis not present

## 2018-07-08 DIAGNOSIS — N2581 Secondary hyperparathyroidism of renal origin: Secondary | ICD-10-CM | POA: Diagnosis not present

## 2018-07-08 DIAGNOSIS — M75101 Unspecified rotator cuff tear or rupture of right shoulder, not specified as traumatic: Secondary | ICD-10-CM | POA: Diagnosis not present

## 2018-07-08 DIAGNOSIS — E785 Hyperlipidemia, unspecified: Secondary | ICD-10-CM | POA: Diagnosis not present

## 2018-07-08 DIAGNOSIS — I872 Venous insufficiency (chronic) (peripheral): Secondary | ICD-10-CM | POA: Diagnosis not present

## 2018-07-08 DIAGNOSIS — E1122 Type 2 diabetes mellitus with diabetic chronic kidney disease: Secondary | ICD-10-CM | POA: Diagnosis not present

## 2018-07-08 DIAGNOSIS — N184 Chronic kidney disease, stage 4 (severe): Secondary | ICD-10-CM | POA: Diagnosis not present

## 2018-07-08 DIAGNOSIS — M810 Age-related osteoporosis without current pathological fracture: Secondary | ICD-10-CM | POA: Diagnosis not present

## 2018-07-09 ENCOUNTER — Telehealth: Payer: Self-pay

## 2018-07-09 DIAGNOSIS — E538 Deficiency of other specified B group vitamins: Secondary | ICD-10-CM | POA: Diagnosis not present

## 2018-07-09 DIAGNOSIS — M810 Age-related osteoporosis without current pathological fracture: Secondary | ICD-10-CM | POA: Diagnosis not present

## 2018-07-09 DIAGNOSIS — E1122 Type 2 diabetes mellitus with diabetic chronic kidney disease: Secondary | ICD-10-CM | POA: Diagnosis not present

## 2018-07-09 DIAGNOSIS — M1990 Unspecified osteoarthritis, unspecified site: Secondary | ICD-10-CM | POA: Diagnosis not present

## 2018-07-09 DIAGNOSIS — I890 Lymphedema, not elsewhere classified: Secondary | ICD-10-CM | POA: Diagnosis not present

## 2018-07-09 DIAGNOSIS — M75101 Unspecified rotator cuff tear or rupture of right shoulder, not specified as traumatic: Secondary | ICD-10-CM | POA: Diagnosis not present

## 2018-07-09 DIAGNOSIS — N2581 Secondary hyperparathyroidism of renal origin: Secondary | ICD-10-CM | POA: Diagnosis not present

## 2018-07-09 DIAGNOSIS — M48061 Spinal stenosis, lumbar region without neurogenic claudication: Secondary | ICD-10-CM | POA: Diagnosis not present

## 2018-07-09 DIAGNOSIS — I872 Venous insufficiency (chronic) (peripheral): Secondary | ICD-10-CM | POA: Diagnosis not present

## 2018-07-09 DIAGNOSIS — I129 Hypertensive chronic kidney disease with stage 1 through stage 4 chronic kidney disease, or unspecified chronic kidney disease: Secondary | ICD-10-CM | POA: Diagnosis not present

## 2018-07-09 DIAGNOSIS — E785 Hyperlipidemia, unspecified: Secondary | ICD-10-CM | POA: Diagnosis not present

## 2018-07-09 DIAGNOSIS — N184 Chronic kidney disease, stage 4 (severe): Secondary | ICD-10-CM | POA: Diagnosis not present

## 2018-07-09 NOTE — Telephone Encounter (Signed)
HH called stating patient BP has been elevated and is now 180/65. Would like call back to discuss options and document that she did contact PCP.

## 2018-07-10 DIAGNOSIS — M75101 Unspecified rotator cuff tear or rupture of right shoulder, not specified as traumatic: Secondary | ICD-10-CM | POA: Diagnosis not present

## 2018-07-10 DIAGNOSIS — I890 Lymphedema, not elsewhere classified: Secondary | ICD-10-CM | POA: Diagnosis not present

## 2018-07-10 DIAGNOSIS — I872 Venous insufficiency (chronic) (peripheral): Secondary | ICD-10-CM | POA: Diagnosis not present

## 2018-07-10 DIAGNOSIS — I129 Hypertensive chronic kidney disease with stage 1 through stage 4 chronic kidney disease, or unspecified chronic kidney disease: Secondary | ICD-10-CM | POA: Diagnosis not present

## 2018-07-10 DIAGNOSIS — M48061 Spinal stenosis, lumbar region without neurogenic claudication: Secondary | ICD-10-CM | POA: Diagnosis not present

## 2018-07-10 DIAGNOSIS — E785 Hyperlipidemia, unspecified: Secondary | ICD-10-CM | POA: Diagnosis not present

## 2018-07-10 DIAGNOSIS — M810 Age-related osteoporosis without current pathological fracture: Secondary | ICD-10-CM | POA: Diagnosis not present

## 2018-07-10 DIAGNOSIS — E538 Deficiency of other specified B group vitamins: Secondary | ICD-10-CM | POA: Diagnosis not present

## 2018-07-10 DIAGNOSIS — N184 Chronic kidney disease, stage 4 (severe): Secondary | ICD-10-CM | POA: Diagnosis not present

## 2018-07-10 DIAGNOSIS — E1122 Type 2 diabetes mellitus with diabetic chronic kidney disease: Secondary | ICD-10-CM | POA: Diagnosis not present

## 2018-07-10 DIAGNOSIS — M1990 Unspecified osteoarthritis, unspecified site: Secondary | ICD-10-CM | POA: Diagnosis not present

## 2018-07-10 DIAGNOSIS — N2581 Secondary hyperparathyroidism of renal origin: Secondary | ICD-10-CM | POA: Diagnosis not present

## 2018-07-11 DIAGNOSIS — N184 Chronic kidney disease, stage 4 (severe): Secondary | ICD-10-CM | POA: Diagnosis not present

## 2018-07-11 DIAGNOSIS — E538 Deficiency of other specified B group vitamins: Secondary | ICD-10-CM | POA: Diagnosis not present

## 2018-07-11 DIAGNOSIS — M75101 Unspecified rotator cuff tear or rupture of right shoulder, not specified as traumatic: Secondary | ICD-10-CM | POA: Diagnosis not present

## 2018-07-11 DIAGNOSIS — E785 Hyperlipidemia, unspecified: Secondary | ICD-10-CM | POA: Diagnosis not present

## 2018-07-11 DIAGNOSIS — I129 Hypertensive chronic kidney disease with stage 1 through stage 4 chronic kidney disease, or unspecified chronic kidney disease: Secondary | ICD-10-CM | POA: Diagnosis not present

## 2018-07-11 DIAGNOSIS — M810 Age-related osteoporosis without current pathological fracture: Secondary | ICD-10-CM | POA: Diagnosis not present

## 2018-07-11 DIAGNOSIS — E1122 Type 2 diabetes mellitus with diabetic chronic kidney disease: Secondary | ICD-10-CM | POA: Diagnosis not present

## 2018-07-11 DIAGNOSIS — N2581 Secondary hyperparathyroidism of renal origin: Secondary | ICD-10-CM | POA: Diagnosis not present

## 2018-07-11 DIAGNOSIS — I890 Lymphedema, not elsewhere classified: Secondary | ICD-10-CM | POA: Diagnosis not present

## 2018-07-11 DIAGNOSIS — I872 Venous insufficiency (chronic) (peripheral): Secondary | ICD-10-CM | POA: Diagnosis not present

## 2018-07-11 DIAGNOSIS — M48061 Spinal stenosis, lumbar region without neurogenic claudication: Secondary | ICD-10-CM | POA: Diagnosis not present

## 2018-07-11 DIAGNOSIS — M1990 Unspecified osteoarthritis, unspecified site: Secondary | ICD-10-CM | POA: Diagnosis not present

## 2018-07-14 DIAGNOSIS — E785 Hyperlipidemia, unspecified: Secondary | ICD-10-CM | POA: Diagnosis not present

## 2018-07-14 DIAGNOSIS — E538 Deficiency of other specified B group vitamins: Secondary | ICD-10-CM | POA: Diagnosis not present

## 2018-07-14 DIAGNOSIS — M48061 Spinal stenosis, lumbar region without neurogenic claudication: Secondary | ICD-10-CM | POA: Diagnosis not present

## 2018-07-14 DIAGNOSIS — E1122 Type 2 diabetes mellitus with diabetic chronic kidney disease: Secondary | ICD-10-CM | POA: Diagnosis not present

## 2018-07-14 DIAGNOSIS — I890 Lymphedema, not elsewhere classified: Secondary | ICD-10-CM | POA: Diagnosis not present

## 2018-07-14 DIAGNOSIS — M1990 Unspecified osteoarthritis, unspecified site: Secondary | ICD-10-CM | POA: Diagnosis not present

## 2018-07-14 DIAGNOSIS — I129 Hypertensive chronic kidney disease with stage 1 through stage 4 chronic kidney disease, or unspecified chronic kidney disease: Secondary | ICD-10-CM | POA: Diagnosis not present

## 2018-07-14 DIAGNOSIS — M810 Age-related osteoporosis without current pathological fracture: Secondary | ICD-10-CM | POA: Diagnosis not present

## 2018-07-14 DIAGNOSIS — N184 Chronic kidney disease, stage 4 (severe): Secondary | ICD-10-CM | POA: Diagnosis not present

## 2018-07-14 DIAGNOSIS — I872 Venous insufficiency (chronic) (peripheral): Secondary | ICD-10-CM | POA: Diagnosis not present

## 2018-07-14 DIAGNOSIS — N2581 Secondary hyperparathyroidism of renal origin: Secondary | ICD-10-CM | POA: Diagnosis not present

## 2018-07-14 DIAGNOSIS — M75101 Unspecified rotator cuff tear or rupture of right shoulder, not specified as traumatic: Secondary | ICD-10-CM | POA: Diagnosis not present

## 2018-07-15 ENCOUNTER — Encounter: Payer: Self-pay | Admitting: Internal Medicine

## 2018-07-15 ENCOUNTER — Ambulatory Visit (INDEPENDENT_AMBULATORY_CARE_PROVIDER_SITE_OTHER): Payer: Medicare Other | Admitting: Internal Medicine

## 2018-07-15 VITALS — BP 146/92 | HR 83 | Ht 59.0 in | Wt 223.0 lb

## 2018-07-15 DIAGNOSIS — E785 Hyperlipidemia, unspecified: Secondary | ICD-10-CM | POA: Diagnosis not present

## 2018-07-15 DIAGNOSIS — N2581 Secondary hyperparathyroidism of renal origin: Secondary | ICD-10-CM | POA: Diagnosis not present

## 2018-07-15 DIAGNOSIS — I872 Venous insufficiency (chronic) (peripheral): Secondary | ICD-10-CM | POA: Diagnosis not present

## 2018-07-15 DIAGNOSIS — I10 Essential (primary) hypertension: Secondary | ICD-10-CM

## 2018-07-15 DIAGNOSIS — N184 Chronic kidney disease, stage 4 (severe): Secondary | ICD-10-CM

## 2018-07-15 DIAGNOSIS — I890 Lymphedema, not elsewhere classified: Secondary | ICD-10-CM | POA: Diagnosis not present

## 2018-07-15 DIAGNOSIS — M75101 Unspecified rotator cuff tear or rupture of right shoulder, not specified as traumatic: Secondary | ICD-10-CM | POA: Diagnosis not present

## 2018-07-15 DIAGNOSIS — E1122 Type 2 diabetes mellitus with diabetic chronic kidney disease: Secondary | ICD-10-CM

## 2018-07-15 DIAGNOSIS — I129 Hypertensive chronic kidney disease with stage 1 through stage 4 chronic kidney disease, or unspecified chronic kidney disease: Secondary | ICD-10-CM | POA: Diagnosis not present

## 2018-07-15 DIAGNOSIS — M48061 Spinal stenosis, lumbar region without neurogenic claudication: Secondary | ICD-10-CM | POA: Diagnosis not present

## 2018-07-15 DIAGNOSIS — M1990 Unspecified osteoarthritis, unspecified site: Secondary | ICD-10-CM | POA: Diagnosis not present

## 2018-07-15 DIAGNOSIS — E538 Deficiency of other specified B group vitamins: Secondary | ICD-10-CM | POA: Diagnosis not present

## 2018-07-15 DIAGNOSIS — M810 Age-related osteoporosis without current pathological fracture: Secondary | ICD-10-CM | POA: Diagnosis not present

## 2018-07-15 NOTE — Progress Notes (Signed)
Date:  07/15/2018   Name:  Heather Long   DOB:  1928/04/30   MRN:  [id number]   Chief Complaint: Hypertension (Elevated BP with [facility] nurse. Coming to have BP evaluated since its been continuing for over a week now. ) and Diabetes (Elevated Blood Sugars at home. )  Hypertension  This is a chronic problem. The problem has been waxing and waning since onset. Pertinent negatives include no chest pain, headaches, palpitations or shortness of breath. Past treatments include angiotensin blockers. The current treatment provides moderate improvement. There are no compliance problems.  Hypertensive end-organ damage includes kidney disease.  Diabetes  She presents for her follow-up diabetic visit. She has type 2 diabetes mellitus. Her disease course has been stable. Pertinent negatives for hypoglycemia include no dizziness, headaches or tremors. Pertinent negatives for diabetes include no chest pain, no foot paresthesias, no polyuria, no visual change and no weight loss. Symptoms are stable. Current diabetic treatment includes insulin injections. She is compliant with treatment all of the time. She monitors blood glucose at home 1-2 x per day. Her breakfast blood glucose is taken between 6-7 am. Her breakfast blood glucose range is generally 130-140 mg/dl. An ACE inhibitor/angiotensin II receptor blocker is being taken.   BP at home have been mostly 115 to 726 systolic, occasionally 203.  She feels lightheaded at times when standing or walking but no falls.  Does not have similar sx when sitting or lying down.  She has not corelated elevated BP to sx.  Edema - unchanged, patient continues to monitor.  Not wearing support hose - can not get them on and off herself.  Review of Systems  Constitutional: Negative for diaphoresis, fever and weight loss.  Eyes: Negative for visual disturbance.  Respiratory: Negative for cough, chest tightness and shortness of breath.   Cardiovascular: Positive for leg  swelling. Negative for chest pain and palpitations.  Gastrointestinal: Negative for diarrhea and nausea.  Endocrine: Negative for polyuria.  Skin: Positive for color change (no change in skin erythema anterior left shin).  Neurological: Positive for light-headedness. Negative for dizziness, tremors and headaches.  Psychiatric/Behavioral: Negative for sleep disturbance.    Patient Active Problem List   Diagnosis Date Noted  . Rotator cuff disorder, right 04/15/2018  . Secondary hyperparathyroidism of renal origin ([place]) 10/16/2017  . Venous stasis dermatitis of left lower extremity 07/16/2017  . Chronic congestion of paranasal sinus 03/13/2017  . Degeneration macular 05/23/2016  . Osteitis deformans 05/23/2016  . Neoplasm of uncertain behavior of skin of face 05/23/2016  . Psoriasis 05/23/2016  . Vitamin B12 deficiency 05/16/2016  . Obesity, Class III, BMI 40-49.9 (morbid obesity) ([place]) 05/02/2016  . Type 2 DM with CKD stage 4 and hypertension ([place]) 05/02/2016  . Vitamin D deficiency 05/02/2016  . Lower extremity edema 05/02/2016  . Spinal stenosis of lumbar region 05/02/2016  . Essential (primary) hypertension 06/02/2012  . Hyperlipidemia associated with type 2 diabetes mellitus ([place]) 06/02/2012  . History of breast cancer 06/14/2011  . Generalized OA 06/14/2011  . Lymphedema of arm 06/14/2011  . OP (osteoporosis) 06/14/2011    Allergies  Allergen Reactions  . Ciprofloxacin Nausea And Vomiting  . Gluten Meal Other (See Comments)    GI distress  . Pioglitazone Other (See Comments)    Leg swelling  . Gabapentin Diarrhea, Nausea Only and Other (See Comments)    dizziness    Past Surgical History:  Procedure Laterality Date  . ABDOMINAL HYSTERECTOMY    . JOINT REPLACEMENT  2000, 2006  . MASTECTOMY Left 2010  . ORIF WRIST FRACTURE  07/2016    Social History   Tobacco Use  . Smoking status: Former Smoker    Packs/day: 0.25    Years: 2.00    Pack years: 0.50     Types: Cigarettes    Last attempt to quit: 01/02/1952    Years since quitting: 66.5  . Smokeless tobacco: Never Used  . Tobacco comment: smoking cessation materials not required  Substance Use Topics  . Alcohol use: No  . Drug use: No     Medication list has been reviewed and updated. Current Meds  Medication Sig  . ACCU-CHEK AVIVA PLUS test strip USE AS DIRECTED THREE TIMES DAILY  . acetaminophen (TYLENOL) 325 MG tablet Take 650 mg by mouth every 6 (six) hours as needed.  . Ascorbic Acid (VITAMIN C) 1000 MG tablet Take 1,000 mg by mouth daily.  . B-D UF III MINI PEN NEEDLES 31G X 5 MM MISC USE AS DIRECTED EVERY DAY  . Cholecalciferol (VITAMIN D3) 5000 units CAPS Take by mouth. Daily  . diclofenac sodium (VOLTAREN) 1 % GEL Apply 4 g topically 4 (four) times daily. To affected area as needed  . glucose blood test strip Please dispence test strips and lancets of patient's choice.  Check sugars three times daily  . LANTUS SOLOSTAR 100 UNIT/ML Solostar Pen ADMINISTER 34 UNITS UNDER THE SKIN DAILY AT 10 PM (Patient taking differently: 32-34 Units. )  . losartan (COZAAR) 50 MG tablet Take 100 mg by mouth daily.   . Multiple Vitamins-Minerals (PRESERVISION AREDS PO) Take by mouth.  . vitamin B-12 (CYANOCOBALAMIN) 1000 MCG tablet Take 1 tablet (1,000 mcg total) by mouth daily.      PHQ 2/9 Scores 04/15/2018 12/09/2017 11/15/2017 10/31/2017  PHQ - 2 Score 1 2 1  0  PHQ- 9 Score - 7 - -    Physical Exam  Constitutional: She is oriented to person, place, and time. She appears well-developed.  HENT:  Moderate cerumen in both ears removed with curette.  Neck: Normal range of motion. Neck supple.  Cardiovascular: Normal rate, regular rhythm and normal heart sounds.  Pulmonary/Chest: Effort normal and breath sounds normal.  Musculoskeletal: She exhibits edema.  Neurological: She is alert and oriented to person, place, and time.  Skin: There is erythema.  Psychiatric: She has a normal mood  and affect. Her speech is normal and behavior is normal.   No data found.   BP (!) 146/92 (BP Location: Right Arm, Patient Position: Sitting, Cuff Size: Normal)   Pulse 83   Ht 4\' 11"  (1.499 m)   Wt 223 lb (101.2 kg)   SpO2 95%   BMI 45.04 kg/m   Assessment and Plan: 1. Essential (primary) hypertension Fair control - continue losartan 100 mg Lightheadedness likely due to mild decrease in BP with standing although orthostatics where negative  2. Type 2 DM with CKD stage 4 and hypertension ([place]) Controlled Will check A1C next visit  3. Venous stasis dermatitis of left lower extremity Chronic, unchanged   No orders of the defined types were placed in this encounter.   Partially dictated using Editor, commissioning. Any errors are unintentional.  Halina Maidens, MD [facility]  07/15/2018    There are no diagnoses linked to this encounter.

## 2018-07-16 DIAGNOSIS — M1990 Unspecified osteoarthritis, unspecified site: Secondary | ICD-10-CM | POA: Diagnosis not present

## 2018-07-16 DIAGNOSIS — M810 Age-related osteoporosis without current pathological fracture: Secondary | ICD-10-CM | POA: Diagnosis not present

## 2018-07-16 DIAGNOSIS — M75101 Unspecified rotator cuff tear or rupture of right shoulder, not specified as traumatic: Secondary | ICD-10-CM | POA: Diagnosis not present

## 2018-07-16 DIAGNOSIS — I890 Lymphedema, not elsewhere classified: Secondary | ICD-10-CM | POA: Diagnosis not present

## 2018-07-16 DIAGNOSIS — N184 Chronic kidney disease, stage 4 (severe): Secondary | ICD-10-CM | POA: Diagnosis not present

## 2018-07-16 DIAGNOSIS — N2581 Secondary hyperparathyroidism of renal origin: Secondary | ICD-10-CM | POA: Diagnosis not present

## 2018-07-16 DIAGNOSIS — I872 Venous insufficiency (chronic) (peripheral): Secondary | ICD-10-CM | POA: Diagnosis not present

## 2018-07-16 DIAGNOSIS — E538 Deficiency of other specified B group vitamins: Secondary | ICD-10-CM | POA: Diagnosis not present

## 2018-07-16 DIAGNOSIS — E1122 Type 2 diabetes mellitus with diabetic chronic kidney disease: Secondary | ICD-10-CM | POA: Diagnosis not present

## 2018-07-16 DIAGNOSIS — E785 Hyperlipidemia, unspecified: Secondary | ICD-10-CM | POA: Diagnosis not present

## 2018-07-16 DIAGNOSIS — M48061 Spinal stenosis, lumbar region without neurogenic claudication: Secondary | ICD-10-CM | POA: Diagnosis not present

## 2018-07-16 DIAGNOSIS — I129 Hypertensive chronic kidney disease with stage 1 through stage 4 chronic kidney disease, or unspecified chronic kidney disease: Secondary | ICD-10-CM | POA: Diagnosis not present

## 2018-07-17 DIAGNOSIS — E785 Hyperlipidemia, unspecified: Secondary | ICD-10-CM | POA: Diagnosis not present

## 2018-07-17 DIAGNOSIS — E538 Deficiency of other specified B group vitamins: Secondary | ICD-10-CM | POA: Diagnosis not present

## 2018-07-17 DIAGNOSIS — N184 Chronic kidney disease, stage 4 (severe): Secondary | ICD-10-CM | POA: Diagnosis not present

## 2018-07-17 DIAGNOSIS — M1990 Unspecified osteoarthritis, unspecified site: Secondary | ICD-10-CM | POA: Diagnosis not present

## 2018-07-17 DIAGNOSIS — M48061 Spinal stenosis, lumbar region without neurogenic claudication: Secondary | ICD-10-CM | POA: Diagnosis not present

## 2018-07-17 DIAGNOSIS — M75101 Unspecified rotator cuff tear or rupture of right shoulder, not specified as traumatic: Secondary | ICD-10-CM | POA: Diagnosis not present

## 2018-07-17 DIAGNOSIS — I890 Lymphedema, not elsewhere classified: Secondary | ICD-10-CM | POA: Diagnosis not present

## 2018-07-17 DIAGNOSIS — N2581 Secondary hyperparathyroidism of renal origin: Secondary | ICD-10-CM | POA: Diagnosis not present

## 2018-07-17 DIAGNOSIS — I129 Hypertensive chronic kidney disease with stage 1 through stage 4 chronic kidney disease, or unspecified chronic kidney disease: Secondary | ICD-10-CM | POA: Diagnosis not present

## 2018-07-17 DIAGNOSIS — I872 Venous insufficiency (chronic) (peripheral): Secondary | ICD-10-CM | POA: Diagnosis not present

## 2018-07-17 DIAGNOSIS — M810 Age-related osteoporosis without current pathological fracture: Secondary | ICD-10-CM | POA: Diagnosis not present

## 2018-07-17 DIAGNOSIS — E1122 Type 2 diabetes mellitus with diabetic chronic kidney disease: Secondary | ICD-10-CM | POA: Diagnosis not present

## 2018-07-18 DIAGNOSIS — N2581 Secondary hyperparathyroidism of renal origin: Secondary | ICD-10-CM | POA: Diagnosis not present

## 2018-07-18 DIAGNOSIS — M48061 Spinal stenosis, lumbar region without neurogenic claudication: Secondary | ICD-10-CM | POA: Diagnosis not present

## 2018-07-18 DIAGNOSIS — M810 Age-related osteoporosis without current pathological fracture: Secondary | ICD-10-CM | POA: Diagnosis not present

## 2018-07-18 DIAGNOSIS — M75101 Unspecified rotator cuff tear or rupture of right shoulder, not specified as traumatic: Secondary | ICD-10-CM | POA: Diagnosis not present

## 2018-07-18 DIAGNOSIS — E538 Deficiency of other specified B group vitamins: Secondary | ICD-10-CM | POA: Diagnosis not present

## 2018-07-18 DIAGNOSIS — E785 Hyperlipidemia, unspecified: Secondary | ICD-10-CM | POA: Diagnosis not present

## 2018-07-18 DIAGNOSIS — M1990 Unspecified osteoarthritis, unspecified site: Secondary | ICD-10-CM | POA: Diagnosis not present

## 2018-07-18 DIAGNOSIS — I890 Lymphedema, not elsewhere classified: Secondary | ICD-10-CM | POA: Diagnosis not present

## 2018-07-18 DIAGNOSIS — I129 Hypertensive chronic kidney disease with stage 1 through stage 4 chronic kidney disease, or unspecified chronic kidney disease: Secondary | ICD-10-CM | POA: Diagnosis not present

## 2018-07-18 DIAGNOSIS — I872 Venous insufficiency (chronic) (peripheral): Secondary | ICD-10-CM | POA: Diagnosis not present

## 2018-07-18 DIAGNOSIS — N184 Chronic kidney disease, stage 4 (severe): Secondary | ICD-10-CM | POA: Diagnosis not present

## 2018-07-18 DIAGNOSIS — E1122 Type 2 diabetes mellitus with diabetic chronic kidney disease: Secondary | ICD-10-CM | POA: Diagnosis not present

## 2018-07-22 DIAGNOSIS — I890 Lymphedema, not elsewhere classified: Secondary | ICD-10-CM | POA: Diagnosis not present

## 2018-07-22 DIAGNOSIS — M75101 Unspecified rotator cuff tear or rupture of right shoulder, not specified as traumatic: Secondary | ICD-10-CM | POA: Diagnosis not present

## 2018-07-22 DIAGNOSIS — M810 Age-related osteoporosis without current pathological fracture: Secondary | ICD-10-CM | POA: Diagnosis not present

## 2018-07-22 DIAGNOSIS — M48061 Spinal stenosis, lumbar region without neurogenic claudication: Secondary | ICD-10-CM | POA: Diagnosis not present

## 2018-07-22 DIAGNOSIS — E538 Deficiency of other specified B group vitamins: Secondary | ICD-10-CM | POA: Diagnosis not present

## 2018-07-22 DIAGNOSIS — I129 Hypertensive chronic kidney disease with stage 1 through stage 4 chronic kidney disease, or unspecified chronic kidney disease: Secondary | ICD-10-CM | POA: Diagnosis not present

## 2018-07-22 DIAGNOSIS — N184 Chronic kidney disease, stage 4 (severe): Secondary | ICD-10-CM | POA: Diagnosis not present

## 2018-07-22 DIAGNOSIS — I872 Venous insufficiency (chronic) (peripheral): Secondary | ICD-10-CM | POA: Diagnosis not present

## 2018-07-22 DIAGNOSIS — N2581 Secondary hyperparathyroidism of renal origin: Secondary | ICD-10-CM | POA: Diagnosis not present

## 2018-07-22 DIAGNOSIS — E785 Hyperlipidemia, unspecified: Secondary | ICD-10-CM | POA: Diagnosis not present

## 2018-07-22 DIAGNOSIS — M1990 Unspecified osteoarthritis, unspecified site: Secondary | ICD-10-CM | POA: Diagnosis not present

## 2018-07-22 DIAGNOSIS — E1122 Type 2 diabetes mellitus with diabetic chronic kidney disease: Secondary | ICD-10-CM | POA: Diagnosis not present

## 2018-07-23 DIAGNOSIS — M48061 Spinal stenosis, lumbar region without neurogenic claudication: Secondary | ICD-10-CM | POA: Diagnosis not present

## 2018-07-23 DIAGNOSIS — E785 Hyperlipidemia, unspecified: Secondary | ICD-10-CM | POA: Diagnosis not present

## 2018-07-23 DIAGNOSIS — M75101 Unspecified rotator cuff tear or rupture of right shoulder, not specified as traumatic: Secondary | ICD-10-CM | POA: Diagnosis not present

## 2018-07-23 DIAGNOSIS — M810 Age-related osteoporosis without current pathological fracture: Secondary | ICD-10-CM | POA: Diagnosis not present

## 2018-07-23 DIAGNOSIS — E538 Deficiency of other specified B group vitamins: Secondary | ICD-10-CM | POA: Diagnosis not present

## 2018-07-23 DIAGNOSIS — I890 Lymphedema, not elsewhere classified: Secondary | ICD-10-CM | POA: Diagnosis not present

## 2018-07-23 DIAGNOSIS — N2581 Secondary hyperparathyroidism of renal origin: Secondary | ICD-10-CM | POA: Diagnosis not present

## 2018-07-23 DIAGNOSIS — N184 Chronic kidney disease, stage 4 (severe): Secondary | ICD-10-CM | POA: Diagnosis not present

## 2018-07-23 DIAGNOSIS — I872 Venous insufficiency (chronic) (peripheral): Secondary | ICD-10-CM | POA: Diagnosis not present

## 2018-07-23 DIAGNOSIS — E1122 Type 2 diabetes mellitus with diabetic chronic kidney disease: Secondary | ICD-10-CM | POA: Diagnosis not present

## 2018-07-23 DIAGNOSIS — I129 Hypertensive chronic kidney disease with stage 1 through stage 4 chronic kidney disease, or unspecified chronic kidney disease: Secondary | ICD-10-CM | POA: Diagnosis not present

## 2018-07-23 DIAGNOSIS — M1990 Unspecified osteoarthritis, unspecified site: Secondary | ICD-10-CM | POA: Diagnosis not present

## 2018-07-24 DIAGNOSIS — M75101 Unspecified rotator cuff tear or rupture of right shoulder, not specified as traumatic: Secondary | ICD-10-CM | POA: Diagnosis not present

## 2018-07-24 DIAGNOSIS — M1990 Unspecified osteoarthritis, unspecified site: Secondary | ICD-10-CM | POA: Diagnosis not present

## 2018-07-24 DIAGNOSIS — N2581 Secondary hyperparathyroidism of renal origin: Secondary | ICD-10-CM | POA: Diagnosis not present

## 2018-07-24 DIAGNOSIS — E785 Hyperlipidemia, unspecified: Secondary | ICD-10-CM | POA: Diagnosis not present

## 2018-07-24 DIAGNOSIS — N184 Chronic kidney disease, stage 4 (severe): Secondary | ICD-10-CM | POA: Diagnosis not present

## 2018-07-24 DIAGNOSIS — I129 Hypertensive chronic kidney disease with stage 1 through stage 4 chronic kidney disease, or unspecified chronic kidney disease: Secondary | ICD-10-CM | POA: Diagnosis not present

## 2018-07-24 DIAGNOSIS — M810 Age-related osteoporosis without current pathological fracture: Secondary | ICD-10-CM | POA: Diagnosis not present

## 2018-07-24 DIAGNOSIS — E538 Deficiency of other specified B group vitamins: Secondary | ICD-10-CM | POA: Diagnosis not present

## 2018-07-24 DIAGNOSIS — M48061 Spinal stenosis, lumbar region without neurogenic claudication: Secondary | ICD-10-CM | POA: Diagnosis not present

## 2018-07-24 DIAGNOSIS — I872 Venous insufficiency (chronic) (peripheral): Secondary | ICD-10-CM | POA: Diagnosis not present

## 2018-07-24 DIAGNOSIS — E1122 Type 2 diabetes mellitus with diabetic chronic kidney disease: Secondary | ICD-10-CM | POA: Diagnosis not present

## 2018-07-24 DIAGNOSIS — I890 Lymphedema, not elsewhere classified: Secondary | ICD-10-CM | POA: Diagnosis not present

## 2018-07-25 DIAGNOSIS — M75101 Unspecified rotator cuff tear or rupture of right shoulder, not specified as traumatic: Secondary | ICD-10-CM | POA: Diagnosis not present

## 2018-07-25 DIAGNOSIS — E1122 Type 2 diabetes mellitus with diabetic chronic kidney disease: Secondary | ICD-10-CM | POA: Diagnosis not present

## 2018-07-25 DIAGNOSIS — I872 Venous insufficiency (chronic) (peripheral): Secondary | ICD-10-CM | POA: Diagnosis not present

## 2018-07-25 DIAGNOSIS — M810 Age-related osteoporosis without current pathological fracture: Secondary | ICD-10-CM | POA: Diagnosis not present

## 2018-07-25 DIAGNOSIS — E785 Hyperlipidemia, unspecified: Secondary | ICD-10-CM | POA: Diagnosis not present

## 2018-07-25 DIAGNOSIS — M48061 Spinal stenosis, lumbar region without neurogenic claudication: Secondary | ICD-10-CM | POA: Diagnosis not present

## 2018-07-25 DIAGNOSIS — I129 Hypertensive chronic kidney disease with stage 1 through stage 4 chronic kidney disease, or unspecified chronic kidney disease: Secondary | ICD-10-CM | POA: Diagnosis not present

## 2018-07-25 DIAGNOSIS — E538 Deficiency of other specified B group vitamins: Secondary | ICD-10-CM | POA: Diagnosis not present

## 2018-07-25 DIAGNOSIS — N184 Chronic kidney disease, stage 4 (severe): Secondary | ICD-10-CM | POA: Diagnosis not present

## 2018-07-25 DIAGNOSIS — N2581 Secondary hyperparathyroidism of renal origin: Secondary | ICD-10-CM | POA: Diagnosis not present

## 2018-07-25 DIAGNOSIS — M1990 Unspecified osteoarthritis, unspecified site: Secondary | ICD-10-CM | POA: Diagnosis not present

## 2018-07-25 DIAGNOSIS — I890 Lymphedema, not elsewhere classified: Secondary | ICD-10-CM | POA: Diagnosis not present

## 2018-07-29 DIAGNOSIS — I872 Venous insufficiency (chronic) (peripheral): Secondary | ICD-10-CM | POA: Diagnosis not present

## 2018-07-29 DIAGNOSIS — M75101 Unspecified rotator cuff tear or rupture of right shoulder, not specified as traumatic: Secondary | ICD-10-CM | POA: Diagnosis not present

## 2018-07-29 DIAGNOSIS — E538 Deficiency of other specified B group vitamins: Secondary | ICD-10-CM | POA: Diagnosis not present

## 2018-07-29 DIAGNOSIS — M810 Age-related osteoporosis without current pathological fracture: Secondary | ICD-10-CM | POA: Diagnosis not present

## 2018-07-29 DIAGNOSIS — E1122 Type 2 diabetes mellitus with diabetic chronic kidney disease: Secondary | ICD-10-CM | POA: Diagnosis not present

## 2018-07-29 DIAGNOSIS — M48061 Spinal stenosis, lumbar region without neurogenic claudication: Secondary | ICD-10-CM | POA: Diagnosis not present

## 2018-07-29 DIAGNOSIS — N2581 Secondary hyperparathyroidism of renal origin: Secondary | ICD-10-CM | POA: Diagnosis not present

## 2018-07-29 DIAGNOSIS — E785 Hyperlipidemia, unspecified: Secondary | ICD-10-CM | POA: Diagnosis not present

## 2018-07-29 DIAGNOSIS — M1990 Unspecified osteoarthritis, unspecified site: Secondary | ICD-10-CM | POA: Diagnosis not present

## 2018-07-29 DIAGNOSIS — N184 Chronic kidney disease, stage 4 (severe): Secondary | ICD-10-CM | POA: Diagnosis not present

## 2018-07-29 DIAGNOSIS — I129 Hypertensive chronic kidney disease with stage 1 through stage 4 chronic kidney disease, or unspecified chronic kidney disease: Secondary | ICD-10-CM | POA: Diagnosis not present

## 2018-07-29 DIAGNOSIS — I890 Lymphedema, not elsewhere classified: Secondary | ICD-10-CM | POA: Diagnosis not present

## 2018-07-30 DIAGNOSIS — M75101 Unspecified rotator cuff tear or rupture of right shoulder, not specified as traumatic: Secondary | ICD-10-CM | POA: Diagnosis not present

## 2018-07-30 DIAGNOSIS — E538 Deficiency of other specified B group vitamins: Secondary | ICD-10-CM | POA: Diagnosis not present

## 2018-07-30 DIAGNOSIS — M48061 Spinal stenosis, lumbar region without neurogenic claudication: Secondary | ICD-10-CM | POA: Diagnosis not present

## 2018-07-30 DIAGNOSIS — I890 Lymphedema, not elsewhere classified: Secondary | ICD-10-CM | POA: Diagnosis not present

## 2018-07-30 DIAGNOSIS — M810 Age-related osteoporosis without current pathological fracture: Secondary | ICD-10-CM | POA: Diagnosis not present

## 2018-07-30 DIAGNOSIS — E1122 Type 2 diabetes mellitus with diabetic chronic kidney disease: Secondary | ICD-10-CM | POA: Diagnosis not present

## 2018-07-30 DIAGNOSIS — N2581 Secondary hyperparathyroidism of renal origin: Secondary | ICD-10-CM | POA: Diagnosis not present

## 2018-07-30 DIAGNOSIS — N184 Chronic kidney disease, stage 4 (severe): Secondary | ICD-10-CM | POA: Diagnosis not present

## 2018-07-30 DIAGNOSIS — M1990 Unspecified osteoarthritis, unspecified site: Secondary | ICD-10-CM | POA: Diagnosis not present

## 2018-07-30 DIAGNOSIS — I872 Venous insufficiency (chronic) (peripheral): Secondary | ICD-10-CM | POA: Diagnosis not present

## 2018-07-30 DIAGNOSIS — I129 Hypertensive chronic kidney disease with stage 1 through stage 4 chronic kidney disease, or unspecified chronic kidney disease: Secondary | ICD-10-CM | POA: Diagnosis not present

## 2018-07-30 DIAGNOSIS — E785 Hyperlipidemia, unspecified: Secondary | ICD-10-CM | POA: Diagnosis not present

## 2018-07-31 DIAGNOSIS — I129 Hypertensive chronic kidney disease with stage 1 through stage 4 chronic kidney disease, or unspecified chronic kidney disease: Secondary | ICD-10-CM | POA: Diagnosis not present

## 2018-07-31 DIAGNOSIS — M48061 Spinal stenosis, lumbar region without neurogenic claudication: Secondary | ICD-10-CM | POA: Diagnosis not present

## 2018-07-31 DIAGNOSIS — M810 Age-related osteoporosis without current pathological fracture: Secondary | ICD-10-CM | POA: Diagnosis not present

## 2018-07-31 DIAGNOSIS — M1990 Unspecified osteoarthritis, unspecified site: Secondary | ICD-10-CM | POA: Diagnosis not present

## 2018-07-31 DIAGNOSIS — N184 Chronic kidney disease, stage 4 (severe): Secondary | ICD-10-CM | POA: Diagnosis not present

## 2018-07-31 DIAGNOSIS — E1122 Type 2 diabetes mellitus with diabetic chronic kidney disease: Secondary | ICD-10-CM | POA: Diagnosis not present

## 2018-07-31 DIAGNOSIS — E785 Hyperlipidemia, unspecified: Secondary | ICD-10-CM | POA: Diagnosis not present

## 2018-07-31 DIAGNOSIS — N2581 Secondary hyperparathyroidism of renal origin: Secondary | ICD-10-CM | POA: Diagnosis not present

## 2018-07-31 DIAGNOSIS — I872 Venous insufficiency (chronic) (peripheral): Secondary | ICD-10-CM | POA: Diagnosis not present

## 2018-07-31 DIAGNOSIS — E538 Deficiency of other specified B group vitamins: Secondary | ICD-10-CM | POA: Diagnosis not present

## 2018-07-31 DIAGNOSIS — M75101 Unspecified rotator cuff tear or rupture of right shoulder, not specified as traumatic: Secondary | ICD-10-CM | POA: Diagnosis not present

## 2018-07-31 DIAGNOSIS — I890 Lymphedema, not elsewhere classified: Secondary | ICD-10-CM | POA: Diagnosis not present

## 2018-08-01 DIAGNOSIS — I129 Hypertensive chronic kidney disease with stage 1 through stage 4 chronic kidney disease, or unspecified chronic kidney disease: Secondary | ICD-10-CM | POA: Diagnosis not present

## 2018-08-01 DIAGNOSIS — E538 Deficiency of other specified B group vitamins: Secondary | ICD-10-CM | POA: Diagnosis not present

## 2018-08-01 DIAGNOSIS — E785 Hyperlipidemia, unspecified: Secondary | ICD-10-CM | POA: Diagnosis not present

## 2018-08-01 DIAGNOSIS — M1990 Unspecified osteoarthritis, unspecified site: Secondary | ICD-10-CM | POA: Diagnosis not present

## 2018-08-01 DIAGNOSIS — N184 Chronic kidney disease, stage 4 (severe): Secondary | ICD-10-CM | POA: Diagnosis not present

## 2018-08-01 DIAGNOSIS — N2581 Secondary hyperparathyroidism of renal origin: Secondary | ICD-10-CM | POA: Diagnosis not present

## 2018-08-01 DIAGNOSIS — M48061 Spinal stenosis, lumbar region without neurogenic claudication: Secondary | ICD-10-CM | POA: Diagnosis not present

## 2018-08-01 DIAGNOSIS — M810 Age-related osteoporosis without current pathological fracture: Secondary | ICD-10-CM | POA: Diagnosis not present

## 2018-08-01 DIAGNOSIS — I890 Lymphedema, not elsewhere classified: Secondary | ICD-10-CM | POA: Diagnosis not present

## 2018-08-01 DIAGNOSIS — I872 Venous insufficiency (chronic) (peripheral): Secondary | ICD-10-CM | POA: Diagnosis not present

## 2018-08-01 DIAGNOSIS — M75101 Unspecified rotator cuff tear or rupture of right shoulder, not specified as traumatic: Secondary | ICD-10-CM | POA: Diagnosis not present

## 2018-08-01 DIAGNOSIS — E1122 Type 2 diabetes mellitus with diabetic chronic kidney disease: Secondary | ICD-10-CM | POA: Diagnosis not present

## 2018-08-06 ENCOUNTER — Other Ambulatory Visit (INDEPENDENT_AMBULATORY_CARE_PROVIDER_SITE_OTHER): Payer: Medicare Other | Admitting: Internal Medicine

## 2018-08-06 DIAGNOSIS — N184 Chronic kidney disease, stage 4 (severe): Secondary | ICD-10-CM

## 2018-08-06 DIAGNOSIS — E1122 Type 2 diabetes mellitus with diabetic chronic kidney disease: Secondary | ICD-10-CM | POA: Diagnosis not present

## 2018-08-06 DIAGNOSIS — I890 Lymphedema, not elsewhere classified: Secondary | ICD-10-CM

## 2018-08-06 DIAGNOSIS — E559 Vitamin D deficiency, unspecified: Secondary | ICD-10-CM

## 2018-08-06 DIAGNOSIS — I10 Essential (primary) hypertension: Secondary | ICD-10-CM

## 2018-08-06 DIAGNOSIS — E1169 Type 2 diabetes mellitus with other specified complication: Secondary | ICD-10-CM

## 2018-08-06 DIAGNOSIS — I129 Hypertensive chronic kidney disease with stage 1 through stage 4 chronic kidney disease, or unspecified chronic kidney disease: Secondary | ICD-10-CM

## 2018-08-06 DIAGNOSIS — M810 Age-related osteoporosis without current pathological fracture: Secondary | ICD-10-CM

## 2018-08-06 DIAGNOSIS — M48061 Spinal stenosis, lumbar region without neurogenic claudication: Secondary | ICD-10-CM

## 2018-08-06 DIAGNOSIS — N2581 Secondary hyperparathyroidism of renal origin: Secondary | ICD-10-CM

## 2018-08-06 DIAGNOSIS — E785 Hyperlipidemia, unspecified: Secondary | ICD-10-CM

## 2018-08-06 DIAGNOSIS — R600 Localized edema: Secondary | ICD-10-CM

## 2018-08-06 NOTE — Progress Notes (Signed)
Received home health order from [facility]. Start of care 06/07/18 with dates of care 06/07/18 through 08/05/18. Orders are reviewed, signed and faxed.

## 2018-08-07 DIAGNOSIS — M75101 Unspecified rotator cuff tear or rupture of right shoulder, not specified as traumatic: Secondary | ICD-10-CM | POA: Diagnosis not present

## 2018-08-07 DIAGNOSIS — N2581 Secondary hyperparathyroidism of renal origin: Secondary | ICD-10-CM | POA: Diagnosis not present

## 2018-08-07 DIAGNOSIS — N184 Chronic kidney disease, stage 4 (severe): Secondary | ICD-10-CM | POA: Diagnosis not present

## 2018-08-07 DIAGNOSIS — E559 Vitamin D deficiency, unspecified: Secondary | ICD-10-CM | POA: Diagnosis not present

## 2018-08-07 DIAGNOSIS — M1990 Unspecified osteoarthritis, unspecified site: Secondary | ICD-10-CM | POA: Diagnosis not present

## 2018-08-07 DIAGNOSIS — E785 Hyperlipidemia, unspecified: Secondary | ICD-10-CM | POA: Diagnosis not present

## 2018-08-07 DIAGNOSIS — I890 Lymphedema, not elsewhere classified: Secondary | ICD-10-CM | POA: Diagnosis not present

## 2018-08-07 DIAGNOSIS — I872 Venous insufficiency (chronic) (peripheral): Secondary | ICD-10-CM | POA: Diagnosis not present

## 2018-08-07 DIAGNOSIS — I129 Hypertensive chronic kidney disease with stage 1 through stage 4 chronic kidney disease, or unspecified chronic kidney disease: Secondary | ICD-10-CM | POA: Diagnosis not present

## 2018-08-07 DIAGNOSIS — M810 Age-related osteoporosis without current pathological fracture: Secondary | ICD-10-CM | POA: Diagnosis not present

## 2018-08-07 DIAGNOSIS — M48061 Spinal stenosis, lumbar region without neurogenic claudication: Secondary | ICD-10-CM | POA: Diagnosis not present

## 2018-08-07 DIAGNOSIS — E538 Deficiency of other specified B group vitamins: Secondary | ICD-10-CM | POA: Diagnosis not present

## 2018-08-07 DIAGNOSIS — E1122 Type 2 diabetes mellitus with diabetic chronic kidney disease: Secondary | ICD-10-CM | POA: Diagnosis not present

## 2018-08-11 ENCOUNTER — Other Ambulatory Visit: Payer: Self-pay | Admitting: Internal Medicine

## 2018-08-13 DIAGNOSIS — M48061 Spinal stenosis, lumbar region without neurogenic claudication: Secondary | ICD-10-CM | POA: Diagnosis not present

## 2018-08-13 DIAGNOSIS — I872 Venous insufficiency (chronic) (peripheral): Secondary | ICD-10-CM | POA: Diagnosis not present

## 2018-08-13 DIAGNOSIS — I890 Lymphedema, not elsewhere classified: Secondary | ICD-10-CM | POA: Diagnosis not present

## 2018-08-13 DIAGNOSIS — N184 Chronic kidney disease, stage 4 (severe): Secondary | ICD-10-CM | POA: Diagnosis not present

## 2018-08-13 DIAGNOSIS — M75101 Unspecified rotator cuff tear or rupture of right shoulder, not specified as traumatic: Secondary | ICD-10-CM | POA: Diagnosis not present

## 2018-08-13 DIAGNOSIS — E559 Vitamin D deficiency, unspecified: Secondary | ICD-10-CM | POA: Diagnosis not present

## 2018-08-13 DIAGNOSIS — E785 Hyperlipidemia, unspecified: Secondary | ICD-10-CM | POA: Diagnosis not present

## 2018-08-13 DIAGNOSIS — I129 Hypertensive chronic kidney disease with stage 1 through stage 4 chronic kidney disease, or unspecified chronic kidney disease: Secondary | ICD-10-CM | POA: Diagnosis not present

## 2018-08-13 DIAGNOSIS — N2581 Secondary hyperparathyroidism of renal origin: Secondary | ICD-10-CM | POA: Diagnosis not present

## 2018-08-13 DIAGNOSIS — E1122 Type 2 diabetes mellitus with diabetic chronic kidney disease: Secondary | ICD-10-CM | POA: Diagnosis not present

## 2018-08-13 DIAGNOSIS — E538 Deficiency of other specified B group vitamins: Secondary | ICD-10-CM | POA: Diagnosis not present

## 2018-08-13 DIAGNOSIS — M810 Age-related osteoporosis without current pathological fracture: Secondary | ICD-10-CM | POA: Diagnosis not present

## 2018-08-13 DIAGNOSIS — M1990 Unspecified osteoarthritis, unspecified site: Secondary | ICD-10-CM | POA: Diagnosis not present

## 2018-08-15 DIAGNOSIS — Z853 Personal history of malignant neoplasm of breast: Secondary | ICD-10-CM | POA: Diagnosis not present

## 2018-08-15 DIAGNOSIS — E559 Vitamin D deficiency, unspecified: Secondary | ICD-10-CM | POA: Diagnosis not present

## 2018-08-15 DIAGNOSIS — Z9181 History of falling: Secondary | ICD-10-CM | POA: Diagnosis not present

## 2018-08-15 DIAGNOSIS — N184 Chronic kidney disease, stage 4 (severe): Secondary | ICD-10-CM | POA: Diagnosis not present

## 2018-08-15 DIAGNOSIS — I872 Venous insufficiency (chronic) (peripheral): Secondary | ICD-10-CM | POA: Diagnosis not present

## 2018-08-15 DIAGNOSIS — E538 Deficiency of other specified B group vitamins: Secondary | ICD-10-CM | POA: Diagnosis not present

## 2018-08-15 DIAGNOSIS — Z87891 Personal history of nicotine dependence: Secondary | ICD-10-CM | POA: Diagnosis not present

## 2018-08-15 DIAGNOSIS — E785 Hyperlipidemia, unspecified: Secondary | ICD-10-CM | POA: Diagnosis not present

## 2018-08-15 DIAGNOSIS — I890 Lymphedema, not elsewhere classified: Secondary | ICD-10-CM | POA: Diagnosis not present

## 2018-08-15 DIAGNOSIS — I129 Hypertensive chronic kidney disease with stage 1 through stage 4 chronic kidney disease, or unspecified chronic kidney disease: Secondary | ICD-10-CM | POA: Diagnosis not present

## 2018-08-15 DIAGNOSIS — M48061 Spinal stenosis, lumbar region without neurogenic claudication: Secondary | ICD-10-CM | POA: Diagnosis not present

## 2018-08-15 DIAGNOSIS — N2581 Secondary hyperparathyroidism of renal origin: Secondary | ICD-10-CM | POA: Diagnosis not present

## 2018-08-15 DIAGNOSIS — M75101 Unspecified rotator cuff tear or rupture of right shoulder, not specified as traumatic: Secondary | ICD-10-CM | POA: Diagnosis not present

## 2018-08-15 DIAGNOSIS — E1122 Type 2 diabetes mellitus with diabetic chronic kidney disease: Secondary | ICD-10-CM | POA: Diagnosis not present

## 2018-08-15 DIAGNOSIS — M1990 Unspecified osteoarthritis, unspecified site: Secondary | ICD-10-CM | POA: Diagnosis not present

## 2018-08-15 DIAGNOSIS — Z794 Long term (current) use of insulin: Secondary | ICD-10-CM | POA: Diagnosis not present

## 2018-08-15 DIAGNOSIS — M810 Age-related osteoporosis without current pathological fracture: Secondary | ICD-10-CM | POA: Diagnosis not present

## 2018-08-17 ENCOUNTER — Encounter: Payer: Self-pay | Admitting: Internal Medicine

## 2018-08-18 ENCOUNTER — Encounter: Payer: Self-pay | Admitting: Internal Medicine

## 2018-08-18 ENCOUNTER — Ambulatory Visit (INDEPENDENT_AMBULATORY_CARE_PROVIDER_SITE_OTHER): Payer: Medicare Other | Admitting: Internal Medicine

## 2018-08-18 VITALS — BP 132/82 | HR 92 | Resp 16 | Ht 59.0 in | Wt 232.0 lb

## 2018-08-18 DIAGNOSIS — N184 Chronic kidney disease, stage 4 (severe): Secondary | ICD-10-CM

## 2018-08-18 DIAGNOSIS — E1169 Type 2 diabetes mellitus with other specified complication: Secondary | ICD-10-CM | POA: Diagnosis not present

## 2018-08-18 DIAGNOSIS — E1142 Type 2 diabetes mellitus with diabetic polyneuropathy: Secondary | ICD-10-CM | POA: Diagnosis not present

## 2018-08-18 DIAGNOSIS — E785 Hyperlipidemia, unspecified: Secondary | ICD-10-CM | POA: Diagnosis not present

## 2018-08-18 DIAGNOSIS — E1122 Type 2 diabetes mellitus with diabetic chronic kidney disease: Secondary | ICD-10-CM

## 2018-08-18 DIAGNOSIS — N2581 Secondary hyperparathyroidism of renal origin: Secondary | ICD-10-CM

## 2018-08-18 DIAGNOSIS — B351 Tinea unguium: Secondary | ICD-10-CM | POA: Diagnosis not present

## 2018-08-18 DIAGNOSIS — I129 Hypertensive chronic kidney disease with stage 1 through stage 4 chronic kidney disease, or unspecified chronic kidney disease: Secondary | ICD-10-CM | POA: Diagnosis not present

## 2018-08-18 DIAGNOSIS — Z794 Long term (current) use of insulin: Secondary | ICD-10-CM | POA: Diagnosis not present

## 2018-08-18 NOTE — Progress Notes (Signed)
Date:  08/18/2018   Name:  Aristea Posada   DOB:  09-Sep-1928   MRN:  [id number]   Chief Complaint: Diabetes (BS- 115 fasting. ); Hypertension (Blood pressure has been elevated for the past few weeks. Dr Holley Raring put her back on almodipine 5 mg daily and BP stays elevated still. BP this mornig: 179/60 ); and Wound Check (Wound on RLE opened up this morning. Seeing the wound clinic tomorrow morning.)  Diabetes  She presents for her follow-up diabetic visit. She has type 2 diabetes mellitus. Her disease course has been stable. Hypoglycemia symptoms include sweats. Pertinent negatives for hypoglycemia include no headaches or tremors. Pertinent negatives for diabetes include no chest pain, no fatigue, no polydipsia and no polyuria. Current diabetic treatment includes insulin injections. She is compliant with treatment all of the time. She monitors blood glucose at home 1-2 x per day. Her breakfast blood glucose is taken between 6-7 am. Her breakfast blood glucose range is generally 90-110 mg/dl. An ACE inhibitor/angiotensin II receptor blocker is being taken. Eye exam is current.  Hypertension  This is a chronic problem. The problem has been gradually improving since onset. The problem is controlled. Associated symptoms include sweats. Pertinent negatives include no chest pain, headaches, palpitations or shortness of breath.  Hyperlipidemia  This is a chronic problem. The problem is uncontrolled. Pertinent negatives include no chest pain or shortness of breath.  Renal insufficiency - followed by Dr. Inocente Salles.  He just added amlodipine 5 mg.  Lab Results  Component Value Date   HGBA1C 6.7 (H) 04/15/2018   Lab Results  Component Value Date   CHOL 178 04/15/2018   HDL 54 04/15/2018   LDLCALC 84 04/15/2018   TRIG 200 (H) 04/15/2018   CHOLHDL 3.3 04/15/2018   Lab Results  Component Value Date   CREATININE 1.20 (H) 04/15/2018   BUN 27 04/15/2018   NA 145 (H) 04/15/2018   K 4.2 04/15/2018   CL 110 (H) 04/15/2018   CO2 20 04/15/2018    Review of Systems  Constitutional: Negative for appetite change, fatigue, fever and unexpected weight change.  HENT: Negative for tinnitus and trouble swallowing.   Eyes: Negative for visual disturbance.  Respiratory: Negative for cough, chest tightness and shortness of breath.   Cardiovascular: Positive for leg swelling. Negative for chest pain and palpitations.  Gastrointestinal: Negative for abdominal pain.  Endocrine: Negative for polydipsia and polyuria.  Genitourinary: Negative for dysuria and hematuria.  Musculoskeletal: Negative for arthralgias.  Skin: Positive for color change and rash.  Neurological: Negative for tremors, numbness and headaches.  Psychiatric/Behavioral: Negative for dysphoric mood.    Patient Active Problem List   Diagnosis Date Noted  . Rotator cuff disorder, right 04/15/2018  . Secondary hyperparathyroidism of renal origin ([place]) 10/16/2017  . Venous stasis dermatitis of left lower extremity 07/16/2017  . Chronic congestion of paranasal sinus 03/13/2017  . Degeneration macular 05/23/2016  . Osteitis deformans 05/23/2016  . Neoplasm of uncertain behavior of skin of face 05/23/2016  . Psoriasis 05/23/2016  . Vitamin B12 deficiency 05/16/2016  . Obesity, Class III, BMI 40-49.9 (morbid obesity) ([place]) 05/02/2016  . Type 2 DM with CKD stage 4 and hypertension ([place]) 05/02/2016  . Vitamin D deficiency 05/02/2016  . Lower extremity edema 05/02/2016  . Spinal stenosis of lumbar region 05/02/2016  . Essential (primary) hypertension 06/02/2012  . Hyperlipidemia associated with type 2 diabetes mellitus ([place]) 06/02/2012  . History of breast cancer 06/14/2011  . Generalized OA 06/14/2011  .  Lymphedema of arm 06/14/2011  . OP (osteoporosis) 06/14/2011    Allergies  Allergen Reactions  . Ciprofloxacin Nausea And Vomiting  . Gluten Meal Other (See Comments)    GI distress  . Pioglitazone Other (See Comments)     Leg swelling  . Gabapentin Diarrhea, Nausea Only and Other (See Comments)    dizziness    Past Surgical History:  Procedure Laterality Date  . ABDOMINAL HYSTERECTOMY    . JOINT REPLACEMENT  2000, 2006  . MASTECTOMY Left 2010  . ORIF WRIST FRACTURE  07/2016    Social History   Tobacco Use  . Smoking status: Former Smoker    Packs/day: 0.25    Years: 2.00    Pack years: 0.50    Types: Cigarettes    Last attempt to quit: 01/02/1952    Years since quitting: 66.6  . Smokeless tobacco: Never Used  . Tobacco comment: smoking cessation materials not required  Substance Use Topics  . Alcohol use: No  . Drug use: No     Medication list has been reviewed and updated.  Current Meds  Medication Sig  . ACCU-CHEK AVIVA PLUS test strip USE AS DIRECTED THREE TIMES DAILY  . acetaminophen (TYLENOL) 325 MG tablet Take 650 mg by mouth every 6 (six) hours as needed.  Marland Kitchen amLODipine (NORVASC) 5 MG tablet Take 5 mg by mouth daily.  . Ascorbic Acid (VITAMIN C) 1000 MG tablet Take 1,000 mg by mouth daily.  . B-D UF III MINI PEN NEEDLES 31G X 5 MM MISC USE AS DIRECTED EVERY DAY  . Cholecalciferol (VITAMIN D3) 5000 units CAPS Take by mouth. Daily  . diclofenac sodium (VOLTAREN) 1 % GEL Apply 4 g topically 4 (four) times daily. To affected area as needed  . glucose blood test strip Please dispence test strips and lancets of patient's choice.  Check sugars three times daily  . LANTUS SOLOSTAR 100 UNIT/ML Solostar Pen ADMINISTER 34 UNITS UNDER THE SKIN DAILY AT 10 PM (Patient taking differently: 32-34 Units. )  . losartan (COZAAR) 50 MG tablet Take 100 mg by mouth daily.   . Multiple Vitamins-Minerals (PRESERVISION AREDS PO) Take by mouth.  . vitamin B-12 (CYANOCOBALAMIN) 1000 MCG tablet Take 1 tablet (1,000 mcg total) by mouth daily.    PHQ 2/9 Scores 08/18/2018 08/18/2018 04/15/2018 12/09/2017  PHQ - 2 Score 0 0 1 2  PHQ- 9 Score 0 - - 7    Physical Exam  Constitutional: She is oriented to person,  place, and time. She appears well-developed and well-nourished.  Eyes: Pupils are equal, round, and reactive to light.  Neck: Normal range of motion. Neck supple.  Cardiovascular: Normal rate, regular rhythm and normal heart sounds.  Pulmonary/Chest: Effort normal and breath sounds normal. She has no wheezes.  Abdominal: Soft.  Musculoskeletal: She exhibits edema and tenderness.  Lymphadenopathy:    She has no cervical adenopathy.  Neurological: She is alert and oriented to person, place, and time.  Skin: Skin is warm. Rash noted. There is erythema.  Psychiatric: She has a normal mood and affect.    BP 132/82 (BP Location: Right Arm, Patient Position: Sitting, Cuff Size: Large)   Pulse 92   Resp 16   Ht 4\' 11"  (1.499 m)   Wt 232 lb (105.2 kg)   SpO2 96%   BMI 46.86 kg/m   Assessment and Plan: 1. Type 2 DM with CKD stage 4 and hypertension ([place]) Continue current therapy for DM HTN improved Work harder on diet  -  Hemoglobin A1c - CBC with Differential/Platelet  2. Hyperlipidemia associated with type 2 diabetes mellitus ([place]) Not on medication due to age  2. Secondary hyperparathyroidism of renal origin [facility]) Followed by nephrology - Basic metabolic panel   Partially dictated using Dragon software. Any errors are unintentional.  Halina Maidens, MD [facility]  08/18/2018

## 2018-08-19 ENCOUNTER — Encounter: Payer: Medicare Other | Attending: Physician Assistant | Admitting: Physician Assistant

## 2018-08-19 DIAGNOSIS — Z833 Family history of diabetes mellitus: Secondary | ICD-10-CM | POA: Insufficient documentation

## 2018-08-19 DIAGNOSIS — Z8249 Family history of ischemic heart disease and other diseases of the circulatory system: Secondary | ICD-10-CM | POA: Insufficient documentation

## 2018-08-19 DIAGNOSIS — I120 Hypertensive chronic kidney disease with stage 5 chronic kidney disease or end stage renal disease: Secondary | ICD-10-CM | POA: Insufficient documentation

## 2018-08-19 DIAGNOSIS — I878 Other specified disorders of veins: Secondary | ICD-10-CM | POA: Diagnosis not present

## 2018-08-19 DIAGNOSIS — M069 Rheumatoid arthritis, unspecified: Secondary | ICD-10-CM | POA: Insufficient documentation

## 2018-08-19 DIAGNOSIS — N186 End stage renal disease: Secondary | ICD-10-CM | POA: Diagnosis not present

## 2018-08-19 DIAGNOSIS — Z888 Allergy status to other drugs, medicaments and biological substances status: Secondary | ICD-10-CM | POA: Diagnosis not present

## 2018-08-19 DIAGNOSIS — Z841 Family history of disorders of kidney and ureter: Secondary | ICD-10-CM | POA: Insufficient documentation

## 2018-08-19 DIAGNOSIS — E11622 Type 2 diabetes mellitus with other skin ulcer: Secondary | ICD-10-CM | POA: Insufficient documentation

## 2018-08-19 DIAGNOSIS — M109 Gout, unspecified: Secondary | ICD-10-CM | POA: Insufficient documentation

## 2018-08-19 DIAGNOSIS — I890 Lymphedema, not elsewhere classified: Secondary | ICD-10-CM | POA: Diagnosis not present

## 2018-08-19 DIAGNOSIS — Z809 Family history of malignant neoplasm, unspecified: Secondary | ICD-10-CM | POA: Diagnosis not present

## 2018-08-19 DIAGNOSIS — Z881 Allergy status to other antibiotic agents status: Secondary | ICD-10-CM | POA: Insufficient documentation

## 2018-08-19 DIAGNOSIS — Z853 Personal history of malignant neoplasm of breast: Secondary | ICD-10-CM | POA: Insufficient documentation

## 2018-08-19 DIAGNOSIS — K9041 Non-celiac gluten sensitivity: Secondary | ICD-10-CM | POA: Diagnosis not present

## 2018-08-19 DIAGNOSIS — R600 Localized edema: Secondary | ICD-10-CM | POA: Diagnosis not present

## 2018-08-19 DIAGNOSIS — E1122 Type 2 diabetes mellitus with diabetic chronic kidney disease: Secondary | ICD-10-CM | POA: Diagnosis not present

## 2018-08-19 LAB — HEMOGLOBIN A1C
ESTIMATED AVERAGE GLUCOSE: 148 mg/dL
HEMOGLOBIN A1C: 6.8 % — AB (ref 4.8–5.6)

## 2018-08-19 LAB — CBC WITH DIFFERENTIAL/PLATELET
BASOS ABS: 0 10*3/uL (ref 0.0–0.2)
Basos: 1 %
EOS (ABSOLUTE): 0.2 10*3/uL (ref 0.0–0.4)
Eos: 3 %
Hematocrit: 38.3 % (ref 34.0–46.6)
Hemoglobin: 12.7 g/dL (ref 11.1–15.9)
IMMATURE GRANS (ABS): 0 10*3/uL (ref 0.0–0.1)
IMMATURE GRANULOCYTES: 0 %
LYMPHS: 32 %
Lymphocytes Absolute: 1.6 10*3/uL (ref 0.7–3.1)
MCH: 27.5 pg (ref 26.6–33.0)
MCHC: 33.2 g/dL (ref 31.5–35.7)
MCV: 83 fL (ref 79–97)
Monocytes Absolute: 0.3 10*3/uL (ref 0.1–0.9)
Monocytes: 6 %
NEUTROS ABS: 2.9 10*3/uL (ref 1.4–7.0)
NEUTROS PCT: 58 %
PLATELETS: 173 10*3/uL (ref 150–450)
RBC: 4.61 x10E6/uL (ref 3.77–5.28)
RDW: 14.9 % (ref 12.3–15.4)
WBC: 5 10*3/uL (ref 3.4–10.8)

## 2018-08-19 LAB — BASIC METABOLIC PANEL
BUN / CREAT RATIO: 21 (ref 12–28)
BUN: 26 mg/dL (ref 8–27)
CO2: 19 mmol/L — AB (ref 20–29)
Calcium: 9.5 mg/dL (ref 8.7–10.3)
Chloride: 107 mmol/L — ABNORMAL HIGH (ref 96–106)
Creatinine, Ser: 1.26 mg/dL — ABNORMAL HIGH (ref 0.57–1.00)
GFR calc Af Amer: 44 mL/min/{1.73_m2} — ABNORMAL LOW (ref >59)
GFR calc non Af Amer: 38 mL/min/{1.73_m2} — ABNORMAL LOW (ref >59)
Glucose: 158 mg/dL — ABNORMAL HIGH (ref 65–99)
Potassium: 5 mmol/L (ref 3.5–5.2)
Sodium: 142 mmol/L (ref 134–144)

## 2018-08-20 NOTE — Progress Notes (Signed)
RIELYN, KRUPINSKI ([id number]) Visit Report for 08/19/2018 Abuse/Suicide Risk Screen Details Patient Name: Heather Long, Heather Long Date of Service: 08/19/2018 8:45 AM Medical Record Number: [id number] Patient Account Number: [id number] Date of Birth/Sex: [birth year] (84 y.o. F) Treating RN: Cornell Barman Primary Care Sameria Morss: Halina Maidens Other Clinician: Referring Harim Bi: Halina Maidens Treating Vanecia Limpert/Extender: Melburn Hake, HOYT Weeks in Treatment: 0 Abuse/Suicide Risk Screen Items Answer ABUSE/SUICIDE RISK SCREEN: Has anyone close to you tried to hurt or harm you recentlyo No Do you feel uncomfortable with anyone in your familyo No Has anyone forced you do things that you didnot want to doo No Do you have any thoughts of harming yourselfo No Patient displays signs or symptoms of abuse and/or neglect. No Electronic Signature(s) Signed: 08/19/2018 4:33:57 PM By: Gretta Cool, BSN, RN, CWS, Kim RN, BSN Entered By: Gretta Cool, BSN, RN, CWS, Kim on 08/19/2018 08:58:36 Heather Long ([id number]) -------------------------------------------------------------------------------- Activities of Daily Living Details Patient Name: Heather Long Date of Service: 08/19/2018 8:45 AM Medical Record Number: [id number] Patient Account Number: [id number] Date of Birth/Sex: 02-11-1928 (84 y.o. F) Treating RN: Cornell Barman Primary Care Tryson Lumley: Halina Maidens Other Clinician: Referring Alycia Cooperwood: Halina Maidens Treating Morris Longenecker/Extender: Melburn Hake, HOYT Weeks in Treatment: 0 Activities of Daily Living Items Answer Activities of Daily Living (Please select one for each item) Drive Automobile Completely Able Take Medications Completely Able Use Telephone Completely Able Care for Appearance Completely Able Use Toilet Completely Able Bath / Shower Completely Able Dress Self Completely Able Feed Self Completely Able Walk Completely Able Get In / Out Bed Completely Able Housework Completely Able Prepare  Meals Completely [place] for Self Completely Able Electronic Signature(s) Signed: 08/19/2018 4:33:57 PM By: Gretta Cool, BSN, RN, CWS, Kim RN, BSN Entered By: Gretta Cool, BSN, RN, CWS, Kim on 08/19/2018 08:59:19 Heather Long ([id number]) -------------------------------------------------------------------------------- Education Assessment Details Patient Name: Heather Long Date of Service: 08/19/2018 8:45 AM Medical Record Number: [id number] Patient Account Number: [id number] Date of Birth/Sex: July 23, 1928 (84 y.o. F) Treating RN: Cornell Barman Primary Care Dyane Broberg: Halina Maidens Other Clinician: Referring Makaylynn Bonillas: Halina Maidens Treating Tashya Alberty/Extender: Melburn Hake, HOYT Weeks in Treatment: 0 Primary Learner Assessed: Patient Learning Preferences/Education Level/Primary Language Learning Preference: Explanation Highest Education Level: College or Above Preferred Language: English Cognitive Barrier Assessment/Beliefs Language Barrier: No Translator Needed: No Memory Deficit: No Emotional Barrier: No Cultural/Religious Beliefs Affecting Medical Care: No Physical Barrier Assessment Impaired Vision: Yes Glasses Impaired Hearing: No Decreased Hand dexterity: No Knowledge/Comprehension Assessment Knowledge Level: High Comprehension Level: High Ability to understand written High instructions: Ability to understand verbal High instructions: Motivation Assessment Anxiety Level: Calm Cooperation: Cooperative Education Importance: Acknowledges Need Interest in Health Problems: Asks Questions Perception: Coherent Willingness to Engage in Self- High Management Activities: Engineer, maintenance) Signed: 08/19/2018 4:33:57 PM By: Gretta Cool, BSN, RN, CWS, Kim RN, BSN Entered By: Gretta Cool, BSN, RN, CWS, Kim on 08/19/2018 08:59:57 Heather Long ([id number]) -------------------------------------------------------------------------------- Fall Risk  Assessment Details Patient Name: Heather Long Date of Service: 08/19/2018 8:45 AM Medical Record Number: [id number] Patient Account Number: [id number] Date of Birth/Sex: May 06, 1928 (84 y.o. F) Treating RN: Cornell Barman Primary Care Sharonda Llamas: Halina Maidens Other Clinician: Referring Tyrece Vanterpool: Halina Maidens Treating Kalecia Hartney/Extender: Melburn Hake, HOYT Weeks in Treatment: 0 Fall Risk Assessment Items Have you had 2 or more falls in the last 12 monthso 0 No Have you had any fall that resulted in injury in the last 12 monthso 0 No FALL RISK ASSESSMENT: History of falling - immediate or within 3 months 0 No Secondary diagnosis 0 No Ambulatory aid  None/bed rest/wheelchair/nurse 0 Yes Crutches/cane/walker 0 No Furniture 0 No IV Access/Saline Lock 0 No Gait/Training Normal/bed rest/immobile 0 Yes Weak 0 No Impaired 0 No Mental Status Oriented to own ability 0 Yes Electronic Signature(s) Signed: 08/19/2018 4:33:57 PM By: Gretta Cool, BSN, RN, CWS, Kim RN, BSN Entered By: Gretta Cool, BSN, RN, CWS, Kim on 08/19/2018 09:00:09 Heather Long ([id number]) -------------------------------------------------------------------------------- Foot Assessment Details Patient Name: Heather Long Date of Service: 08/19/2018 8:45 AM Medical Record Number: [id number] Patient Account Number: [id number] Date of Birth/Sex: 01-16-1928 (84 y.o. F) Treating RN: Cornell Barman Primary Care Ridwan Bondy: Halina Maidens Other Clinician: Referring Dante Cooter: Halina Maidens Treating Aunika Kirsten/Extender: Melburn Hake, HOYT Weeks in Treatment: 0 Foot Assessment Items Site Locations + = Sensation present, - = Sensation absent, C = Callus, U = Ulcer R = Redness, W = Warmth, M = Maceration, PU = Pre-ulcerative lesion F = Fissure, S = Swelling, D = Dryness Assessment Right: Left: Other Deformity: No No Prior Foot Ulcer: No No Prior Amputation: No No Charcot Joint: No No Ambulatory Status: Ambulatory Without Help Gait:  Steady Electronic Signature(s) Signed: 08/19/2018 4:33:57 PM By: Gretta Cool, BSN, RN, CWS, Kim RN, BSN Entered By: Gretta Cool, BSN, RN, CWS, Kim on 08/19/2018 09:01:06 Heather Long ([id number]) -------------------------------------------------------------------------------- Nutrition Risk Assessment Details Patient Name: Heather Long Date of Service: 08/19/2018 8:45 AM Medical Record Number: [id number] Patient Account Number: [id number] Date of Birth/Sex: [birth year] (84 y.o. F) Treating RN: Cornell Barman Primary Care Ary Rudnick: Halina Maidens Other Clinician: Referring Derelle Cockrell: Halina Maidens Treating Olivia Royse/Extender: Melburn Hake, HOYT Weeks in Treatment: 0 Height (in): 59 Weight (lbs): 232.8 Body Mass Index (BMI): 47 Nutrition Risk Assessment Items NUTRITION RISK SCREEN: I have an illness or condition that made me change the kind and/or amount of 0 No food I eat I eat fewer than two meals per day 0 No I eat few fruits and vegetables, or milk products 0 No I have three or more drinks of beer, liquor or wine almost every day 0 No I have tooth or mouth problems that make it hard for me to eat 0 No I don't always have enough money to buy the food I need 0 No I eat alone most of the time 0 No I take three or more different prescribed or over-the-counter drugs a day 0 No Without wanting to, I have lost or gained 10 pounds in the last six months 0 No I am not always physically able to shop, cook and/or feed myself 0 No Nutrition Protocols Good Risk Protocol 0 No interventions needed Moderate Risk Protocol Electronic Signature(s) Signed: 08/19/2018 4:33:57 PM By: Gretta Cool, BSN, RN, CWS, Kim RN, BSN Entered By: Gretta Cool, BSN, RN, CWS, Kim on 08/19/2018 09:00:34

## 2018-08-20 NOTE — Progress Notes (Signed)
KISCHA, ALTICE ([id number]) Visit Report for 08/19/2018 Chief Complaint Document Details Patient Name: Heather Long, GREGG Date of Service: 08/19/2018 8:45 AM Medical Record Number: [id number] Patient Account Number: [id number] Date of Birth/Sex: [birth year] (84 y.o. Female) Treating RN: Montey Hora Primary Care Provider: Halina Maidens Other Clinician: Referring Provider: Halina Maidens Treating Provider/Extender: Melburn Hake, HOYT Weeks in Treatment: 0 Information Obtained from: Patient Chief Complaint Bilateral LE Lymphedema Electronic Signature(s) Signed: 08/19/2018 9:56:08 AM By: Worthy Keeler PA-C Entered By: Worthy Keeler on 08/19/2018 09:11:42 Heather Long ([id number]) -------------------------------------------------------------------------------- HPI Details Patient Name: Heather Long Date of Service: 08/19/2018 8:45 AM Medical Record Number: [id number] Patient Account Number: [id number] Date of Birth/Sex: 13-Jan-1928 (84 y.o. Female) Treating RN: Montey Hora Primary Care Provider: Halina Maidens Other Clinician: Referring Provider: Halina Maidens Treating Provider/Extender: Melburn Hake, HOYT Weeks in Treatment: 0 History of Present Illness HPI Description: 08/19/18 on evaluation today patient presents for initial evaluation and clinic concerning issues that she has been having with her bilateral lower extremities with intermittent weeping. She does have a history of lymphedema, diabetes mellitus type II, and hypertension. She also has a history of breast cancer which has previously been treated. She also has left upper extremity lymphedema. Nonetheless on evaluation today there really are no open ulcers noted at this point. She has been utilizing no specific treatments currently and states that no one has really ever talk to her about compression stockings in general. In fact she was not really aware that she had lymphedema of the bilateral lower extremities  although she does have the skin changes noted around the base of her toes as well as in the interior portion of her shin as well. She's also very tender to touch as far as the legs are concerned there's no cellulitis but she does have a lot of fluid which I think is making her legs much more tender as well. Unfortunately due to the pain we were unable to obtain ABI's today. The patient appears to be somewhat of a poor historian telling me that she thinks she may have seen vascular in the past although it would've been "years ago". She also has a feeling that she's had arterial studies performed at orthopedics recently although will be look through the epic, care everywhere, system I was not able to find any vascular testing that was within the past 2-3 years. Electronic Signature(s) Signed: 08/19/2018 9:56:08 AM By: Worthy Keeler PA-C Entered By: Worthy Keeler on 08/19/2018 09:51:52 Heather Long ([id number]) -------------------------------------------------------------------------------- Physical Exam Details Patient Name: Heather Long Date of Service: 08/19/2018 8:45 AM Medical Record Number: [id number] Patient Account Number: [id number] Date of Birth/Sex: 1928/07/05 (84 y.o. Female) Treating RN: Montey Hora Primary Care Provider: Halina Maidens Other Clinician: Referring Provider: Halina Maidens Treating Provider/Extender: STONE III, HOYT Weeks in Treatment: 0 Constitutional patient is hypertensive.. pulse regular and within target range for patient.Marland Kitchen respirations regular, non-labored and within target range for patient.Marland Kitchen temperature within target range for patient.. Well-nourished and well-hydrated in no acute distress. Eyes conjunctiva clear no eyelid edema noted. pupils equal round and reactive to light and accommodation. Ears, Nose, Mouth, and Throat no gross abnormality of ear auricles or external auditory canals. normal hearing noted during conversation.  mucus membranes moist. Respiratory normal breathing without difficulty. clear to auscultation bilaterally. Cardiovascular 2+ dorsalis pedis/posterior tibialis pulses. 2+ pitting edema of the bilateral lower extremities. Gastrointestinal (GI) soft, non-tender, non-distended, +BS. no ventral hernia noted. Musculoskeletal unsteady while walking. Prior bilateral Knee replcements. Psychiatric this patient is  able to make decisions and demonstrates good insight into disease process. Alert and Oriented x 3. pleasant and cooperative. Notes On evaluation today patient does have bilateral lower extremity lymphedema. She does not appear to have any evidence of cellulitis at this point there's also no evidence of systemic infection at this time which is good news. With that being said I do believe that her pulses seemed good and although she has edema I don't believe that she has any evidence of imminent breakdown. Electronic Signature(s) Signed: 08/19/2018 9:56:08 AM By: Worthy Keeler PA-C Entered By: Worthy Keeler on 08/19/2018 09:53:37 Heather Long ([id number]) -------------------------------------------------------------------------------- Physician Orders Details Patient Name: Heather Long Date of Service: 08/19/2018 8:45 AM Medical Record Number: [id number] Patient Account Number: [id number] Date of Birth/Sex: July 26, 1928 (84 y.o. Female) Treating RN: Montey Hora Primary Care Provider: Halina Maidens Other Clinician: Referring Provider: Halina Maidens Treating Provider/Extender: Melburn Hake, HOYT Weeks in Treatment: 0 Verbal / Phone Orders: No Diagnosis Coding ICD-10 Coding Code Description I89.0 Lymphedema, not elsewhere classified E11.622 Type 2 diabetes mellitus with other skin ulcer I10 Essential (primary) hypertension Edema Control o Other: - tubigrip to both lower legs Discharge From [facility] Services o Discharge from [place] o Vascular -  R/T lymphedema/venous stasis Services and Therapies o Arterial Studies- Bilateral - at [place] Signature(s) Signed: 08/19/2018 9:56:08 AM By: Worthy Keeler PA-C Signed: 08/19/2018 5:00:45 PM By: Montey Hora Entered By: Montey Hora on 08/19/2018 09:29:53 Heather Long ([id number]) -------------------------------------------------------------------------------- Problem List Details Patient Name: Heather Long Date of Service: 08/19/2018 8:45 AM Medical Record Number: [id number] Patient Account Number: [id number] Date of Birth/Sex: 1928-05-31 (84 y.o. Female) Treating RN: Montey Hora Primary Care Provider: Halina Maidens Other Clinician: Referring Provider: Halina Maidens Treating Provider/Extender: Melburn Hake, HOYT Weeks in Treatment: 0 Active Problems ICD-10 Evaluated Encounter Code Description Active Date Today Diagnosis I89.0 Lymphedema, not elsewhere classified 08/19/2018 No Yes E11.622 Type 2 diabetes mellitus with other skin ulcer 08/19/2018 No Yes I10 Essential (primary) hypertension 08/19/2018 No Yes Inactive Problems Resolved Problems Electronic Signature(s) Signed: 08/19/2018 9:56:08 AM By: Worthy Keeler PA-C Entered By: Worthy Keeler on 08/19/2018 09:11:30 Heather Long ([id number]) -------------------------------------------------------------------------------- Progress Note Details Patient Name: Heather Long Date of Service: 08/19/2018 8:45 AM Medical Record Number: [id number] Patient Account Number: [id number] Date of Birth/Sex: March 03, 1928 (84 y.o. Female) Treating RN: Montey Hora Primary Care Provider: Halina Maidens Other Clinician: Referring Provider: Halina Maidens Treating Provider/Extender: Melburn Hake, HOYT Weeks in Treatment: 0 Subjective Chief Complaint Information obtained from Patient Bilateral LE Lymphedema History of Present Illness (HPI) 08/19/18 on evaluation today patient presents for initial evaluation  and clinic concerning issues that she has been having with her bilateral lower extremities with intermittent weeping. She does have a history of lymphedema, diabetes mellitus type II, and hypertension. She also has a history of breast cancer which has previously been treated. She also has left upper extremity lymphedema. Nonetheless on evaluation today there really are no open ulcers noted at this point. She has been utilizing no specific treatments currently and states that no one has really ever talk to her about compression stockings in general. In fact she was not really aware that she had lymphedema of the bilateral lower extremities although she does have the skin changes noted around the base of her toes as well as in the interior portion of her shin as well. She's also very tender to touch as far as the legs are concerned there's no cellulitis but she does have  a lot of fluid which I think is making her legs much more tender as well. Unfortunately due to the pain we were unable to obtain ABI's today. The patient appears to be somewhat of a poor historian telling me that she thinks she may have seen vascular in the past although it would've been "years ago". She also has a feeling that she's had arterial studies performed at orthopedics recently although will be look through the epic, care everywhere, system I was not able to find any vascular testing that was within the past 2-3 years. Wound History Patient reportedly has not tested positive for osteomyelitis. Patient reportedly has not had testing performed to evaluate circulation in the legs. Patient History Information obtained from Patient. Allergies ciprofloxacin, gluten, gabapentin, pioglitazone Family History Cancer - Siblings,Mother, Diabetes - Mother,Siblings, Heart Disease - Father,Siblings, Kidney Disease - Siblings, Lung Disease - Siblings, Tuberculosis - Siblings, No family history of Hypertension, Seizures, Stroke, Thyroid  Problems. Social History Never smoker, Marital Status - Divorced, Alcohol Use - Never, Drug Use - No History, Caffeine Use - Daily. Medical History Eyes Patient has history of Cataracts Denies history of Glaucoma, Optic Neuritis Ear/Nose/Mouth/Throat Denies history of Chronic sinus problems/congestion, Middle ear problems Hematologic/Lymphatic Patient has history of Lymphedema GLYNDA, SOLIDAY ([id number]) Denies history of Anemia, Hemophilia, Human Immunodeficiency Virus, Sickle Cell Disease Respiratory Denies history of Aspiration, Asthma, Chronic Obstructive Pulmonary Disease (COPD), Pneumothorax, Sleep Apnea, Tuberculosis Cardiovascular Patient has history of Hypertension Denies history of Angina, Arrhythmia, Congestive Heart Failure, Coronary Artery Disease, Deep Vein Thrombosis, Hypotension, Myocardial Infarction, Peripheral Arterial Disease, Peripheral Venous Disease, Phlebitis, Vasculitis Gastrointestinal Denies history of Cirrhosis , Colitis, Crohn s, Hepatitis A, Hepatitis B, Hepatitis C Endocrine Patient has history of Type II Diabetes Denies history of Type I Diabetes Genitourinary Patient has history of End Stage Renal Disease - stage 4 Immunological Denies history of Lupus Erythematosus, Raynaud s, Scleroderma Integumentary (Skin) Denies history of History of Burn, History of pressure wounds Musculoskeletal Patient has history of Gout, Rheumatoid Arthritis Denies history of Osteoarthritis, Osteomyelitis Neurologic Denies history of Dementia, Neuropathy, Quadriplegia, Paraplegia, Seizure Disorder Oncologic Denies history of Received Chemotherapy, Received Radiation Psychiatric Denies history of Anorexia/bulimia, Confinement Anxiety Patient is treated with Insulin. Blood sugar is tested. Blood sugar results noted at the following times: Breakfast - 104. Medical And Surgical History Notes Ear/Nose/Mouth/Throat bilateral hearing aides Review of Systems  (ROS) Eyes Complains or has symptoms of Glasses / Contacts - glasses. Denies complaints or symptoms of Dry Eyes, Vision Changes. Ear/Nose/Mouth/Throat Denies complaints or symptoms of Difficult clearing ears, Sinusitis. Hematologic/Lymphatic Denies complaints or symptoms of Bleeding / Clotting Disorders, Human Immunodeficiency Virus. Respiratory Complains or has symptoms of Shortness of Breath - with exertion. Cardiovascular Complains or has symptoms of LE edema. Denies complaints or symptoms of Chest pain. Gastrointestinal Denies complaints or symptoms of Frequent diarrhea, Nausea, Vomiting. Endocrine Denies complaints or symptoms of Hepatitis, Thyroid disease, Polydypsia (Excessive Thirst). Immunological Denies complaints or symptoms of Hives, Itching. Integumentary (Skin) Complains or has symptoms of Swelling. Denies complaints or symptoms of Wounds, Bleeding or bruising tendency, Breakdown. Musculoskeletal Complains or has symptoms of Muscle Pain, Muscle Weakness. Neurologic KYRIANA, YANKEE ([id number]) Complains or has symptoms of Numbness/parasthesias. Denies complaints or symptoms of Focal/Weakness. Oncologic breast removed 5 years ago left breast Psychiatric Complains or has symptoms of Anxiety. Denies complaints or symptoms of Claustrophobia. Objective Constitutional patient is hypertensive.. pulse regular and within target range for patient.Marland Kitchen respirations regular, non-labored and within target range for patient.Marland Kitchen temperature within target  range for patient.. Well-nourished and well-hydrated in no acute distress. Vitals Time Taken: 9:38 AM, Height: 59 in, Weight: 232.8 lbs, Source: Measured, BMI: 47, Temperature: 97.8 F, Pulse: 76 bpm, Respiratory Rate: 18 breaths/min, Blood Pressure: 163/54 mmHg. Eyes conjunctiva clear no eyelid edema noted. pupils equal round and reactive to light and accommodation. Ears, Nose, Mouth, and Throat no gross abnormality of ear  auricles or external auditory canals. normal hearing noted during conversation. mucus membranes moist. Respiratory normal breathing without difficulty. clear to auscultation bilaterally. Cardiovascular 2+ dorsalis pedis/posterior tibialis pulses. 2+ pitting edema of the bilateral lower extremities. Gastrointestinal (GI) soft, non-tender, non-distended, +BS. no ventral hernia noted. Musculoskeletal unsteady while walking. Prior bilateral Knee replcements. Psychiatric this patient is able to make decisions and demonstrates good insight into disease process. Alert and Oriented x 3. pleasant and cooperative. General Notes: On evaluation today patient does have bilateral lower extremity lymphedema. She does not appear to have any evidence of cellulitis at this point there's also no evidence of systemic infection at this time which is good news. With that being said I do believe that her pulses seemed good and although she has edema I don't believe that she has any evidence of imminent breakdown. Other Condition(s) Patient presents with Lymphedema located on the Bilateral Leg. General Notes: patient has blistered areas on left lower shin area non open. no redness noted only edema. 4+ pitting KINLEY, DOZIER ([id number]) Assessment Active Problems ICD-10 Lymphedema, not elsewhere classified Type 2 diabetes mellitus with other skin ulcer Essential (primary) hypertension Plan Edema Control: Other: - tubigrip to both lower legs Discharge From [facility]: Discharge from [place] and Therapies ordered were: Arterial Studies- Bilateral - at [facility] Consults ordered were: Vascular - R/T lymphedema/venous stasis My suggestion at this point is that she honestly really has no ulcers and so there's nothing for me wound care perspective but I can do. I contemplated sending her to lymphedema clinic but I think the ideal thing may be for [place] to go ahead and see about sending her to  vascular I will go ahead and order the arterial studies at [facility] and that way they will have that prior to her initial evaluation. Subsequently I think that she may need further venous testing potentially initiating compression wraps following the arterial studies and potentially also lymphedema pumps. Nonetheless I think that vascular would be the best place for her at this point. We will subsequently see her back as she develops in the ulcerations or wounds that need further care otherwise it's a pleasure meeting the patient today and I wish her the best I did explain that I do think she needs to have some kind of compression once we get everything determined as far as the arterial status is concerned. We will see her back as needed. Electronic Signature(s) Signed: 08/19/2018 9:56:08 AM By: Worthy Keeler PA-C Entered By: Worthy Keeler on 08/19/2018 09:55:07 Heather Long ([id number]) -------------------------------------------------------------------------------- ROS/PFSH Details Patient Name: Heather Long Date of Service: 08/19/2018 8:45 AM Medical Record Number: [id number] Patient Account Number: [id number] Date of Birth/Sex: 1928/09/24 (84 y.o. Female) Treating RN: Cornell Barman Primary Care Provider: Halina Maidens Other Clinician: Referring Provider: Halina Maidens Treating Provider/Extender: Melburn Hake, HOYT Weeks in Treatment: 0 Information Obtained From Patient Wound History Do you currently have one or more open woundso No Have you tested positive for osteomyelitis (bone infection)o No Have you had any tests for circulation on your legso No Eyes Complaints and Symptoms: Positive for: Glasses /  Contacts - glasses Negative for: Dry Eyes; Vision Changes Medical History: Positive for: Cataracts Negative for: Glaucoma; Optic Neuritis Ear/Nose/Mouth/Throat Complaints and Symptoms: Negative for: Difficult clearing ears; Sinusitis Medical History: Negative for:  Chronic sinus problems/congestion; Middle ear problems Past Medical History Notes: bilateral hearing aides Hematologic/Lymphatic Complaints and Symptoms: Negative for: Bleeding / Clotting Disorders; Human Immunodeficiency Virus Medical History: Positive for: Lymphedema Negative for: Anemia; Hemophilia; Human Immunodeficiency Virus; Sickle Cell Disease Respiratory Complaints and Symptoms: Positive for: Shortness of Breath - with exertion Medical History: Negative for: Aspiration; Asthma; Chronic Obstructive Pulmonary Disease (COPD); Pneumothorax; Sleep Apnea; Tuberculosis Cardiovascular Complaints and Symptoms: Positive for: LE edema Negative for: Chest pain MARSELA, KUAN ([id number]) Medical History: Positive for: Hypertension Negative for: Angina; Arrhythmia; Congestive Heart Failure; Coronary Artery Disease; Deep Vein Thrombosis; Hypotension; Myocardial Infarction; Peripheral Arterial Disease; Peripheral Venous Disease; Phlebitis; Vasculitis Gastrointestinal Complaints and Symptoms: Negative for: Frequent diarrhea; Nausea; Vomiting Medical History: Negative for: Cirrhosis ; Colitis; Crohnos; Hepatitis A; Hepatitis B; Hepatitis C Endocrine Complaints and Symptoms: Negative for: Hepatitis; Thyroid disease; Polydypsia (Excessive Thirst) Medical History: Positive for: Type II Diabetes Negative for: Type I Diabetes Time with diabetes: 60 years Treated with: Insulin Blood sugar tested every day: Yes Tested : once daily Blood sugar testing results: Breakfast: 104 Immunological Complaints and Symptoms: Negative for: Hives; Itching Medical History: Negative for: Lupus Erythematosus; Raynaudos; Scleroderma Integumentary (Skin) Complaints and Symptoms: Positive for: Swelling Negative for: Wounds; Bleeding or bruising tendency; Breakdown Medical History: Negative for: History of Burn; History of pressure wounds Musculoskeletal Complaints and Symptoms: Positive for:  Muscle Pain; Muscle Weakness Medical History: Positive for: Gout; Rheumatoid Arthritis Negative for: Osteoarthritis; Osteomyelitis Neurologic Complaints and Symptoms: Positive for: Numbness/parasthesias Negative for: JESSCIA, IMM ([id number]) Medical History: Negative for: Dementia; Neuropathy; Quadriplegia; Paraplegia; Seizure Disorder Psychiatric Complaints and Symptoms: Positive for: Anxiety Negative for: Claustrophobia Medical History: Negative for: Anorexia/bulimia; Confinement Anxiety Genitourinary Medical History: Positive for: End Stage Renal Disease - stage 4 Oncologic Complaints and Symptoms: Review of System Notes: breast removed 5 years ago left breast Medical History: Negative for: Received Chemotherapy; Received Radiation HBO Extended History Items Eyes: Cataracts Immunizations Pneumococcal Vaccine: Received Pneumococcal Vaccination: Yes Tetanus Vaccine: Last tetanus shot: 08/19/2016 Implantable Devices Family and Social History Cancer: Yes - Siblings,Mother; Diabetes: Yes - Mother,Siblings; Heart Disease: Yes - Father,Siblings; Hypertension: No; Kidney Disease: Yes - Siblings; Lung Disease: Yes - Siblings; Seizures: No; Stroke: No; Thyroid Problems: No; Tuberculosis: Yes - Siblings; Never smoker; Marital Status - Divorced; Alcohol Use: Never; Drug Use: No History; Caffeine Use: Daily; Financial Concerns: No; Food, Clothing or Shelter Needs: No; Support System Lacking: No; Transportation Concerns: No; Advanced Directives: Yes; Living Will: Yes; Medical Power of Attorney: No Electronic Signature(s) Signed: 08/19/2018 9:56:08 AM By: Worthy Keeler PA-C Signed: 08/19/2018 4:33:57 PM By: Gretta Cool, BSN, RN, CWS, Kim RN, BSN Entered By: Gretta Cool, BSN, RN, CWS, Kim on 08/19/2018 08:58:20 Heather Long ([id number]) -------------------------------------------------------------------------------- SuperBill Details Patient Name: Heather Long Date of Service: 08/19/2018 Medical Record Number: [id number] Patient Account Number: [id number] Date of Birth/Sex: 02-Nov-1928 (84 y.o. Female) Treating RN: Montey Hora Primary Care Provider: Halina Maidens Other Clinician: Referring Provider: Halina Maidens Treating Provider/Extender: Melburn Hake, HOYT Weeks in Treatment: 0 Diagnosis Coding ICD-10 Codes Code Description I89.0 Lymphedema, not elsewhere classified E11.622 Type 2 diabetes mellitus with other skin ulcer I10 Essential (primary) hypertension Facility Procedures CPT4 Code: [id number] Description: 99213 - WOUND CARE VISIT-LEV 3 EST PT Modifier: Quantity: 1 Physician Procedures CPT4 Code: [id number] Description: 57017 - WC PHYS  LEVEL 4 - NEW PT ICD-10 Diagnosis Description I89.0 Lymphedema, not elsewhere classified E11.622 Type 2 diabetes mellitus with other skin ulcer I10 Essential (primary) hypertension Modifier: Quantity: 1 Electronic Signature(s) Signed: 08/19/2018 9:56:08 AM By: Worthy Keeler PA-C Entered By: Worthy Keeler on 08/19/2018 09:55:23

## 2018-08-20 NOTE — Progress Notes (Signed)
Heather Long, Heather Long ([id number]) Visit Report for 08/19/2018 Allergy List Details Patient Name: Heather Long, Heather Long Date of Service: 08/19/2018 8:45 AM Medical Record Number: [id number] Patient Account Number: [id number] Date of Birth/Sex: Oct 15, 1928 (84 y.o. Female) Treating RN: Cornell Barman Primary Care Montel Vanderhoof: Halina Maidens Other Clinician: Referring Nikai Quest: Halina Maidens Treating Clotilde Loth/Extender: Melburn Hake, HOYT Weeks in Treatment: 0 Allergies Active Allergies ciprofloxacin gluten gabapentin pioglitazone Allergy Notes Electronic Signature(s) Signed: 08/19/2018 4:33:57 PM By: Gretta Cool, BSN, RN, CWS, Kim RN, BSN Entered By: Gretta Cool, BSN, RN, CWS, Kim on 08/19/2018 09:14:00 Heather Long ([id number]) -------------------------------------------------------------------------------- Arrival Information Details Patient Name: Heather Long Date of Service: 08/19/2018 8:45 AM Medical Record Number: [id number] Patient Account Number: [id number] Date of Birth/Sex: 1928-10-31 (84 y.o. Female) Treating RN: Montey Hora Primary Care Zuleica Seith: Halina Maidens Other Clinician: Referring Mollie Rossano: Halina Maidens Treating Willia Lampert/Extender: Melburn Hake, HOYT Weeks in Treatment: 0 Visit Information Patient Arrived: Heather Long Arrival Time: 08:36 Accompanied By: self Transfer Assistance: None Patient Identification Verified: Yes Secondary Verification Process Completed: Yes Electronic Signature(s) Signed: 08/19/2018 8:47:37 AM By: Lorine Bears RCP, RRT, CHT Entered By: Lorine Bears on 08/19/2018 08:36:38 Heather Long ([id number]) -------------------------------------------------------------------------------- Clinic Level of Care Assessment Details Patient Name: Heather Long Date of Service: 08/19/2018 8:45 AM Medical Record Number: [id number] Patient Account Number: [id number] Date of Birth/Sex: June 16, 1928 (84 y.o. Female) Treating RN: Montey Hora Primary Care Kaziyah Parkison: Halina Maidens Other Clinician: Referring Denyce Harr: Halina Maidens Treating Anna Livers/Extender: Melburn Hake, HOYT Weeks in Treatment: 0 Clinic Level of Care Assessment Items TOOL 2 Quantity Score []  - Use when only an EandM is performed on the INITIAL visit 0 ASSESSMENTS - Nursing Assessment / Reassessment X - General Physical Exam (combine w/ comprehensive assessment (listed just below) when 1 20 performed on new pt. evals) X- 1 25 Comprehensive Assessment (HX, ROS, Risk Assessments, Wounds Hx, etc.) ASSESSMENTS - Wound and Skin Assessment / Reassessment []  - Simple Wound Assessment / Reassessment - one wound 0 []  - 0 Complex Wound Assessment / Reassessment - multiple wounds X- 1 10 Dermatologic / Skin Assessment (not related to wound area) ASSESSMENTS - Ostomy and/or Continence Assessment and Care []  - Incontinence Assessment and Management 0 []  - 0 Ostomy Care Assessment and Management (repouching, etc.) PROCESS - Coordination of Care X - Simple Patient / Family Education for ongoing care 1 15 []  - 0 Complex (extensive) Patient / Family Education for ongoing care []  - 0 Staff obtains Programmer, systems, Records, Test Results / Process Orders []  - 0 Staff telephones HHA, Nursing Homes / Clarify orders / etc []  - 0 Routine Transfer to another Facility (non-emergent condition) []  - 0 Routine Hospital Admission (non-emergent condition) X- 1 15 New Admissions / Biomedical engineer / Ordering NPWT, Apligraf, etc. []  - 0 Emergency Hospital Admission (emergent condition) X- 1 10 Simple Discharge Coordination []  - 0 Complex (extensive) Discharge Coordination PROCESS - Special Needs []  - Pediatric / Minor Patient Management 0 []  - 0 Isolation Patient Management NAYLEA, Heather Long ([id number]) []  - 0 Hearing / Language / Visual special needs []  - 0 Assessment of Community assistance (transportation, D/C planning, etc.) []  - 0 Additional assistance  / Altered mentation []  - 0 Support Surface(s) Assessment (bed, cushion, seat, etc.) INTERVENTIONS - Wound Cleansing / Measurement []  - Wound Imaging (photographs - any number of wounds) 0 []  - 0 Wound Tracing (instead of photographs) []  - 0 Simple Wound Measurement - one wound []  - 0 Complex Wound Measurement - multiple wounds []  - 0 Simple Wound Cleansing - one wound []  -  0 Complex Wound Cleansing - multiple wounds INTERVENTIONS - Wound Dressings []  - Small Wound Dressing one or multiple wounds 0 []  - 0 Medium Wound Dressing one or multiple wounds []  - 0 Large Wound Dressing one or multiple wounds []  - 0 Application of Medications - injection INTERVENTIONS - Miscellaneous []  - External ear exam 0 []  - 0 Specimen Collection (cultures, biopsies, blood, body fluids, etc.) []  - 0 Specimen(s) / Culture(s) sent or taken to Lab for analysis []  - 0 Patient Transfer (multiple staff / Civil Service fast streamer / Similar devices) []  - 0 Simple Staple / Suture removal (25 or less) []  - 0 Complex Staple / Suture removal (26 or more) []  - 0 Hypo / Hyperglycemic Management (close monitor of Blood Glucose) []  - 0 Ankle / Brachial Index (ABI) - do not check if billed separately Has the patient been seen at the hospital within the last three years: Yes Total Score: 95 Level Of Care: New/Established - Level 3 Electronic Signature(s) Signed: 08/19/2018 5:00:45 PM By: Montey Hora Entered By: Montey Hora on 08/19/2018 09:34:26 Heather Long ([id number]) -------------------------------------------------------------------------------- Lower Extremity Assessment Details Patient Name: Heather Long Date of Service: 08/19/2018 8:45 AM Medical Record Number: [id number] Patient Account Number: [id number] Date of Birth/Sex: 04-01-1928 (84 y.o. Female) Treating RN: Cornell Barman Primary Care Lenita Peregrina: Halina Maidens Other Clinician: Referring Neeya Prigmore: Halina Maidens Treating Diva Lemberger/Extender:  Melburn Hake, HOYT Weeks in Treatment: 0 Edema Assessment Assessed: [Left: No] [Right: No] [Left: Edema] [Right: :] Calf Left: Right: Point of Measurement: 29 cm From Medial Instep 47.5 cm 48 cm Ankle Left: Right: Point of Measurement: 10 cm From Medial Instep 31 cm 48 cm Vascular Assessment Claudication: Claudication Assessment [Left:None] [Right:None] Pulses: Dorsalis Pedis Palpable: [Left:Yes] [Right:Yes] Posterior Tibial Extremity colors, hair growth, and conditions: Extremity Color: [Left:Normal] [Right:Normal] Hair Growth on Extremity: [Left:No] [Right:No] Temperature of Extremity: [Left:Warm] [Right:Warm] Capillary Refill: [Left:< 3 seconds] [Right:< 3 seconds] Toe Nail Assessment Left: Right: Thick: No No Discolored: No No Deformed: No No Improper Length and Hygiene: No No Electronic Signature(s) Signed: 08/19/2018 4:33:57 PM By: Gretta Cool, BSN, RN, CWS, Kim RN, BSN Entered By: Gretta Cool, BSN, RN, CWS, Kim on 08/19/2018 09:05:13 Heather Long ([id number]) -------------------------------------------------------------------------------- Non-Wound Condition Assessment Details Patient Name: Heather Long Date of Service: 08/19/2018 8:45 AM Medical Record Number: [id number] Patient Account Number: [id number] Date of Birth/Sex: 05-Oct-1928 (84 y.o. Female) Treating RN: Cornell Barman Primary Care Edy Mcbane: Halina Maidens Other Clinician: Referring Trinette Vera: Halina Maidens Treating Necola Bluestein/Extender: Joaquim Lai III, HOYT Weeks in Treatment: 0 Non-Wound Condition: Condition: Lymphedema Location: Leg Side: Bilateral Photos Periwound Skin Texture Texture Color No Abnormalities Noted: No No Abnormalities Noted: No Moisture No Abnormalities Noted: No Notes patient has blistered areas on left lower shin area non open. no redness noted only edema. 4+ pitting Electronic Signature(s) Signed: 08/19/2018 9:20:56 AM By: Gretta Cool, BSN, RN, CWS, Kim RN, BSN Entered By: Gretta Cool, BSN, RN,  CWS, Kim on 08/19/2018 09:20:56 Heather Long ([id number]) -------------------------------------------------------------------------------- Pain Assessment Details Patient Name: Heather Long Date of Service: 08/19/2018 8:45 AM Medical Record Number: [id number] Patient Account Number: [id number] Date of Birth/Sex: December 05, 1927 (84 y.o. Female) Treating RN: Montey Hora Primary Care Jamel Holzmann: Halina Maidens Other Clinician: Referring Zakia Sainato: Halina Maidens Treating Amritha Yorke/Extender: Melburn Hake, HOYT Weeks in Treatment: 0 Active Problems Location of Pain Severity and Description of Pain Patient Has Paino Yes Site Locations Rate the pain. Current Pain Level: 1 Pain Management and Medication Current Pain Management: Electronic Signature(s) Signed: 08/19/2018 8:47:37 AM By: Lorine Bears RCP, RRT, CHT Signed: 08/19/2018  5:00:45 PM By: Montey Hora Entered By: Lorine Bears on 08/19/2018 08:37:36 Heather Long ([id number]) -------------------------------------------------------------------------------- Patient/Caregiver Education Details Patient Name: Heather Long Date of Service: 08/19/2018 8:45 AM Medical Record Number: [id number] Patient Account Number: [id number] Date of Birth/Gender: 1928/01/20 (84 y.o. Female) Treating RN: Montey Hora Primary Care Physician: Halina Maidens Other Clinician: Referring Physician: Halina Maidens Treating Physician/Extender: Melburn Hake, HOYT Weeks in Treatment: 0 Education Assessment Education Provided To: Patient Education Topics Provided Venous: Handouts: Other: need for compression and care for lymphedema and venous stasis Methods: Explain/Verbal Responses: State content correctly Electronic Signature(s) Signed: 08/19/2018 5:00:45 PM By: Montey Hora Entered By: Montey Hora on 08/19/2018 09:35:06 Heather Long  ([id number]) -------------------------------------------------------------------------------- [place] Details Patient Name: Heather Long Date of Service: 08/19/2018 8:45 AM Medical Record Number: [id number] Patient Account Number: [id number] Date of Birth/Sex: 09/02/1928 (84 y.o. Female) Treating RN: Montey Hora Primary Care Carmina Walle: Halina Maidens Other Clinician: Referring Saylah Ketner: Halina Maidens Treating Karson Chicas/Extender: Melburn Hake, HOYT Weeks in Treatment: 0 Vital Signs Time Taken: 09:38 Temperature (F): 97.8 Height (in): 59 Pulse (bpm): 76 Weight (lbs): 232.8 Respiratory Rate (breaths/min): 18 Source: Measured Blood Pressure (mmHg): 163/54 Body Mass Index (BMI): 47 Reference Range: 80 - 120 mg / dl Electronic Signature(s) Signed: 08/19/2018 8:47:37 AM By: Lorine Bears RCP, RRT, CHT Entered By: Lorine Bears on 08/19/2018 08:41:13

## 2018-08-22 DIAGNOSIS — Z853 Personal history of malignant neoplasm of breast: Secondary | ICD-10-CM | POA: Diagnosis not present

## 2018-08-22 DIAGNOSIS — Z9181 History of falling: Secondary | ICD-10-CM | POA: Diagnosis not present

## 2018-08-22 DIAGNOSIS — E538 Deficiency of other specified B group vitamins: Secondary | ICD-10-CM | POA: Diagnosis not present

## 2018-08-22 DIAGNOSIS — N184 Chronic kidney disease, stage 4 (severe): Secondary | ICD-10-CM | POA: Diagnosis not present

## 2018-08-22 DIAGNOSIS — E1122 Type 2 diabetes mellitus with diabetic chronic kidney disease: Secondary | ICD-10-CM | POA: Diagnosis not present

## 2018-08-22 DIAGNOSIS — M75101 Unspecified rotator cuff tear or rupture of right shoulder, not specified as traumatic: Secondary | ICD-10-CM | POA: Diagnosis not present

## 2018-08-22 DIAGNOSIS — I890 Lymphedema, not elsewhere classified: Secondary | ICD-10-CM | POA: Diagnosis not present

## 2018-08-22 DIAGNOSIS — I129 Hypertensive chronic kidney disease with stage 1 through stage 4 chronic kidney disease, or unspecified chronic kidney disease: Secondary | ICD-10-CM | POA: Diagnosis not present

## 2018-08-22 DIAGNOSIS — I872 Venous insufficiency (chronic) (peripheral): Secondary | ICD-10-CM | POA: Diagnosis not present

## 2018-08-22 DIAGNOSIS — N2581 Secondary hyperparathyroidism of renal origin: Secondary | ICD-10-CM | POA: Diagnosis not present

## 2018-08-22 DIAGNOSIS — Z794 Long term (current) use of insulin: Secondary | ICD-10-CM | POA: Diagnosis not present

## 2018-08-22 DIAGNOSIS — Z87891 Personal history of nicotine dependence: Secondary | ICD-10-CM | POA: Diagnosis not present

## 2018-08-22 DIAGNOSIS — E785 Hyperlipidemia, unspecified: Secondary | ICD-10-CM | POA: Diagnosis not present

## 2018-08-22 DIAGNOSIS — M810 Age-related osteoporosis without current pathological fracture: Secondary | ICD-10-CM | POA: Diagnosis not present

## 2018-08-22 DIAGNOSIS — M48061 Spinal stenosis, lumbar region without neurogenic claudication: Secondary | ICD-10-CM | POA: Diagnosis not present

## 2018-08-22 DIAGNOSIS — E559 Vitamin D deficiency, unspecified: Secondary | ICD-10-CM | POA: Diagnosis not present

## 2018-08-22 DIAGNOSIS — M1990 Unspecified osteoarthritis, unspecified site: Secondary | ICD-10-CM | POA: Diagnosis not present

## 2018-08-25 ENCOUNTER — Other Ambulatory Visit: Payer: Self-pay | Admitting: Physician Assistant

## 2018-08-25 DIAGNOSIS — I890 Lymphedema, not elsewhere classified: Secondary | ICD-10-CM

## 2018-08-25 DIAGNOSIS — I878 Other specified disorders of veins: Secondary | ICD-10-CM

## 2018-08-27 DIAGNOSIS — Z794 Long term (current) use of insulin: Secondary | ICD-10-CM | POA: Diagnosis not present

## 2018-08-27 DIAGNOSIS — I872 Venous insufficiency (chronic) (peripheral): Secondary | ICD-10-CM | POA: Diagnosis not present

## 2018-08-27 DIAGNOSIS — Z87891 Personal history of nicotine dependence: Secondary | ICD-10-CM | POA: Diagnosis not present

## 2018-08-27 DIAGNOSIS — Z853 Personal history of malignant neoplasm of breast: Secondary | ICD-10-CM | POA: Diagnosis not present

## 2018-08-27 DIAGNOSIS — E785 Hyperlipidemia, unspecified: Secondary | ICD-10-CM | POA: Diagnosis not present

## 2018-08-27 DIAGNOSIS — I890 Lymphedema, not elsewhere classified: Secondary | ICD-10-CM | POA: Diagnosis not present

## 2018-08-27 DIAGNOSIS — N2581 Secondary hyperparathyroidism of renal origin: Secondary | ICD-10-CM | POA: Diagnosis not present

## 2018-08-27 DIAGNOSIS — M48061 Spinal stenosis, lumbar region without neurogenic claudication: Secondary | ICD-10-CM | POA: Diagnosis not present

## 2018-08-27 DIAGNOSIS — M75101 Unspecified rotator cuff tear or rupture of right shoulder, not specified as traumatic: Secondary | ICD-10-CM | POA: Diagnosis not present

## 2018-08-27 DIAGNOSIS — E559 Vitamin D deficiency, unspecified: Secondary | ICD-10-CM | POA: Diagnosis not present

## 2018-08-27 DIAGNOSIS — E1122 Type 2 diabetes mellitus with diabetic chronic kidney disease: Secondary | ICD-10-CM | POA: Diagnosis not present

## 2018-08-27 DIAGNOSIS — M810 Age-related osteoporosis without current pathological fracture: Secondary | ICD-10-CM | POA: Diagnosis not present

## 2018-08-27 DIAGNOSIS — Z9181 History of falling: Secondary | ICD-10-CM | POA: Diagnosis not present

## 2018-08-27 DIAGNOSIS — N184 Chronic kidney disease, stage 4 (severe): Secondary | ICD-10-CM | POA: Diagnosis not present

## 2018-08-27 DIAGNOSIS — I129 Hypertensive chronic kidney disease with stage 1 through stage 4 chronic kidney disease, or unspecified chronic kidney disease: Secondary | ICD-10-CM | POA: Diagnosis not present

## 2018-08-27 DIAGNOSIS — M1990 Unspecified osteoarthritis, unspecified site: Secondary | ICD-10-CM | POA: Diagnosis not present

## 2018-08-27 DIAGNOSIS — E538 Deficiency of other specified B group vitamins: Secondary | ICD-10-CM | POA: Diagnosis not present

## 2018-08-28 DIAGNOSIS — N2581 Secondary hyperparathyroidism of renal origin: Secondary | ICD-10-CM | POA: Diagnosis not present

## 2018-08-28 DIAGNOSIS — N183 Chronic kidney disease, stage 3 (moderate): Secondary | ICD-10-CM | POA: Diagnosis not present

## 2018-08-28 DIAGNOSIS — E1122 Type 2 diabetes mellitus with diabetic chronic kidney disease: Secondary | ICD-10-CM | POA: Diagnosis not present

## 2018-08-28 DIAGNOSIS — R600 Localized edema: Secondary | ICD-10-CM | POA: Diagnosis not present

## 2018-08-28 DIAGNOSIS — I10 Essential (primary) hypertension: Secondary | ICD-10-CM | POA: Diagnosis not present

## 2018-08-29 ENCOUNTER — Other Ambulatory Visit: Payer: Self-pay | Admitting: Physician Assistant

## 2018-08-29 ENCOUNTER — Ambulatory Visit
Admission: RE | Admit: 2018-08-29 | Discharge: 2018-08-29 | Disposition: A | Discharge status: Home / Self Care | Payer: Medicare Other | Source: Ambulatory Visit | Attending: Physician Assistant | Admitting: Physician Assistant

## 2018-08-29 DIAGNOSIS — I890 Lymphedema, not elsewhere classified: Secondary | ICD-10-CM | POA: Insufficient documentation

## 2018-08-29 DIAGNOSIS — T148XXA Other injury of unspecified body region, initial encounter: Secondary | ICD-10-CM

## 2018-08-29 DIAGNOSIS — I878 Other specified disorders of veins: Secondary | ICD-10-CM

## 2018-09-01 ENCOUNTER — Encounter (INDEPENDENT_AMBULATORY_CARE_PROVIDER_SITE_OTHER): Payer: Medicare Other | Admitting: Vascular Surgery

## 2018-09-03 DIAGNOSIS — N2581 Secondary hyperparathyroidism of renal origin: Secondary | ICD-10-CM | POA: Diagnosis not present

## 2018-09-03 DIAGNOSIS — M1990 Unspecified osteoarthritis, unspecified site: Secondary | ICD-10-CM | POA: Diagnosis not present

## 2018-09-03 DIAGNOSIS — E785 Hyperlipidemia, unspecified: Secondary | ICD-10-CM | POA: Diagnosis not present

## 2018-09-03 DIAGNOSIS — Z794 Long term (current) use of insulin: Secondary | ICD-10-CM | POA: Diagnosis not present

## 2018-09-03 DIAGNOSIS — E538 Deficiency of other specified B group vitamins: Secondary | ICD-10-CM | POA: Diagnosis not present

## 2018-09-03 DIAGNOSIS — M810 Age-related osteoporosis without current pathological fracture: Secondary | ICD-10-CM | POA: Diagnosis not present

## 2018-09-03 DIAGNOSIS — E1122 Type 2 diabetes mellitus with diabetic chronic kidney disease: Secondary | ICD-10-CM | POA: Diagnosis not present

## 2018-09-03 DIAGNOSIS — N184 Chronic kidney disease, stage 4 (severe): Secondary | ICD-10-CM | POA: Diagnosis not present

## 2018-09-03 DIAGNOSIS — M48061 Spinal stenosis, lumbar region without neurogenic claudication: Secondary | ICD-10-CM | POA: Diagnosis not present

## 2018-09-03 DIAGNOSIS — I890 Lymphedema, not elsewhere classified: Secondary | ICD-10-CM | POA: Diagnosis not present

## 2018-09-03 DIAGNOSIS — E559 Vitamin D deficiency, unspecified: Secondary | ICD-10-CM | POA: Diagnosis not present

## 2018-09-03 DIAGNOSIS — Z9181 History of falling: Secondary | ICD-10-CM | POA: Diagnosis not present

## 2018-09-03 DIAGNOSIS — M75101 Unspecified rotator cuff tear or rupture of right shoulder, not specified as traumatic: Secondary | ICD-10-CM | POA: Diagnosis not present

## 2018-09-03 DIAGNOSIS — Z853 Personal history of malignant neoplasm of breast: Secondary | ICD-10-CM | POA: Diagnosis not present

## 2018-09-03 DIAGNOSIS — I872 Venous insufficiency (chronic) (peripheral): Secondary | ICD-10-CM | POA: Diagnosis not present

## 2018-09-03 DIAGNOSIS — I129 Hypertensive chronic kidney disease with stage 1 through stage 4 chronic kidney disease, or unspecified chronic kidney disease: Secondary | ICD-10-CM | POA: Diagnosis not present

## 2018-09-03 DIAGNOSIS — Z87891 Personal history of nicotine dependence: Secondary | ICD-10-CM | POA: Diagnosis not present

## 2018-09-05 DIAGNOSIS — Z87891 Personal history of nicotine dependence: Secondary | ICD-10-CM | POA: Diagnosis not present

## 2018-09-05 DIAGNOSIS — I872 Venous insufficiency (chronic) (peripheral): Secondary | ICD-10-CM | POA: Diagnosis not present

## 2018-09-05 DIAGNOSIS — M48061 Spinal stenosis, lumbar region without neurogenic claudication: Secondary | ICD-10-CM | POA: Diagnosis not present

## 2018-09-05 DIAGNOSIS — M1990 Unspecified osteoarthritis, unspecified site: Secondary | ICD-10-CM | POA: Diagnosis not present

## 2018-09-05 DIAGNOSIS — M75101 Unspecified rotator cuff tear or rupture of right shoulder, not specified as traumatic: Secondary | ICD-10-CM | POA: Diagnosis not present

## 2018-09-05 DIAGNOSIS — E785 Hyperlipidemia, unspecified: Secondary | ICD-10-CM | POA: Diagnosis not present

## 2018-09-05 DIAGNOSIS — Z9181 History of falling: Secondary | ICD-10-CM | POA: Diagnosis not present

## 2018-09-05 DIAGNOSIS — Z794 Long term (current) use of insulin: Secondary | ICD-10-CM | POA: Diagnosis not present

## 2018-09-05 DIAGNOSIS — E538 Deficiency of other specified B group vitamins: Secondary | ICD-10-CM | POA: Diagnosis not present

## 2018-09-05 DIAGNOSIS — M810 Age-related osteoporosis without current pathological fracture: Secondary | ICD-10-CM | POA: Diagnosis not present

## 2018-09-05 DIAGNOSIS — I890 Lymphedema, not elsewhere classified: Secondary | ICD-10-CM | POA: Diagnosis not present

## 2018-09-05 DIAGNOSIS — E1122 Type 2 diabetes mellitus with diabetic chronic kidney disease: Secondary | ICD-10-CM | POA: Diagnosis not present

## 2018-09-05 DIAGNOSIS — N2581 Secondary hyperparathyroidism of renal origin: Secondary | ICD-10-CM | POA: Diagnosis not present

## 2018-09-05 DIAGNOSIS — I129 Hypertensive chronic kidney disease with stage 1 through stage 4 chronic kidney disease, or unspecified chronic kidney disease: Secondary | ICD-10-CM | POA: Diagnosis not present

## 2018-09-05 DIAGNOSIS — N184 Chronic kidney disease, stage 4 (severe): Secondary | ICD-10-CM | POA: Diagnosis not present

## 2018-09-05 DIAGNOSIS — E559 Vitamin D deficiency, unspecified: Secondary | ICD-10-CM | POA: Diagnosis not present

## 2018-09-05 DIAGNOSIS — Z853 Personal history of malignant neoplasm of breast: Secondary | ICD-10-CM | POA: Diagnosis not present

## 2018-09-10 DIAGNOSIS — N184 Chronic kidney disease, stage 4 (severe): Secondary | ICD-10-CM | POA: Diagnosis not present

## 2018-09-10 DIAGNOSIS — M75101 Unspecified rotator cuff tear or rupture of right shoulder, not specified as traumatic: Secondary | ICD-10-CM | POA: Diagnosis not present

## 2018-09-10 DIAGNOSIS — E538 Deficiency of other specified B group vitamins: Secondary | ICD-10-CM | POA: Diagnosis not present

## 2018-09-10 DIAGNOSIS — M48061 Spinal stenosis, lumbar region without neurogenic claudication: Secondary | ICD-10-CM | POA: Diagnosis not present

## 2018-09-10 DIAGNOSIS — I872 Venous insufficiency (chronic) (peripheral): Secondary | ICD-10-CM | POA: Diagnosis not present

## 2018-09-10 DIAGNOSIS — E559 Vitamin D deficiency, unspecified: Secondary | ICD-10-CM | POA: Diagnosis not present

## 2018-09-10 DIAGNOSIS — E1122 Type 2 diabetes mellitus with diabetic chronic kidney disease: Secondary | ICD-10-CM | POA: Diagnosis not present

## 2018-09-10 DIAGNOSIS — N2581 Secondary hyperparathyroidism of renal origin: Secondary | ICD-10-CM | POA: Diagnosis not present

## 2018-09-10 DIAGNOSIS — M810 Age-related osteoporosis without current pathological fracture: Secondary | ICD-10-CM | POA: Diagnosis not present

## 2018-09-10 DIAGNOSIS — Z87891 Personal history of nicotine dependence: Secondary | ICD-10-CM | POA: Diagnosis not present

## 2018-09-10 DIAGNOSIS — Z794 Long term (current) use of insulin: Secondary | ICD-10-CM | POA: Diagnosis not present

## 2018-09-10 DIAGNOSIS — I129 Hypertensive chronic kidney disease with stage 1 through stage 4 chronic kidney disease, or unspecified chronic kidney disease: Secondary | ICD-10-CM | POA: Diagnosis not present

## 2018-09-10 DIAGNOSIS — M1990 Unspecified osteoarthritis, unspecified site: Secondary | ICD-10-CM | POA: Diagnosis not present

## 2018-09-10 DIAGNOSIS — I890 Lymphedema, not elsewhere classified: Secondary | ICD-10-CM | POA: Diagnosis not present

## 2018-09-10 DIAGNOSIS — Z853 Personal history of malignant neoplasm of breast: Secondary | ICD-10-CM | POA: Diagnosis not present

## 2018-09-10 DIAGNOSIS — Z9181 History of falling: Secondary | ICD-10-CM | POA: Diagnosis not present

## 2018-09-10 DIAGNOSIS — E785 Hyperlipidemia, unspecified: Secondary | ICD-10-CM | POA: Diagnosis not present

## 2018-09-12 ENCOUNTER — Other Ambulatory Visit (INDEPENDENT_AMBULATORY_CARE_PROVIDER_SITE_OTHER): Payer: Medicare Other | Admitting: Internal Medicine

## 2018-09-12 DIAGNOSIS — E538 Deficiency of other specified B group vitamins: Secondary | ICD-10-CM

## 2018-09-12 DIAGNOSIS — E1122 Type 2 diabetes mellitus with diabetic chronic kidney disease: Secondary | ICD-10-CM

## 2018-09-12 DIAGNOSIS — Z794 Long term (current) use of insulin: Secondary | ICD-10-CM | POA: Insufficient documentation

## 2018-09-12 DIAGNOSIS — M810 Age-related osteoporosis without current pathological fracture: Secondary | ICD-10-CM

## 2018-09-12 DIAGNOSIS — N184 Chronic kidney disease, stage 4 (severe): Secondary | ICD-10-CM

## 2018-09-12 DIAGNOSIS — R600 Localized edema: Secondary | ICD-10-CM | POA: Diagnosis not present

## 2018-09-12 DIAGNOSIS — E559 Vitamin D deficiency, unspecified: Secondary | ICD-10-CM

## 2018-09-12 DIAGNOSIS — M48061 Spinal stenosis, lumbar region without neurogenic claudication: Secondary | ICD-10-CM

## 2018-09-12 DIAGNOSIS — E785 Hyperlipidemia, unspecified: Secondary | ICD-10-CM

## 2018-09-12 DIAGNOSIS — E1169 Type 2 diabetes mellitus with other specified complication: Secondary | ICD-10-CM

## 2018-09-12 DIAGNOSIS — M159 Polyosteoarthritis, unspecified: Secondary | ICD-10-CM

## 2018-09-12 DIAGNOSIS — N2581 Secondary hyperparathyroidism of renal origin: Secondary | ICD-10-CM

## 2018-09-12 DIAGNOSIS — M67911 Unspecified disorder of synovium and tendon, right shoulder: Secondary | ICD-10-CM

## 2018-09-12 DIAGNOSIS — I10 Essential (primary) hypertension: Secondary | ICD-10-CM

## 2018-09-12 DIAGNOSIS — I129 Hypertensive chronic kidney disease with stage 1 through stage 4 chronic kidney disease, or unspecified chronic kidney disease: Secondary | ICD-10-CM

## 2018-09-12 NOTE — Progress Notes (Signed)
Received orders from [facility]. Start of care 06/07/18. Orders for re-certification dates 0/03/49 to 10/04/18. Orders are reviewed, signed and faxed.

## 2018-09-17 ENCOUNTER — Ambulatory Visit (INDEPENDENT_AMBULATORY_CARE_PROVIDER_SITE_OTHER): Payer: Medicare Other | Admitting: Vascular Surgery

## 2018-09-17 ENCOUNTER — Encounter (INDEPENDENT_AMBULATORY_CARE_PROVIDER_SITE_OTHER): Payer: Self-pay | Admitting: Vascular Surgery

## 2018-09-17 VITALS — BP 151/68 | HR 76 | Resp 16 | Ht 59.0 in | Wt 234.0 lb

## 2018-09-17 DIAGNOSIS — E1169 Type 2 diabetes mellitus with other specified complication: Secondary | ICD-10-CM

## 2018-09-17 DIAGNOSIS — Z87891 Personal history of nicotine dependence: Secondary | ICD-10-CM

## 2018-09-17 DIAGNOSIS — E1122 Type 2 diabetes mellitus with diabetic chronic kidney disease: Secondary | ICD-10-CM | POA: Diagnosis not present

## 2018-09-17 DIAGNOSIS — M810 Age-related osteoporosis without current pathological fracture: Secondary | ICD-10-CM | POA: Diagnosis not present

## 2018-09-17 DIAGNOSIS — M1990 Unspecified osteoarthritis, unspecified site: Secondary | ICD-10-CM | POA: Diagnosis not present

## 2018-09-17 DIAGNOSIS — M75101 Unspecified rotator cuff tear or rupture of right shoulder, not specified as traumatic: Secondary | ICD-10-CM | POA: Diagnosis not present

## 2018-09-17 DIAGNOSIS — E538 Deficiency of other specified B group vitamins: Secondary | ICD-10-CM | POA: Diagnosis not present

## 2018-09-17 DIAGNOSIS — I890 Lymphedema, not elsewhere classified: Secondary | ICD-10-CM | POA: Diagnosis not present

## 2018-09-17 DIAGNOSIS — I129 Hypertensive chronic kidney disease with stage 1 through stage 4 chronic kidney disease, or unspecified chronic kidney disease: Secondary | ICD-10-CM | POA: Diagnosis not present

## 2018-09-17 DIAGNOSIS — E785 Hyperlipidemia, unspecified: Secondary | ICD-10-CM | POA: Diagnosis not present

## 2018-09-17 DIAGNOSIS — Z9181 History of falling: Secondary | ICD-10-CM | POA: Diagnosis not present

## 2018-09-17 DIAGNOSIS — Z794 Long term (current) use of insulin: Secondary | ICD-10-CM | POA: Diagnosis not present

## 2018-09-17 DIAGNOSIS — N2581 Secondary hyperparathyroidism of renal origin: Secondary | ICD-10-CM | POA: Diagnosis not present

## 2018-09-17 DIAGNOSIS — N184 Chronic kidney disease, stage 4 (severe): Secondary | ICD-10-CM | POA: Diagnosis not present

## 2018-09-17 DIAGNOSIS — Z853 Personal history of malignant neoplasm of breast: Secondary | ICD-10-CM | POA: Diagnosis not present

## 2018-09-17 DIAGNOSIS — I872 Venous insufficiency (chronic) (peripheral): Secondary | ICD-10-CM | POA: Diagnosis not present

## 2018-09-17 DIAGNOSIS — M48061 Spinal stenosis, lumbar region without neurogenic claudication: Secondary | ICD-10-CM | POA: Diagnosis not present

## 2018-09-17 DIAGNOSIS — E559 Vitamin D deficiency, unspecified: Secondary | ICD-10-CM | POA: Diagnosis not present

## 2018-09-17 NOTE — Progress Notes (Signed)
Subjective:    Patient ID: Heather Long, female    DOB: 12-12-1927, 84 y.o.   MRN: [id number]  Presents as a new patient referred by PA Stone from the [facility] for evaluation of bilateral lower extremity edema discomfort.  The patient is relatively new to the area relocating from [place].  The patient notes a long-standing history of experiencing bilateral leg swelling.  The patient notes that over the years, her swelling has become worse and she now notices a discoloration to her legs.  The patient has had an ulcer in the past. No active ulcers at this time.  The patient does not engage in conservative therapy at this time including wearing medical grade one compression socks, elevating her legs and remaining active on a daily basis.  The patient notes that her edema is associated with discomfort.  Both her edema and discomfort worsen towards the end of the day with sitting and standing for long periods of time.  The patient notes that her symptoms have progressed to the point that she is unable to function on a daily basis and they have become lifestyle limiting.  The patient underwent a bilateral lower extremity arterial duplex conducted at [facility] Radiology Department which was limited due to patient intolerance and edema however did not show significant arterial disease.  Patient denies any claudication-like symptoms or rest pain.  Patient denies any fever, nausea vomiting.  Review of Systems  Constitutional: Negative.   HENT: Negative.   Eyes: Negative.   Respiratory: Negative.   Cardiovascular: Positive for leg swelling.       Lower Extremity Discomfort  Gastrointestinal: Negative.   Endocrine: Negative.   Genitourinary: Negative.   Musculoskeletal: Negative.   Skin: Negative.   Allergic/Immunologic: Negative.   Neurological: Negative.   Hematological: Negative.   Psychiatric/Behavioral: Negative.       Objective:   Physical Exam  Constitutional: She is oriented to person, place, and time. She appears well-developed and well-nourished. No distress.  HENT:  Head: Normocephalic and atraumatic.  Right Ear: External ear normal.  Left Ear: External ear normal.  Eyes: Pupils are equal, round, and reactive to light. Conjunctivae and EOM are normal.  Neck: Normal range of motion.  Cardiovascular: Normal rate, regular rhythm, normal heart sounds and intact distal pulses.  Pulses:      Radial pulses are 2+ on the right side, and 2+ on the left side.  Hard to palpate pedal pulses due to body habitus and edema however the bilateral feet are warm and there is good capillary refill  Pulmonary/Chest: Effort normal and breath sounds normal.  Musculoskeletal: Normal range of motion. She exhibits edema (Moderate 2+ pitting edema noted bilaterally).  Neurological: She is alert and oriented to person, place, and time.  Skin: She is not diaphoretic.  There is mild to moderate stasis dermatitis noted to the distal bilateral calves.  The beginning of fibrosis is noted bilaterally.  There is no cellulitis or active ulcerations noted at this time.  Psychiatric: She has a normal mood and affect. Her behavior is normal. Judgment and thought content normal.  Vitals reviewed.  BP (!) 151/68 (BP Location: Left Arm)   Pulse 76   Resp 16   Ht 4\' 11"  (1.499 m)   Wt 234 lb (106.1 kg)   BMI 47.26 kg/m   Past Medical History:  Diagnosis Date  . Cancer ([place])   . Chronic kidney disease   . Diabetes mellitus without  complication ([place])   . Gout   . Hyperlipidemia   . Hypertension   . Lymphedema   . Obesity   . Osteoporosis   . Paget's disease of bone   . Spinal stenosis    Social History   Socioeconomic History  . Marital status: Divorced    Spouse name: Not on file  . Number of children: 2  . Years of education: Not on file  . Highest education level: 12th grade  Occupational History  . Occupation: Retired  Photographer  . Financial resource strain: Not hard at all  . Food insecurity:    Worry: Never true    Inability: Never true  . Transportation needs:    Medical: No    Non-medical: No  Tobacco Use  . Smoking status: Former Smoker    Packs/day: 0.25    Years: 2.00    Pack years: 0.50    Types: Cigarettes    Last attempt to quit: 01/02/1952    Years since quitting: 66.7  . Smokeless tobacco: Never Used  . Tobacco comment: smoking cessation materials not required  Substance and Sexual Activity  . Alcohol use: No  . Drug use: No  . Sexual activity: Not Currently  Lifestyle  . Physical activity:    Days per week: 0 days    Minutes per session: 0 min  . Stress: Only a little  Relationships  . Social connections:    Talks on phone: Patient refused    Gets together: Patient refused    Attends religious service: Patient refused    Active member of club or organization: Patient refused    Attends meetings of clubs or organizations: Patient refused    Relationship status: Divorced  . Intimate partner violence:    Fear of current or ex partner: No    Emotionally abused: No    Physically abused: No    Forced sexual activity: No  Other Topics Concern  . Not on file  Social History Narrative  . Not on file   Past Surgical History:  Procedure Laterality Date  . ABDOMINAL HYSTERECTOMY    . JOINT REPLACEMENT  2000, 2006  . MASTECTOMY Left 2010  . ORIF WRIST FRACTURE  07/2016   Family History  Problem Relation Age of Onset  . Diabetes Mother   . Cancer Mother   . Heart disease Father   . Throat cancer Sister   . Heart disease Brother   . Diabetes Brother   . Cancer Sister   . Heart disease Brother   . Heart disease Brother    Allergies  Allergen Reactions  . Ciprofloxacin Nausea And Vomiting  . Gluten Meal Other (See Comments)    GI distress  . Pioglitazone Other (See Comments)    Leg swelling  . Gabapentin Diarrhea, Nausea Only and Other (See Comments)    dizziness       Assessment & Plan:  Presents as a new patient referred by PA Stone from the [facility] for evaluation of bilateral lower extremity edema discomfort.  The patient is relatively new to the area relocating from [place].  The patient notes a long-standing history of experiencing bilateral leg swelling.  The patient notes that over the years, her swelling has become worse and she now notices a discoloration to her legs.  The patient has had an ulcer in the past. No active ulcers at this time.  The patient does not engage in conservative therapy at this  time including wearing medical grade one compression socks, elevating her legs and remaining active on a daily basis.  The patient notes that her edema is associated with discomfort.  Both her edema and discomfort worsen towards the end of the day with sitting and standing for long periods of time.  The patient notes that her symptoms have progressed to the point that she is unable to function on a daily basis and they have become lifestyle limiting.  The patient underwent a bilateral lower extremity arterial duplex conducted at [facility] Radiology Department which was limited due to patient intolerance and edema however did not show significant arterial disease.  Patient denies any claudication-like symptoms or rest pain.  Patient denies any fever, nausea vomiting.  1. Lymphedema of arm - New Patient states she experiences left upper extremity edema on an intermittent basis "for years" since she underwent breast cancer surgery and what sounds like some axillary node dissection. Patient denies any ulcer formation or cellulitis to the upper extremity. Encourage the patient to wear compression sleeve to her left upper extremity to help control symptoms.  2. Lymphedema - New In an attempt to control the patient's moderate pitting edema I recommend undergoing a month of 3 layer zinc oxide and wraps to the  bilateral lower extremity changed weekly. We discussed appropriate elevation as heart level or higher as much as possible especially during the day The patient knows that this is temporary and attempt to control the edema and will be transitioning to medical grade 1 compression socks once control is gained.  3. Hyperlipidemia associated with type 2 diabetes mellitus ([place]) - Stable Encouraged good control as its slows the progression of atherosclerotic disease  Current Outpatient Medications on File Prior to Visit  Medication Sig Dispense Refill  . ACCU-CHEK AVIVA PLUS test strip USE AS DIRECTED THREE TIMES DAILY 100 each 3  . acetaminophen (TYLENOL) 325 MG tablet Take 650 mg by mouth every 6 (six) hours as needed.    Marland Kitchen amLODipine (NORVASC) 5 MG tablet Take 5 mg by mouth daily.    . Ascorbic Acid (VITAMIN C) 1000 MG tablet Take 1,000 mg by mouth daily.    . B-D UF III MINI PEN NEEDLES 31G X 5 MM MISC USE AS DIRECTED EVERY DAY 100 each 0  . Cholecalciferol (VITAMIN D3) 5000 units CAPS Take by mouth. Daily    . diclofenac sodium (VOLTAREN) 1 % GEL Apply 4 g topically 4 (four) times daily. To affected area as needed 100 g 0  . furosemide (LASIX) 40 MG tablet TK 1 T PO QD  6  . glucose blood test strip Please dispence test strips and lancets of patient's choice.  Check sugars three times daily    . LANTUS SOLOSTAR 100 UNIT/ML Solostar Pen ADMINISTER 34 UNITS UNDER THE SKIN DAILY AT 10 PM (Patient taking differently: 32-34 Units. ) 15 mL 12  . losartan (COZAAR) 50 MG tablet Take 100 mg by mouth daily.     . Multiple Vitamins-Minerals (PRESERVISION AREDS PO) Take by mouth.    . vitamin B-12 (CYANOCOBALAMIN) 1000 MCG tablet Take 1 tablet (1,000 mcg total) by mouth daily.     No current facility-administered medications on file prior to visit.    There are no Patient Instructions on file for this visit. No follow-ups on file.  Mariza Bourget A Luverna Degenhart, PA-C

## 2018-09-24 ENCOUNTER — Ambulatory Visit (INDEPENDENT_AMBULATORY_CARE_PROVIDER_SITE_OTHER): Payer: Medicare Other | Admitting: Vascular Surgery

## 2018-09-24 ENCOUNTER — Encounter (INDEPENDENT_AMBULATORY_CARE_PROVIDER_SITE_OTHER): Payer: Self-pay

## 2018-09-24 VITALS — BP 179/77 | HR 78 | Resp 18 | Ht 60.0 in | Wt 236.0 lb

## 2018-09-24 DIAGNOSIS — R600 Localized edema: Secondary | ICD-10-CM

## 2018-09-24 DIAGNOSIS — I890 Lymphedema, not elsewhere classified: Secondary | ICD-10-CM

## 2018-09-24 DIAGNOSIS — I872 Venous insufficiency (chronic) (peripheral): Secondary | ICD-10-CM

## 2018-09-24 NOTE — Progress Notes (Signed)
History of Present Illness  On presentation today, the patient refused to continue with the planned weekly unna boot therapy to gain control of her lower extremity edema in hopes of transitioning to medical grade one compression stockings..  The patient also noted that she stopped taking her diuretic on her own. She was encouraged to touch base with her primary care physician or cardiologist to discuss this decision.  The patient notes she is experiencing increasing swelling to her legs.  Patient denies any shortness of breath / chest pain however I told her this could be very dangerous by stopping this medication and not consulting her physician first.  She expressed her understanding.  Assessments & Plan Patient was given a prescription for medical grade 1 compression socks.  The patient was encouraged to be fitted appropriately at a medical/surgical supply store.  The patient was encouraged to continue to elevate her legs heart level or higher.

## 2018-09-26 DIAGNOSIS — I872 Venous insufficiency (chronic) (peripheral): Secondary | ICD-10-CM | POA: Diagnosis not present

## 2018-09-26 DIAGNOSIS — E538 Deficiency of other specified B group vitamins: Secondary | ICD-10-CM | POA: Diagnosis not present

## 2018-09-26 DIAGNOSIS — N2581 Secondary hyperparathyroidism of renal origin: Secondary | ICD-10-CM | POA: Diagnosis not present

## 2018-09-26 DIAGNOSIS — M1990 Unspecified osteoarthritis, unspecified site: Secondary | ICD-10-CM | POA: Diagnosis not present

## 2018-09-26 DIAGNOSIS — Z87891 Personal history of nicotine dependence: Secondary | ICD-10-CM | POA: Diagnosis not present

## 2018-09-26 DIAGNOSIS — M75101 Unspecified rotator cuff tear or rupture of right shoulder, not specified as traumatic: Secondary | ICD-10-CM | POA: Diagnosis not present

## 2018-09-26 DIAGNOSIS — E1122 Type 2 diabetes mellitus with diabetic chronic kidney disease: Secondary | ICD-10-CM | POA: Diagnosis not present

## 2018-09-26 DIAGNOSIS — E559 Vitamin D deficiency, unspecified: Secondary | ICD-10-CM | POA: Diagnosis not present

## 2018-09-26 DIAGNOSIS — M810 Age-related osteoporosis without current pathological fracture: Secondary | ICD-10-CM | POA: Diagnosis not present

## 2018-09-26 DIAGNOSIS — Z794 Long term (current) use of insulin: Secondary | ICD-10-CM | POA: Diagnosis not present

## 2018-09-26 DIAGNOSIS — E785 Hyperlipidemia, unspecified: Secondary | ICD-10-CM | POA: Diagnosis not present

## 2018-09-26 DIAGNOSIS — Z853 Personal history of malignant neoplasm of breast: Secondary | ICD-10-CM | POA: Diagnosis not present

## 2018-09-26 DIAGNOSIS — Z9181 History of falling: Secondary | ICD-10-CM | POA: Diagnosis not present

## 2018-09-26 DIAGNOSIS — I129 Hypertensive chronic kidney disease with stage 1 through stage 4 chronic kidney disease, or unspecified chronic kidney disease: Secondary | ICD-10-CM | POA: Diagnosis not present

## 2018-09-26 DIAGNOSIS — M48061 Spinal stenosis, lumbar region without neurogenic claudication: Secondary | ICD-10-CM | POA: Diagnosis not present

## 2018-09-26 DIAGNOSIS — I890 Lymphedema, not elsewhere classified: Secondary | ICD-10-CM | POA: Diagnosis not present

## 2018-09-26 DIAGNOSIS — N184 Chronic kidney disease, stage 4 (severe): Secondary | ICD-10-CM | POA: Diagnosis not present

## 2018-09-30 DIAGNOSIS — Z9181 History of falling: Secondary | ICD-10-CM | POA: Diagnosis not present

## 2018-09-30 DIAGNOSIS — M810 Age-related osteoporosis without current pathological fracture: Secondary | ICD-10-CM | POA: Diagnosis not present

## 2018-09-30 DIAGNOSIS — I129 Hypertensive chronic kidney disease with stage 1 through stage 4 chronic kidney disease, or unspecified chronic kidney disease: Secondary | ICD-10-CM | POA: Diagnosis not present

## 2018-09-30 DIAGNOSIS — Z794 Long term (current) use of insulin: Secondary | ICD-10-CM | POA: Diagnosis not present

## 2018-09-30 DIAGNOSIS — N2581 Secondary hyperparathyroidism of renal origin: Secondary | ICD-10-CM | POA: Diagnosis not present

## 2018-09-30 DIAGNOSIS — E1122 Type 2 diabetes mellitus with diabetic chronic kidney disease: Secondary | ICD-10-CM | POA: Diagnosis not present

## 2018-09-30 DIAGNOSIS — I890 Lymphedema, not elsewhere classified: Secondary | ICD-10-CM | POA: Diagnosis not present

## 2018-09-30 DIAGNOSIS — N184 Chronic kidney disease, stage 4 (severe): Secondary | ICD-10-CM | POA: Diagnosis not present

## 2018-09-30 DIAGNOSIS — M48061 Spinal stenosis, lumbar region without neurogenic claudication: Secondary | ICD-10-CM | POA: Diagnosis not present

## 2018-09-30 DIAGNOSIS — Z87891 Personal history of nicotine dependence: Secondary | ICD-10-CM | POA: Diagnosis not present

## 2018-09-30 DIAGNOSIS — E538 Deficiency of other specified B group vitamins: Secondary | ICD-10-CM | POA: Diagnosis not present

## 2018-09-30 DIAGNOSIS — M75101 Unspecified rotator cuff tear or rupture of right shoulder, not specified as traumatic: Secondary | ICD-10-CM | POA: Diagnosis not present

## 2018-09-30 DIAGNOSIS — E559 Vitamin D deficiency, unspecified: Secondary | ICD-10-CM | POA: Diagnosis not present

## 2018-09-30 DIAGNOSIS — I872 Venous insufficiency (chronic) (peripheral): Secondary | ICD-10-CM | POA: Diagnosis not present

## 2018-09-30 DIAGNOSIS — E785 Hyperlipidemia, unspecified: Secondary | ICD-10-CM | POA: Diagnosis not present

## 2018-09-30 DIAGNOSIS — Z853 Personal history of malignant neoplasm of breast: Secondary | ICD-10-CM | POA: Diagnosis not present

## 2018-09-30 DIAGNOSIS — M1990 Unspecified osteoarthritis, unspecified site: Secondary | ICD-10-CM | POA: Diagnosis not present

## 2018-10-01 ENCOUNTER — Encounter (INDEPENDENT_AMBULATORY_CARE_PROVIDER_SITE_OTHER): Payer: Medicare Other

## 2018-10-08 ENCOUNTER — Encounter (INDEPENDENT_AMBULATORY_CARE_PROVIDER_SITE_OTHER): Payer: Medicare Other

## 2018-10-08 DIAGNOSIS — Z853 Personal history of malignant neoplasm of breast: Secondary | ICD-10-CM | POA: Diagnosis not present

## 2018-10-08 DIAGNOSIS — E538 Deficiency of other specified B group vitamins: Secondary | ICD-10-CM | POA: Diagnosis not present

## 2018-10-08 DIAGNOSIS — Z87891 Personal history of nicotine dependence: Secondary | ICD-10-CM | POA: Diagnosis not present

## 2018-10-08 DIAGNOSIS — M48061 Spinal stenosis, lumbar region without neurogenic claudication: Secondary | ICD-10-CM | POA: Diagnosis not present

## 2018-10-08 DIAGNOSIS — I872 Venous insufficiency (chronic) (peripheral): Secondary | ICD-10-CM | POA: Diagnosis not present

## 2018-10-08 DIAGNOSIS — E785 Hyperlipidemia, unspecified: Secondary | ICD-10-CM | POA: Diagnosis not present

## 2018-10-08 DIAGNOSIS — I890 Lymphedema, not elsewhere classified: Secondary | ICD-10-CM | POA: Diagnosis not present

## 2018-10-08 DIAGNOSIS — N2581 Secondary hyperparathyroidism of renal origin: Secondary | ICD-10-CM | POA: Diagnosis not present

## 2018-10-08 DIAGNOSIS — I129 Hypertensive chronic kidney disease with stage 1 through stage 4 chronic kidney disease, or unspecified chronic kidney disease: Secondary | ICD-10-CM | POA: Diagnosis not present

## 2018-10-08 DIAGNOSIS — E559 Vitamin D deficiency, unspecified: Secondary | ICD-10-CM | POA: Diagnosis not present

## 2018-10-08 DIAGNOSIS — M810 Age-related osteoporosis without current pathological fracture: Secondary | ICD-10-CM | POA: Diagnosis not present

## 2018-10-08 DIAGNOSIS — Z794 Long term (current) use of insulin: Secondary | ICD-10-CM | POA: Diagnosis not present

## 2018-10-08 DIAGNOSIS — M75101 Unspecified rotator cuff tear or rupture of right shoulder, not specified as traumatic: Secondary | ICD-10-CM | POA: Diagnosis not present

## 2018-10-08 DIAGNOSIS — N184 Chronic kidney disease, stage 4 (severe): Secondary | ICD-10-CM | POA: Diagnosis not present

## 2018-10-08 DIAGNOSIS — M1990 Unspecified osteoarthritis, unspecified site: Secondary | ICD-10-CM | POA: Diagnosis not present

## 2018-10-08 DIAGNOSIS — E1122 Type 2 diabetes mellitus with diabetic chronic kidney disease: Secondary | ICD-10-CM | POA: Diagnosis not present

## 2018-10-13 DIAGNOSIS — E538 Deficiency of other specified B group vitamins: Secondary | ICD-10-CM | POA: Diagnosis not present

## 2018-10-13 DIAGNOSIS — Z794 Long term (current) use of insulin: Secondary | ICD-10-CM | POA: Diagnosis not present

## 2018-10-13 DIAGNOSIS — E1122 Type 2 diabetes mellitus with diabetic chronic kidney disease: Secondary | ICD-10-CM | POA: Diagnosis not present

## 2018-10-13 DIAGNOSIS — I890 Lymphedema, not elsewhere classified: Secondary | ICD-10-CM | POA: Diagnosis not present

## 2018-10-13 DIAGNOSIS — N184 Chronic kidney disease, stage 4 (severe): Secondary | ICD-10-CM | POA: Diagnosis not present

## 2018-10-13 DIAGNOSIS — Z853 Personal history of malignant neoplasm of breast: Secondary | ICD-10-CM | POA: Diagnosis not present

## 2018-10-13 DIAGNOSIS — M810 Age-related osteoporosis without current pathological fracture: Secondary | ICD-10-CM | POA: Diagnosis not present

## 2018-10-13 DIAGNOSIS — M1990 Unspecified osteoarthritis, unspecified site: Secondary | ICD-10-CM | POA: Diagnosis not present

## 2018-10-13 DIAGNOSIS — E785 Hyperlipidemia, unspecified: Secondary | ICD-10-CM | POA: Diagnosis not present

## 2018-10-13 DIAGNOSIS — N2581 Secondary hyperparathyroidism of renal origin: Secondary | ICD-10-CM | POA: Diagnosis not present

## 2018-10-13 DIAGNOSIS — Z87891 Personal history of nicotine dependence: Secondary | ICD-10-CM | POA: Diagnosis not present

## 2018-10-13 DIAGNOSIS — E559 Vitamin D deficiency, unspecified: Secondary | ICD-10-CM | POA: Diagnosis not present

## 2018-10-13 DIAGNOSIS — I872 Venous insufficiency (chronic) (peripheral): Secondary | ICD-10-CM | POA: Diagnosis not present

## 2018-10-13 DIAGNOSIS — M75101 Unspecified rotator cuff tear or rupture of right shoulder, not specified as traumatic: Secondary | ICD-10-CM | POA: Diagnosis not present

## 2018-10-13 DIAGNOSIS — M48061 Spinal stenosis, lumbar region without neurogenic claudication: Secondary | ICD-10-CM | POA: Diagnosis not present

## 2018-10-13 DIAGNOSIS — I129 Hypertensive chronic kidney disease with stage 1 through stage 4 chronic kidney disease, or unspecified chronic kidney disease: Secondary | ICD-10-CM | POA: Diagnosis not present

## 2018-10-14 ENCOUNTER — Ambulatory Visit (INDEPENDENT_AMBULATORY_CARE_PROVIDER_SITE_OTHER): Payer: Medicare Other | Admitting: Vascular Surgery

## 2018-12-10 ENCOUNTER — Ambulatory Visit: Payer: Medicare Other

## 2018-12-18 ENCOUNTER — Other Ambulatory Visit: Payer: Self-pay

## 2018-12-18 ENCOUNTER — Encounter: Payer: Self-pay | Admitting: Emergency Medicine

## 2018-12-18 ENCOUNTER — Ambulatory Visit: Payer: Medicare Other | Admitting: Internal Medicine

## 2018-12-18 ENCOUNTER — Ambulatory Visit
Admission: EM | Admit: 2018-12-18 | Discharge: 2018-12-18 | Disposition: A | Discharge status: Home / Self Care | Payer: Medicare Other | Attending: Emergency Medicine | Admitting: Emergency Medicine

## 2018-12-18 DIAGNOSIS — Z87891 Personal history of nicotine dependence: Secondary | ICD-10-CM | POA: Diagnosis not present

## 2018-12-18 DIAGNOSIS — S80812A Abrasion, left lower leg, initial encounter: Secondary | ICD-10-CM

## 2018-12-18 DIAGNOSIS — I890 Lymphedema, not elsewhere classified: Secondary | ICD-10-CM | POA: Diagnosis not present

## 2018-12-18 DIAGNOSIS — E119 Type 2 diabetes mellitus without complications: Secondary | ICD-10-CM

## 2018-12-18 NOTE — ED Triage Notes (Signed)
Pt c/o wound on her left leg. She noticed it this morning. She has known lymphedema. She had a clear fluid coming out of the wound. Pt is concerned about getting an infection.

## 2018-12-18 NOTE — Discharge Instructions (Signed)
Follow-up with the wound care center if this has not gotten better in 10 to 14 days.  Return here for any signs of infection.

## 2018-12-18 NOTE — ED Provider Notes (Signed)
HPI  SUBJECTIVE:  Heather Long is a 85 y.o. female who presents with an ulcer noticed earlier today on her left lower extremity.  She denies pain, describes it as "annoying" discomfort.  No trauma, fevers, erythema, tenderness.  No extremity edema, crusting, weeping.  No aggravating or alleviating factors.  She has not tried anything for this.  She thinks that she may have scraped her leg while pulling up her compression hose.  She has a past medical history of breast cancer status post mastectomy, lymphedema of her lower extremities, diabetes, hypertension, venous stasis ulcer, neuropathy, peripheral vascular disease, fibromyalgia.  PMD: Dr. Gaetano Net   Past Medical History:  Diagnosis Date  . Cancer ([place])   . Chronic kidney disease   . Diabetes mellitus without complication ([place])   . Gout   . Hyperlipidemia   . Hypertension   . Lymphedema   . Obesity   . Osteoporosis   . Paget's disease of bone   . Spinal stenosis     Past Surgical History:  Procedure Laterality Date  . ABDOMINAL HYSTERECTOMY    . JOINT REPLACEMENT  2000, 2006  . MASTECTOMY Left 2010  . ORIF WRIST FRACTURE  07/2016    Family History  Problem Relation Age of Onset  . Diabetes Mother   . Cancer Mother   . Heart disease Father   . Throat cancer Sister   . Heart disease Brother   . Diabetes Brother   . Cancer Sister   . Heart disease Brother   . Heart disease Brother     Social History   Tobacco Use  . Smoking status: Former Smoker    Packs/day: 0.25    Years: 2.00    Pack years: 0.50    Types: Cigarettes    Last attempt to quit: 01/02/1952    Years since quitting: 67.0  . Smokeless tobacco: Never Used  . Tobacco comment: smoking cessation materials not required  Substance Use Topics  . Alcohol use: No  . Drug use: No    No current facility-administered medications for this encounter.   Current Outpatient Medications:  .  ACCU-CHEK AVIVA PLUS test strip, USE AS DIRECTED THREE TIMES  DAILY, Disp: 100 each, Rfl: 3 .  acetaminophen (TYLENOL) 325 MG tablet, Take 650 mg by mouth every 6 (six) hours as needed., Disp: , Rfl:  .  amLODipine (NORVASC) 5 MG tablet, Take 5 mg by mouth daily., Disp: , Rfl:  .  Ascorbic Acid (VITAMIN C) 1000 MG tablet, Take 1,000 mg by mouth daily., Disp: , Rfl:  .  B-D UF III MINI PEN NEEDLES 31G X 5 MM MISC, USE AS DIRECTED EVERY DAY, Disp: 100 each, Rfl: 0 .  Cholecalciferol (VITAMIN D3) 5000 units CAPS, Take by mouth. Daily, Disp: , Rfl:  .  furosemide (LASIX) 40 MG tablet, TK 1 T PO QD, Disp: , Rfl: 6 .  LANTUS SOLOSTAR 100 UNIT/ML Solostar Pen, ADMINISTER 34 UNITS UNDER THE SKIN DAILY AT 10 PM (Patient taking differently: 32-34 Units. ), Disp: 15 mL, Rfl: 12 .  losartan (COZAAR) 50 MG tablet, Take 100 mg by mouth daily. , Disp: , Rfl:  .  Multiple Vitamins-Minerals (PRESERVISION AREDS PO), Take by mouth., Disp: , Rfl:  .  vitamin B-12 (CYANOCOBALAMIN) 1000 MCG tablet, Take 1 tablet (1,000 mcg total) by mouth daily., Disp: , Rfl:  .  glucose blood test strip, Please dispence test strips and lancets of patient's choice.  Check sugars three times daily, Disp: ,  Rfl:   Allergies  Allergen Reactions  . Ciprofloxacin Nausea And Vomiting  . Gluten Meal Other (See Comments)    GI distress  . Pioglitazone Other (See Comments)    Leg swelling  . Gabapentin Diarrhea, Nausea Only and Other (See Comments)    dizziness     ROS  As noted in HPI.   Physical Exam  BP (!) 161/83 (BP Location: Right Arm)   Pulse 91   Temp 97.6 F (36.4 C) (Oral)   Resp 16   Ht 4\' 11"  (1.499 m)   Wt 104.3 kg   SpO2 99%   BMI 46.45 kg/m   Constitutional: Well developed, well nourished, no acute distress Eyes:  EOMI, conjunctiva normal bilaterally HENT: Normocephalic, atraumatic,mucus membranes moist Respiratory: Normal inspiratory effort Cardiovascular: Normal rate GI: nondistended skin: 0.5 cm superficial abrasion anterior left lower extremity at the site  of an old ulcer.  See picture.  No erythema, induration, tenderness, crusting.      Musculoskeletal: no deformities Neurologic: Alert & oriented x 3, no focal neuro deficits Psychiatric: Speech and behavior appropriate   ED Course   Medications - No data to display  No orders of the defined types were placed in this encounter.   No results found for this or any previous visit (from the past 24 hour(s)). No results found.  ED Clinical Impression  Abrasion of left lower extremity, initial encounter   ED Assessment/Plan  Patient is a high risk for this not to heal well, so applied a Restore hydrocolloid dressing after cleaning the wound with alcohol.  Then secured with paper tape.  Gave patient another dressing, told her to change it in 5 to 7 days.  Return here for any signs of infection, otherwise follow-up with the wound clinic as needed.  No orders of the defined types were placed in this encounter.   *This clinic note was created using Dragon dictation software. Therefore, there may be occasional mistakes despite careful proofreading.   ?   Melynda Ripple, MD 12/19/18 [phone]

## 2019-01-02 ENCOUNTER — Telehealth: Payer: Self-pay

## 2019-01-02 NOTE — Telephone Encounter (Signed)
Called patient informing her we received diabetic foot forms from the medical supply store she uses and told her we need to see her for an appt before filling these forms out.   She said she no longer is seeing Dr Army Melia and she is changing PCPs.

## 2019-01-09 ENCOUNTER — Other Ambulatory Visit: Payer: Self-pay | Admitting: Internal Medicine

## 2019-02-17 ENCOUNTER — Ambulatory Visit: Payer: Medicare Other | Admitting: Student in an Organized Health Care Education/Training Program

## 2019-03-24 ENCOUNTER — Ambulatory Visit: Payer: Medicare Other | Admitting: Student in an Organized Health Care Education/Training Program

## 2019-04-09 NOTE — Progress Notes (Signed)
Patient's Name: Heather Long  MRN: [id number]  Referring Provider: Anthonette Legato, MD  DOB: February 01, 1928  PCP: No primary care provider on file.  DOS: 04/16/2019  Note by: Gillis Santa, MD  Service setting: Ambulatory outpatient  Specialty: Interventional Pain Management  Location: [facility] Virtual Visit  Visit type: Initial Patient Evaluation  Patient type: New Patient   Pain Management Virtual Encounter Note - Virtual Visit via [place] (real-time audio visits between healthcare provider and patient).  Patient's Phone No.:  [phone] (home); There is no such number on file (mobile).; (Preferred) [phone] [email or web address] .[id number] Shari Prows, [place] [address] [place] [place] [address] Phone: [phone] Fax: [phone]   Pre-screening note:  Our staff contacted Heather Long and offered her an "in person", "face-to-face" appointment versus a telephone encounter. She indicated preferring the telephone encounter, at this time.  Primary Reason(s) for Visit: Tele-Encounter for initial evaluation of one or more chronic problems (new to examiner) potentially causing chronic pain, and posing a threat to normal musculoskeletal function. (Level of risk: High) CC: Back Pain (lower bilateral); Shoulder Pain (bilateral); Wrist Pain (rx 2016); Leg Pain (lympedema); and Generalized Body Aches (fibromyalgia)  I contacted Heather Long on 04/16/2019 at 12:39 PM via video conference.      I clearly identified myself as Gillis Santa, MD. I verified that I was speaking with the correct person using two identifiers (Name and date of birth: 1928-02-24).  Advanced Informed Consent I sought verbal advanced consent from Heather Long for virtual visit interactions. I informed Heather Long of possible security and privacy concerns, risks, and limitations associated with providing  "not-in-person" medical evaluation and management services. I also informed Heather Long of the availability of "in-person" appointments. Finally, I informed her that there would be a charge for the virtual visit and that she could be  personally, fully or partially, financially responsible for it. Heather Long expressed understanding and agreed to proceed.   HPI  Heather Long is a 85 y.o. year old, female patient, contacted today for an initial evaluation of her chronic pain. 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 and hypertension ([place]); Vitamin D deficiency; Lower extremity edema; Spinal stenosis of lumbar region; Vitamin B12 deficiency; History of breast cancer; Essential (primary) hypertension; Generalized OA; Hyperlipidemia associated with type 2 diabetes mellitus ([place]); Lymphedema of arm; Degeneration macular; OP (osteoporosis); Osteitis deformans; Neoplasm of uncertain behavior of skin of face; Psoriasis; Chronic congestion of paranasal sinus; Venous stasis dermatitis of left lower extremity; Secondary hyperparathyroidism of renal origin ([place]); Rotator cuff disorder, right; Long term current use of insulin ([place]); Lymphedema; Chronic pain syndrome; Fibromyalgia; Lumbar degenerative disc disease; Lumbar facet joint syndrome; Chronic SI joint pain; Ankylosing spondylitis of lumbosacral region [facility]); and CKD (chronic kidney disease) stage 3, GFR 30-59 ml/min (HCC) on their problem list.  Pain Assessment: Location:    low back pain, bilateral hips, bilateral knees, bilateral arms Radiating:  low back does radiate to lower buttocks Onset:  >3 years ago Duration:  present throughout the day to varying degrees Quality:  aching, throbbing, nagging, annoying, persistent, dull, burn, sharp Severity:  8/10 (subjective, self-reported pain score)  Effect on ADL:  limits ability to walk and perform ADLs like cooking/cleaning Timing:  worse in the evening in  regards to leg pain Modifying factors:  rest, medications.  Onset and Duration:  Gradual and Date of injury: wrist fx 2016 Cause of pain: Unknown Severity: Getting worse, NAS-11 at its worse: 10/10, NAS-11 at its best: 4/10, NAS-11 now: 4/10 and NAS-11 on the average: 4/10 Timing: Not influenced by the time of the day, During activity or exercise and After activity or exercise Aggravating Factors: Motion Alleviating Factors: Acupuncture, Lying down, Sitting and uses a cane Associated Problems: Dizziness, Fatigue, Numbness, Swelling, Pain that wakes patient up and Pain that does not allow patient to sleep Quality of Pain: Aching, Burning, Constant, Numb, Tingling and Uncomfortable Previous Examinations or Tests: X-rays, Orthopedic evaluation and Chiropractic evaluation Previous Treatments: The patient denies patient states she had injections into back, not sure what kind  Hx of bilateral knee replacement (2000,2006) for severe OA. Hx of lymphedema (left breast removal due to breast cancer). Also has a hx of fibromyalgia. Hx of diabetic neuropathy, patient in on insulin  (A1C 6.8 Sept 2019).  Patient's primary pain generator is her low back and her buttocks.  She states that she has significant difficulty walking.  This is further exacerbated by her left arm pain related to her chronic lymphedema after her left breast surgery.  Patient also has a history of bilateral knee replacement.  Patient does endorse occasional dizziness.  Previous imaging of her spine was performed January 2019 which shows severe diffuse osteopenia and severe degenerative changes with mild L3 on L4 anterolisthesis along with ankylosing spondylitis of her lumbar spine with bilateral sacroiliitis.  Patient has participated in physical therapy many years ago.  Patient has had injections in her back in the past but does not recall what kind.  Patient also has stage 3 chronic kidney disease.  Avoid NSAIDs.  She is also on Lasix for  lower extremity swelling.  The patient was informed that my practice is divided into two sections: an interventional pain management section, as well as a completely separate and distinct medication management section. I explained that I have procedure days for my interventional therapies, and evaluation days for follow-ups and medication management. Because of the amount of documentation required during both, they are kept separated. This means that there is the possibility that she may be scheduled for a procedure on one day, and medication management the next. I have also informed her that because of staffing and facility limitations, I no longer take patients for medication management only. To illustrate the reasons for this, I gave the patient the example of surgeons, and how inappropriate it would be to refer a patient to his/her care, just to write for the post-surgical antibiotics on a surgery done by a different surgeon.   Because interventional pain management is my board-certified specialty, the patient was informed that joining my practice means that they are open to any and all interventional therapies. I made it clear that this does not mean that they will be forced to have any procedures done. What this means is that I believe interventional therapies to be essential part of the diagnosis and proper management of chronic pain conditions. Therefore, patients not interested in these interventional alternatives will be better served under the care of a different practitioner.  The patient was also made aware of my Comprehensive Pain Management Safety Guidelines where by joining my practice, they limit all of their nerve blocks and joint injections to those done by our practice, for as long as we are retained to manage their care.   Historic Controlled Substance Pharmacotherapy Review   Historical Monitoring: The patient  reports no history of drug use. List of all UDS Test(s): No results  found for: MDMA, COCAINSCRNUR, [place], [place], CANNABQUANT, THCU, [place] List of other Serum/Urine Drug Screening Test(s):  No results found for: AMPHSCRSER, BARBSCRSER, BENZOSCRSER, COCAINSCRSER, COCAINSCRNUR, PCPSCRSER, PCPQUANT, THCSCRSER, THCU, CANNABQUANT, OPIATESCRSER, OXYSCRSER, PROPOXSCRSER, ETH Historical Background Evaluation: [place] PMP: PDMP reviewed during this encounter. Six (6) year initial data search conducted.             [place] Department of public safety, offender search: Editor, commissioning Information) Non-contributory Risk Assessment Profile: Aberrant behavior: None observed or detected today Risk factors for fatal opioid overdose: None identified today Fatal overdose hazard ratio (HR): Calculation deferred Non-fatal overdose hazard ratio (HR): Calculation deferred Risk of opioid abuse or dependence: 0.7-3.0% with doses ? 36 MME/day and 6.1-26% with doses ? 120 MME/day. Substance use disorder (SUD) risk level: See below Personal History of Substance Abuse (SUD-Substance use disorder):  Alcohol: Negative  Illegal Drugs: Negative  Rx Drugs: Negative  ORT Risk Level calculation: Low Risk Opioid Risk Tool - 04/15/19 1402      Family History of Substance Abuse   Alcohol  Negative    Illegal Drugs  Negative    Rx Drugs  Negative      Personal History of Substance Abuse   Alcohol  Negative    Illegal Drugs  Negative    Rx Drugs  Negative      Age   Age between 33-45 years   No      Psychological Disease   Psychological Disease  Negative    Depression  Negative      Total Score   Opioid Risk Tool Scoring  0    Opioid Risk Interpretation  Low Risk      ORT Scoring interpretation table:  Score <3 = Low Risk for SUD  Score between 4-7 = Moderate Risk for SUD  Score >8 = High Risk for Opioid Abuse   Pharmacologic Plan: As per protocol, I have not taken over any controlled substance management, pending the results of ordered tests and/or consults.            Initial  impression: Pending review of available data and ordered tests.  Meds   Current Outpatient Medications:  .  ACCU-CHEK AVIVA PLUS test strip, USE AS DIRECTED THREE TIMES DAILY, Disp: 100 each, Rfl: 3 .  acetaminophen (TYLENOL) 325 MG tablet, Take 650 mg by mouth every 6 (six) hours as needed., Disp: , Rfl:  .  amLODipine (NORVASC) 5 MG tablet, Take 5 mg by mouth daily., Disp: , Rfl:  .  Ascorbic Acid (VITAMIN C) 1000 MG tablet, Take 1,000 mg by mouth daily., Disp: , Rfl:  .  B-D UF III MINI PEN NEEDLES 31G X 5 MM MISC, USE AS DIRECTED EVERY DAY, Disp: 100 each, Rfl: 0 .  Cholecalciferol (VITAMIN D3) 5000 units CAPS, Take by mouth. Daily, Disp: , Rfl:  .  furosemide (LASIX) 40 MG tablet, TK 1 T PO QD, Disp: , Rfl: 6 .  glucose blood test strip, Please dispence test strips and lancets of patient's choice.  Check sugars three times daily, Disp: , Rfl:  .  LANTUS SOLOSTAR 100 UNIT/ML Solostar Pen, ADMINISTER 34 UNITS UNDER THE SKIN DAILY AT 10 PM (Patient taking differently: 32-34 Units. ), Disp: 15 mL, Rfl: 12 .  losartan (COZAAR) 50 MG tablet, Take 100 mg by mouth daily. , Disp: , Rfl:  .  Multiple Vitamins-Minerals (PRESERVISION AREDS PO), Take by mouth., Disp: ,  Rfl:  .  DULoxetine (CYMBALTA) 20 MG capsule, Take 1 capsule (20 mg total) by mouth daily., Disp: 30 capsule, Rfl: 1  ROS  Cardiovascular: Weak heart (CHF) Pulmonary or Respiratory: No reported pulmonary signs or symptoms such as wheezing and difficulty taking a deep full breath (Asthma), difficulty blowing air out (Emphysema), coughing up mucus (Bronchitis), persistent dry cough, or temporary stoppage of breathing during sleep Neurological: No reported neurological signs or symptoms such as seizures, abnormal skin sensations, urinary and/or fecal incontinence, being born with an abnormal open spine and/or a tethered spinal cord Review of Past Neurological Studies:  Results for orders placed or performed during the hospital encounter of  12/12/17  CT HEAD WO CONTRAST   Narrative   CLINICAL DATA:  Recent fall with scalp hematoma, initial encounter  EXAM: CT HEAD WITHOUT CONTRAST  TECHNIQUE: Contiguous axial images were obtained from the base of the skull through the vertex without intravenous contrast.  COMPARISON:  07/20/2016  FINDINGS: Brain: Diffuse atrophic changes are identified. Mild chronic white matter ischemic change is seen. No findings to suggest acute hemorrhage, acute infarction or space-occupying mass lesion are noted.  Vascular: No hyperdense vessel or unexpected calcification.  Skull: Normal. Negative for fracture or focal lesion.  Sinuses/Orbits: No acute finding.  Other: Scalp hematoma is noted in the right posterior parietal region near the vertex consistent with the recent injury. It measures approximately 2.6 cm in greatest dimension.  IMPRESSION: Chronic atrophic and ischemic changes without acute intracranial abnormality  Scalp hematoma on the right is noted consistent with the physical exam.   Electronically Signed   By: Inez Catalina M.D.   On: 12/12/2017 18:46    Psychological-Psychiatric: No reported psychological or psychiatric signs or symptoms such as difficulty sleeping, anxiety, depression, delusions or hallucinations (schizophrenial), mood swings (bipolar disorders) or suicidal ideations or attempts Gastrointestinal: No reported gastrointestinal signs or symptoms such as vomiting or evacuating blood, reflux, heartburn, alternating episodes of diarrhea and constipation, inflamed or scarred liver, or pancreas or irrregular and/or infrequent bowel movements Genitourinary: No reported renal or genitourinary signs or symptoms such as difficulty voiding or producing urine, peeing blood, non-functioning kidney, kidney stones, difficulty emptying the bladder, difficulty controlling the flow of urine, or chronic kidney disease Hematological: No reported hematological signs or  symptoms such as prolonged bleeding, low or poor functioning platelets, bruising or bleeding easily, hereditary bleeding problems, low energy levels due to low hemoglobin or being anemic Endocrine: High blood sugar requiring insulin (IDDM) Rheumatologic: Generalized muscle aches (Fibromyalgia) Musculoskeletal: Negative for myasthenia gravis, muscular dystrophy, multiple sclerosis or malignant hyperthermia Work History: Retired  Allergies  Ms. Mcferran is allergic to ciprofloxacin; gluten meal; pioglitazone; and gabapentin.  Laboratory Chemistry  Inflammation Markers (CRP: Acute Phase) (ESR: Chronic Phase) Lab Results  Component Value Date   ESRSEDRATE 36 05/02/2016                         Rheumatology Markers No results found for: RF, ANA, LABURIC, URICUR, LYMEIGGIGMAB, LYMEABIGMQN, HLAB27                      Renal Function Markers Lab Results  Component Value Date   BUN 26 08/18/2018   CREATININE 1.26 (H) 08/18/2018   BCR 21 08/18/2018   GFRAA 44 (L) 08/18/2018   GFRNONAA 38 (L) 08/18/2018  Hepatic Function Markers Lab Results  Component Value Date   AST 16 04/15/2018   ALT 12 04/15/2018   ALBUMIN 4.0 04/15/2018   ALKPHOS 61 04/15/2018   LIPASE 17 12/17/2016                        Electrolytes Lab Results  Component Value Date   NA 142 08/18/2018   K 5.0 08/18/2018   CL 107 (H) 08/18/2018   CALCIUM 9.5 08/18/2018                        Neuropathy Markers Lab Results  Component Value Date   VITAMINB12 326 11/14/2016   HGBA1C 6.8 (H) 08/18/2018                        CNS Tests No results found for: COLORCSF, APPEARCSF, RBCCOUNTCSF, WBCCSF, POLYSCSF, LYMPHSCSF, EOSCSF, PROTEINCSF, GLUCCSF, JCVIRUS, CSFOLI, IGGCSF, LABACHR, ACETBL                      Bone Pathology Markers Lab Results  Component Value Date   VD25OH 18.8 (L) 11/14/2016                         Coagulation Parameters Lab Results  Component Value Date   PLT  173 08/18/2018                        Cardiovascular Markers Lab Results  Component Value Date   BNP 98 03/14/2015   TROPONINI <0.03 03/14/2015   HGB 12.7 08/18/2018   HCT 38.3 08/18/2018                         ID Markers No results found for: LYMEIGGIGMAB, HIV                      CA Markers Lab Results  Component Value Date   LABCA2 19.1 04/10/2012                        Endocrine Markers Lab Results  Component Value Date   TSH 2.070 04/15/2018                        Note: Lab results reviewed.  Imaging Review  Cervical Imaging:  Cervical CT wo contrast:  Results for orders placed during the hospital encounter of 07/20/16  CT Cervical Spine Wo Contrast   Narrative CLINICAL DATA:  85 y/o F; status post fall with injury to face. Complains of left-sided neck and chest pain. Bruise covering bridge of nose and forehead.  EXAM: CT HEAD WITHOUT CONTRAST  CT CERVICAL SPINE WITHOUT CONTRAST  TECHNIQUE: Multidetector CT imaging of the head and cervical spine was performed following the standard protocol without intravenous contrast. Multiplanar CT image reconstructions of the cervical spine were also generated.  COMPARISON:  None.  FINDINGS: CT HEAD FINDINGS  Soft tissue swelling over the left frontal scalp compatible with contusion. No displaced calvarial fracture.  Mild mucosal thickening of anterior ethmoid air cells otherwise visualized paranasal sinuses and mastoid air cells are clear. Orbits are unremarkable. Bilateral intra-ocular lens replacement.  No evidence of large acute infarct, mass effect, or intracranial hemorrhage. Mild parenchymal volume loss and chronic microvascular ischemic changes. Subcentimeter lipoma upon  the falx. No hyperdense vessel. Extensive calcific atherosclerosis of cavernous internal carotid arteries.  CT CERVICAL SPINE FINDINGS  Straightening of cervical lordosis with slight reversal at C5-6. Minimal anterolisthesis of  C5-6. No acute fracture or dislocation of the cervical spine is identified. Multilevel degenerative changes with marginal osteophytes and mild disc space narrowing. Prominent facet arthrosis. No high-grade bony canal stenosis or foraminal narrowing.  Clear lung apices. Mild calcific atherosclerosis of the left carotid bifurcation. Patent aerodigestive tract without appreciable exophytic mass. Normal thyroid gland. No lymphadenopathy is identified. Visualized parotid and submandibular glands are unremarkable. No prevertebral soft tissue swelling or fluid collection. Paraspinal muscles are unremarkable.  IMPRESSION: 1. No acute intracranial abnormality or skull fracture is identified. Left frontal scalp contusion. 2. No fracture or dislocation of the cervical spine is identified. 3. Mild chronic microvascular ischemic changes and parenchymal volume loss of the brain. 4. Multilevel degenerative changes of the cervical spine without high-grade bony canal stenosis or foraminal narrowing.   Electronically Signed   By: Kristine Garbe M.D.   On: 07/21/2016 00:04    Results for orders placed during the hospital encounter of 01/19/18  DG Shoulder Right   Narrative CLINICAL DATA:  Pt states she fell two weeks ago. Still having pain, limited movement right shoulder. Pain is top of shoulder  EXAM: RIGHT SHOULDER - 2+ VIEW  COMPARISON:  None.  FINDINGS: No fracture or bone lesion.  Glenohumeral joint is normally spaced and aligned.  Mild narrowing of the [facility] joint with small marginal osteophytes.  Bones are diffusely demineralized.  Soft tissues are unremarkable.  IMPRESSION: 1. No fracture or dislocation. 2. Mild AC joint osteoarthritis.   Electronically Signed   By: Lajean Manes M.D.   On: 01/19/2018 12:17     Lumbar DG (Complete) 4+V:  Results for orders placed during the hospital encounter of 12/05/17  DG Lumbar Spine Complete   Narrative CLINICAL DATA:   Hip pain. Prior history of breast cancer. No injury.  EXAM: LUMBAR SPINE - COMPLETE 4+ VIEW  COMPARISON:  MRI 01/23/2010.  FINDINGS: Diffuse osteopenia and degenerative change. Stable mild L3 on L4 anterolisthesis. Findings consistent with ankylosing spondylitis of the lumbar spine with bilateral sacroiliitis. No acute bony abnormality. Aortoiliac and visceral atherosclerotic vascular disease. Stool noted throughout the colon.  IMPRESSION: 1. Severe diffuse osteopenia and degenerative change. Stable mild L3 on L4 anterolisthesis. No acute bony abnormality.  2. Findings consistent with ankylosing spondylitis of the lumbar spine with bilateral sacroiliitis.  2.  Aortoiliac and visceral atherosclerotic vascular disease.   Electronically Signed   By: Marcello Moores  Register   On: 12/05/2017 12:45     Hip-L DG 2-3 views:  Results for orders placed during the hospital encounter of 12/05/17  DG Hip Unilat With Pelvis 2-3 Views Left   Narrative CLINICAL DATA:  Left hip pain.  No injury.  EXAM: DG HIP (WITH OR WITHOUT PELVIS) 2-3V LEFT  COMPARISON:  No prior.  FINDINGS: Degenerative changes lumbar spine, both SI joints, both hips. Diffuse osteopenia. No acute bony abnormality. Peripheral vascular calcification.  IMPRESSION: 1. Diffuse osteopenia and degenerative change. No acute bony abnormality identified.  2.  Peripheral vascular disease.   Electronically Signed   By: Marcello Moores  Register   On: 12/05/2017 12:40     Results for orders placed during the hospital encounter of 03/14/16  DG Foot Complete Right   Narrative CLINICAL DATA:  Dropped heavy object on right foot last week. Pain at mid fourth metatarsal area.  EXAM:  RIGHT FOOT COMPLETE - 3+ VIEW  COMPARISON:  04/10/2013  FINDINGS: Soft tissue swelling along the dorsum of the foot. No underlying bony abnormality. No fracture, subluxation or dislocation. Mild degenerative changes at the first MTP joint. Small  plantar calcaneal spur.  IMPRESSION: No acute bony abnormality.   Electronically Signed   By: Rolm Baptise M.D.   On: 03/14/2016 09:54     Wrist Imaging: Wrist-R DG Complete:  Results for orders placed during the hospital encounter of 07/20/16  DG Wrist Complete Right   Narrative CLINICAL DATA:  Pain and swelling after a fall.  EXAM: RIGHT WRIST - COMPLETE 3+ VIEW; RIGHT FOREARM - 2 VIEW  COMPARISON:  None.  FINDINGS: Right wrist: Transverse comminuted fractures of the distal right radial metaphysis with dorsal angulation and impaction of distal fracture fragments. Fracture lines appear to extend to the radial ulnar joint without apparent extension to the radiocarpal joint. There is an associated fracture of the ulnar styloid process. Soft tissue swelling about the right wrist. Degenerative changes noted in the interphalangeal joints.  Right forearm. Proximal and midshaft right radius and ulna appear otherwise intact. No additional fractures identified. No radiopaque soft tissue foreign bodies. No apparent dislocation of the elbow joint.  IMPRESSION: Transverse fractures of the distal right radial metaphysis with dorsal angulation and impaction of fracture fragments. Ulnar styloid process fracture. Soft tissue swelling. Right radius and ulna otherwise intact.   Electronically Signed   By: Lucienne Capers M.D.   On: 07/20/2016 23:46    Complexity Note: Imaging results reviewed. Results shared with Ms. Auth, using Layman's terms.                         [place]  Drug: Ms. Mikels  reports no history of drug use. Alcohol:  reports no history of alcohol use. Tobacco:  reports that she quit smoking about 67 years ago. Her smoking use included cigarettes. She has a 0.50 pack-year smoking history. She has never used smokeless tobacco. Medical:  has a past medical history of Cancer ([place]), Chronic kidney disease, Diabetes mellitus without complication ([place]), Gout,  Hyperlipidemia, Hypertension, Lymphedema, Obesity, Osteoporosis, Paget's disease of bone, and Spinal stenosis. Family: family history includes Cancer in her mother and sister; Diabetes in her brother and mother; Heart disease in her brother, brother, brother, and father; Throat cancer in her sister.  Past Surgical History:  Procedure Laterality Date  . ABDOMINAL HYSTERECTOMY    . JOINT REPLACEMENT  2000, 2006  . MASTECTOMY Left 2010  . ORIF WRIST FRACTURE  07/2016   Active Ambulatory Problems    Diagnosis Date Noted  . Obesity, Class III, BMI 40-49.9 (morbid obesity) ([place]) 05/02/2016  . Type 2 DM with CKD stage 4 and hypertension ([place]) 05/02/2016  . Vitamin D deficiency 05/02/2016  . Lower extremity edema 05/02/2016  . Spinal stenosis of lumbar region 05/02/2016  . Vitamin B12 deficiency 05/16/2016  . History of breast cancer 06/14/2011  . Essential (primary) hypertension 06/02/2012  . Generalized OA 06/14/2011  . Hyperlipidemia associated with type 2 diabetes mellitus ([place]) 06/02/2012  . Lymphedema of arm 06/14/2011  . Degeneration macular 05/23/2016  . OP (osteoporosis) 06/14/2011  . Osteitis deformans 05/23/2016  . Neoplasm of uncertain behavior of skin of face 05/23/2016  . Psoriasis 05/23/2016  . Chronic congestion of paranasal sinus 03/13/2017  . Venous stasis dermatitis of left lower extremity 07/16/2017  . Secondary hyperparathyroidism of renal origin ([place]) 10/16/2017  . Rotator cuff  disorder, right 04/15/2018  . Long term current use of insulin ([place]) 09/12/2018  . Lymphedema 09/17/2018  . Chronic pain syndrome 04/16/2019  . Fibromyalgia 04/16/2019  . Lumbar degenerative disc disease 04/16/2019  . Lumbar facet joint syndrome 04/16/2019  . Chronic SI joint pain 04/16/2019  . Ankylosing spondylitis of lumbosacral region ([place]) 04/16/2019  . CKD (chronic kidney disease) stage 3, GFR 30-59 ml/min (HCC) 04/16/2019   Resolved Ambulatory Problems    Diagnosis Date Noted  .  CKD (chronic kidney disease) stage 3, GFR 30-59 ml/min (HCC) 05/02/2016  . Bilateral impacted cerumen 07/16/2017   Past Medical History:  Diagnosis Date  . Cancer ([place])   . Chronic kidney disease   . Diabetes mellitus without complication ([place])   . Gout   . Hyperlipidemia   . Hypertension   . Obesity   . Osteoporosis   . Paget's disease of bone   . Spinal stenosis    Assessment  Primary Diagnosis & Pertinent Problem List: The primary encounter diagnosis was Chronic pain syndrome. Diagnoses of Fibromyalgia, Lumbar degenerative disc disease, Lumbar facet joint syndrome, Chronic SI joint pain, Ankylosing spondylitis of lumbosacral region ([place]), Obesity, Class III, BMI 40-49.9 (morbid obesity) ([place]), Vitamin D deficiency, Lower extremity edema, Spinal stenosis of lumbar region without neurogenic claudication, Long term current use of insulin ([place]), Lymphedema of arm, and Generalized OA were also pertinent to this visit.  Visit Diagnosis (New problems to examiner): 1. Chronic pain syndrome   2. Fibromyalgia   3. Lumbar degenerative disc disease   4. Lumbar facet joint syndrome   5. Chronic SI joint pain   6. Ankylosing spondylitis of lumbosacral region ([place])   7. Obesity, Class III, BMI 40-49.9 (morbid obesity) ([place])   8. Vitamin D deficiency   9. Lower extremity edema   10. Spinal stenosis of lumbar region without neurogenic claudication   11. Long term current use of insulin ([place])   12. Lymphedema of arm   13. Generalized OA    Plan of Care (Initial workup plan)  Note: Ms. Mendell was reminded that as per protocol, today's visit has been an evaluation only. We have not taken over the patient's controlled substance management.  Discussed medication management for fibromyalgia.  Patient has had issues with dizziness and balance in the past.  Would like to avoid sedative medications.  This includes muscle relaxants.  Avoid NSAIDs given stage3 CKD.  Consider Cymbalta.  Had discussion  with patient regarding risks and benefits of Cymbalta for management of her fibromyalgia.  Patient would like to try.  Prescription for 20 mg Cymbalta below.  Future considerations could include renally dose gabapentin and Lyrica.  Also like to obtain imaging studies of her lumbar spine and SI joints.  Pending results, patient could be a candidate for diagnostic lumbar facet medial branch nerve blocks as well as bilateral sacroiliac joint injection and possible radiofrequency ablation thereafter.  Will review imaging studies with patient's along with interventional treatment options at follow-up.  Patient may be a candidate for low-dose opioid therapy however will need UDS prior.  We will focus on non-opioid analgesics and interventional therapies beforehand.  Patient in agreement with plan.   Lab Orders     Compliance Drug Analysis, Ur  Imaging Orders     DG Lumbar Spine Complete W/Bend     DG Si Joints  Pharmacotherapy (current): Medications ordered:  Meds ordered this encounter  Medications  . DULoxetine (CYMBALTA) 20 MG capsule    Sig: Take 1 capsule (  20 mg total) by mouth daily.    Dispense:  30 capsule    Refill:  1   Medications administered during this visit: Heather Long had no medications administered during this visit.   Pharmacological management options:  Opioid Analgesics: The patient was informed that there is no guarantee that she would be a candidate for opioid analgesics. The decision will be made following CDC guidelines. This decision will be based on the results of diagnostic studies, as well as Ms. Lederman's risk profile.   Membrane stabilizer: To be determined at a later time trial of Cymbalta for fibromyalgia.  Can consider Lyrica/gabapentin (renally dosed)  Muscle relaxant: Not indicated  NSAID: Medically contraindicated  Other analgesic(s): To be determined at a later time   Interventional management options: Ms. Hickox was informed that there is  no guarantee that she would be a candidate for interventional therapies. The decision will be based on the results of diagnostic studies, as well as Ms. Grossi's risk profile.  Procedure(s) under consideration:  Lumbar facet medial branch nerve blocks Lumbar facet medial branch radiofrequency ablation Sacroiliac joint injection Lateral branch sacral nerve root diagnostic block   Provider-requested follow-up: Return in about 4 weeks (around 05/14/2019) for Medication Management, After Imaging.  No future appointments.  Total duration of non-face-to-face encounter: 30 minutes.  Primary Care Physician: No primary care provider on file. Location: [facility] Note by: Gillis Santa, MD Date: 04/16/2019; Time: 12:39 PM  Note: This dictation was prepared with Dragon dictation. Any transcriptional errors that may result from this process are unintentional.

## 2019-04-16 ENCOUNTER — Encounter: Payer: Self-pay | Admitting: Student in an Organized Health Care Education/Training Program

## 2019-04-16 ENCOUNTER — Ambulatory Visit
Payer: Medicare Other | Attending: Student in an Organized Health Care Education/Training Program | Admitting: Student in an Organized Health Care Education/Training Program

## 2019-04-16 ENCOUNTER — Other Ambulatory Visit: Payer: Self-pay

## 2019-04-16 DIAGNOSIS — M159 Polyosteoarthritis, unspecified: Secondary | ICD-10-CM

## 2019-04-16 DIAGNOSIS — M533 Sacrococcygeal disorders, not elsewhere classified: Secondary | ICD-10-CM

## 2019-04-16 DIAGNOSIS — G8929 Other chronic pain: Secondary | ICD-10-CM

## 2019-04-16 DIAGNOSIS — E559 Vitamin D deficiency, unspecified: Secondary | ICD-10-CM

## 2019-04-16 DIAGNOSIS — M797 Fibromyalgia: Secondary | ICD-10-CM

## 2019-04-16 DIAGNOSIS — M457 Ankylosing spondylitis of lumbosacral region: Secondary | ICD-10-CM

## 2019-04-16 DIAGNOSIS — N1832 Chronic kidney disease, stage 3b: Secondary | ICD-10-CM | POA: Insufficient documentation

## 2019-04-16 DIAGNOSIS — M5136 Other intervertebral disc degeneration, lumbar region: Secondary | ICD-10-CM | POA: Diagnosis not present

## 2019-04-16 DIAGNOSIS — N183 Chronic kidney disease, stage 3 unspecified: Secondary | ICD-10-CM | POA: Insufficient documentation

## 2019-04-16 DIAGNOSIS — M47816 Spondylosis without myelopathy or radiculopathy, lumbar region: Secondary | ICD-10-CM | POA: Diagnosis not present

## 2019-04-16 DIAGNOSIS — M48061 Spinal stenosis, lumbar region without neurogenic claudication: Secondary | ICD-10-CM

## 2019-04-16 DIAGNOSIS — M51369 Other intervertebral disc degeneration, lumbar region without mention of lumbar back pain or lower extremity pain: Secondary | ICD-10-CM

## 2019-04-16 DIAGNOSIS — R600 Localized edema: Secondary | ICD-10-CM

## 2019-04-16 DIAGNOSIS — Z794 Long term (current) use of insulin: Secondary | ICD-10-CM

## 2019-04-16 DIAGNOSIS — E66813 Obesity, class 3: Secondary | ICD-10-CM

## 2019-04-16 DIAGNOSIS — G894 Chronic pain syndrome: Secondary | ICD-10-CM | POA: Diagnosis not present

## 2019-04-16 DIAGNOSIS — I890 Lymphedema, not elsewhere classified: Secondary | ICD-10-CM

## 2019-04-16 MED ORDER — DULOXETINE HCL 20 MG PO CPEP: 20.0000 mg | ORAL_CAPSULE | Freq: Every day | ORAL | 1 refills | Status: DC

## 2019-04-30 ENCOUNTER — Ambulatory Visit
Admission: RE | Admit: 2019-04-30 | Discharge: 2019-04-30 | Disposition: A | Discharge status: Home / Self Care | Payer: Medicare Other | Source: Ambulatory Visit | Attending: Student in an Organized Health Care Education/Training Program | Admitting: Student in an Organized Health Care Education/Training Program

## 2019-04-30 DIAGNOSIS — M5136 Other intervertebral disc degeneration, lumbar region: Secondary | ICD-10-CM | POA: Insufficient documentation

## 2019-04-30 DIAGNOSIS — M51369 Other intervertebral disc degeneration, lumbar region without mention of lumbar back pain or lower extremity pain: Secondary | ICD-10-CM

## 2019-04-30 DIAGNOSIS — G8929 Other chronic pain: Secondary | ICD-10-CM | POA: Insufficient documentation

## 2019-04-30 DIAGNOSIS — M47816 Spondylosis without myelopathy or radiculopathy, lumbar region: Secondary | ICD-10-CM | POA: Insufficient documentation

## 2019-04-30 DIAGNOSIS — M533 Sacrococcygeal disorders, not elsewhere classified: Secondary | ICD-10-CM | POA: Insufficient documentation

## 2019-05-14 ENCOUNTER — Other Ambulatory Visit: Payer: Self-pay

## 2019-05-14 ENCOUNTER — Encounter: Payer: Self-pay | Admitting: Student in an Organized Health Care Education/Training Program

## 2019-05-14 ENCOUNTER — Ambulatory Visit
Payer: Medicare Other | Attending: Student in an Organized Health Care Education/Training Program | Admitting: Student in an Organized Health Care Education/Training Program

## 2019-05-14 DIAGNOSIS — M47816 Spondylosis without myelopathy or radiculopathy, lumbar region: Secondary | ICD-10-CM | POA: Insufficient documentation

## 2019-05-14 DIAGNOSIS — M533 Sacrococcygeal disorders, not elsewhere classified: Secondary | ICD-10-CM

## 2019-05-14 DIAGNOSIS — G8929 Other chronic pain: Secondary | ICD-10-CM | POA: Diagnosis not present

## 2019-05-14 DIAGNOSIS — M47818 Spondylosis without myelopathy or radiculopathy, sacral and sacrococcygeal region: Secondary | ICD-10-CM

## 2019-05-14 NOTE — Progress Notes (Signed)
Patient's Name: Heather Long  MRN: [id number]  Referring Provider: No ref. provider found  DOB: 10-01-1928  PCP: Sallee Lange, NP  DOS: 05/14/2019  Note by: Gillis Santa, MD  Service setting: Virtual Visit (Telephone)  Attending: Gillis Santa, MD  Location: Telephone Encounter  Specialty: Interventional Pain Management  Patient type: Established   Pain Management Virtual Encounter Note - Virtual Visit via Telephone Telehealth (real-time audio visits between healthcare provider and patient).   Patient's Phone No.:  [phone] (home); There is no such number on file (mobile).; (Preferred) [phone] [email or web address]  [id number] Shari Prows, [address] AT [address] [place] [address] Phone: [phone] Fax: [phone]    Pre-screening note:  Our staff contacted Ms. Robertshaw and offered her an "in person", "face-to-face" appointment versus a telephone encounter. She indicated preferring the telephone encounter, at this time.   Primary Reason(s) for Virtual Visit: Encounter for evaluation before starting new chronic pain management plan of care (Level of risk: moderate) COVID-19*  Social distancing based on CDC ans AMA recommendations.    I contacted [name] on 05/14/2019 via telephone.      I clearly identified myself as Gillis Santa, MD. I verified that I was speaking with the correct person using two identifiers (Name: Heather Long, and date of birth: [birth year]).  Advanced Informed Consent I sought verbal advanced consent from [name] for virtual visit interactions. I informed Ms. Caudell of possible security and privacy concerns, risks, and limitations associated with providing "not-in-person" medical evaluation and management services. I also informed Ms. Procter of the availability of "in-person" appointments. Finally, I informed her that there would be a charge for the  virtual visit and that she could be  personally, fully or partially, financially responsible for it. Ms. Kalb expressed understanding and agreed to proceed.   Historic Elements   [name] is a 85 y.o. year old, female patient evaluated today after her last encounter by our practice on 04/16/2019. [name]  has a past medical history of Cancer ([place]), Chronic kidney disease, Diabetes mellitus without complication ([place]), Gout, Hyperlipidemia, Hypertension, Lymphedema, Obesity, Osteoporosis, Paget's disease of bone, and Spinal stenosis. She also  has a past surgical history that includes Joint replacement (2000, 2006); Abdominal hysterectomy; ORIF wrist fracture (07/2016); and Mastectomy (Left, 2010). [name] has a current medication list which includes the following prescription(s): accu-chek aviva plus, acetaminophen, amlodipine, vitamin c, b-d uf iii mini pen needles, vitamin d3, duloxetine, furosemide, glucose blood, lantus solostar, losartan, and multiple vitamins-minerals. She  reports that she quit smoking about 67 years ago. Her smoking use included cigarettes. She has a 0.50 pack-year smoking history. She has never used smokeless tobacco. She reports that she does not drink alcohol or use drugs. [name] is allergic to ciprofloxacin; gluten meal; pioglitazone; and gabapentin.   HPI  She is being evaluated for review of studies ordered on initial visit and to consider treatment plan options. Today I went over the results of her tests. These were explained in "Layman's terms". During today's appointment I went over my diagnostic impression, as well as the proposed treatment plan.  X-ray results reviewed.  Lumbar spine x-ray show diffuse lumbar facet arthropathy and lumbar spondylosis and SI joint x-rays SI joint arthritis.  Discussed diagnostic lumbar facet medial branch nerve blocks with the patient states she will discuss with her daughter and let us know.  PRN order  placed.  Would like to avoid medication management.  Avoid NSAIDs given stage III chronic kidney disease.  Avoid muscle relaxants and centrally acting agents given risk of sedation and dizziness.  In considering the treatment plan options, [name] was reminded that I no longer take patients for medication management only. I asked her to let me know if she had no intention of taking advantage of the interventional therapies, so that we could make arrangements to provide this space to someone interested. I also made it clear that undergoing interventional therapies for the purpose of getting pain medications is very inappropriate on the part of a patient, and it will not be tolerated in this practice. This type of behavior would suggest true addiction and therefore it requires referral to an addiction specialist.   I discussed the assessment and treatment plan with the patient. The patient was provided an opportunity to ask questions and all were answered. The patient agreed with the plan and demonstrated an understanding of the instructions.  Patient advised to call back or seek an in-person evaluation if the symptoms or condition worsens.  Controlled Substance Pharmacotherapy Assessment REMS (Risk Evaluation and Mitigation Strategy)  Analgesic: Non-opioid therapy  Monitoring: [place] PMP: PDMP reviewed during this encounter.       Not applicable at this point since we have not taken over the patient's medication management yet. List of other Serum/Urine Drug Screening Test(s):  No results found for: AMPHSCRSER, [place], BENZOSCRSER, COCAINSCRSER, COCAINSCRNUR, [place], [place], [place], [place], [place], [place], [place], [place] List of all UDS test(s) done:  No results found for: TOXASSSELUR, SUMMARY Last UDS on record: No results found for: TOXASSSELUR, SUMMARY UDS interpretation: Not applicable.          Medication Assessment Form: Not applicable. Treatment compliance: Not  applicable Risk Assessment Profile: Aberrant behavior: See initial evaluations. None observed or detected today Comorbid factors increasing risk of overdose: See initial evaluation. No additional risks detected today Opioid risk tool (ORT):  Opioid Risk  04/15/2019  Alcohol 0  Illegal Drugs 0  Rx Drugs 0  Alcohol 0  Illegal Drugs 0  Rx Drugs 0  Age between 16-45 years  0  Psychological Disease 0  Depression 0  Opioid Risk Tool Scoring 0  Opioid Risk Interpretation Low Risk    ORT Scoring interpretation table:  Score <3 = Low Risk for SUD  Score between 4-7 = Moderate Risk for SUD  Score >8 = High Risk for Opioid Abuse   Risk of substance use disorder (SUD): Low  Risk Mitigation Strategies:  Patient opioid safety counseling: Not applicable. Patient-Prescriber Agreement (PPA): No agreement signed.  Controlled substance notification to other providers: Not applicable  Pharmacologic Plan: No opioid analgesic prescribed.             Meds   Current Outpatient Medications:  .  ACCU-CHEK AVIVA PLUS test strip, USE AS DIRECTED THREE TIMES DAILY, Disp: 100 each, Rfl: 3 .  acetaminophen (TYLENOL) 325 MG tablet, Take 650 mg by mouth every 6 (six) hours as needed., Disp: , Rfl:  .  amLODipine (NORVASC) 5 MG tablet, Take 5 mg by mouth daily., Disp: , Rfl:  .  Ascorbic Acid (VITAMIN C) 1000 MG tablet, Take 1,000 mg by mouth daily., Disp: , Rfl:  .  B-D UF III MINI PEN NEEDLES 31G X 5 MM MISC, USE AS DIRECTED EVERY DAY, Disp: 100 each, Rfl: 0 .  Cholecalciferol (VITAMIN D3) 5000 units CAPS, Take by mouth. Daily, Disp: , Rfl:  .  DULoxetine (CYMBALTA) 20 MG capsule, Take 1 capsule (20 mg total) by mouth daily., Disp: 30 capsule, Rfl: 1 .  furosemide (LASIX) 40 MG tablet, TK 1 T PO QD, Disp: , Rfl: 6 .  glucose blood test strip, Please dispence test strips and lancets of patient's choice.  Check sugars three times daily, Disp: , Rfl:  .  LANTUS SOLOSTAR 100 UNIT/ML Solostar Pen, ADMINISTER  34 UNITS UNDER THE SKIN DAILY AT 10 PM (Patient taking differently: 32-34 Units. ), Disp: 15 mL, Rfl: 12 .  losartan (COZAAR) 50 MG tablet, Take 100 mg by mouth daily. , Disp: , Rfl:  .  Multiple Vitamins-Minerals (PRESERVISION AREDS PO), Take by mouth., Disp: , Rfl:   Laboratory Chemistry   SAFETY SCREENING Profile No results found for: SARSCOV2NAA, COVIDSOURCE, STAPHAUREUS, MRSAPCR, HCVAB, HIV, PREGTESTUR Inflammation Markers (CRP: Acute Phase) (ESR: Chronic Phase) Lab Results  Component Value Date   ESRSEDRATE 36 05/02/2016                         Rheumatology Markers No results found for: RF, ANA, LABURIC, URICUR, LYMEIGGIGMAB, LYMEABIGMQN, HLAB27                      Renal Function Markers Lab Results  Component Value Date   BUN 26 08/18/2018   CREATININE 1.26 (H) 08/18/2018   BCR 21 08/18/2018   GFRAA 44 (L) 08/18/2018   GFRNONAA 38 (L) 08/18/2018                             Hepatic Function Markers Lab Results  Component Value Date   AST 16 04/15/2018   ALT 12 04/15/2018   ALBUMIN 4.0 04/15/2018   ALKPHOS 61 04/15/2018   LIPASE 17 12/17/2016                        Electrolytes Lab Results  Component Value Date   NA 142 08/18/2018   K 5.0 08/18/2018   CL 107 (H) 08/18/2018   CALCIUM 9.5 08/18/2018                        Neuropathy Markers Lab Results  Component Value Date   VITAMINB12 326 11/14/2016   HGBA1C 6.8 (H) 08/18/2018                        CNS Tests No results found for: COLORCSF, APPEARCSF, RBCCOUNTCSF, WBCCSF, POLYSCSF, LYMPHSCSF, EOSCSF, PROTEINCSF, GLUCCSF, JCVIRUS, CSFOLI, IGGCSF, LABACHR, ACETBL                      Bone Pathology Markers Lab Results  Component Value Date   VD25OH 18.8 (L) 11/14/2016                         Coagulation Parameters Lab Results  Component Value Date   PLT 173 08/18/2018                        Cardiovascular Markers Lab Results  Component Value Date   BNP 98 03/14/2015   TROPONINI <0.03  03/14/2015   HGB 12.7 08/18/2018   HCT 38.3 08/18/2018  ID Test(s) No results found for: LYMEIGGIGMAB, HIV, SARSCOV2NAA, STAPHAUREUS, MRSAPCR, HCVAB, PREGTESTUR, MICROTEXT  CA Markers Lab Results  Component Value Date   LABCA2 19.1 04/10/2012                        Endocrine Markers Lab Results  Component Value Date   TSH 2.070 04/15/2018                        Note: Lab results reviewed.  Recent Diagnostic Imaging Review  Cervical Imaging:  Results for orders placed during the hospital encounter of 07/20/16  CT Cervical Spine Wo Contrast   Narrative CLINICAL DATA:  85 y/o F; status post fall with injury to face. Complains of left-sided neck and chest pain. Bruise covering bridge of nose and forehead.  EXAM: CT HEAD WITHOUT CONTRAST  CT CERVICAL SPINE WITHOUT CONTRAST  TECHNIQUE: Multidetector CT imaging of the head and cervical spine was performed following the standard protocol without intravenous contrast. Multiplanar CT image reconstructions of the cervical spine were also generated.  COMPARISON:  None.  FINDINGS: CT HEAD FINDINGS  Soft tissue swelling over the left frontal scalp compatible with contusion. No displaced calvarial fracture.  Mild mucosal thickening of anterior ethmoid air cells otherwise visualized paranasal sinuses and mastoid air cells are clear. Orbits are unremarkable. Bilateral intra-ocular lens replacement.  No evidence of large acute infarct, mass effect, or intracranial hemorrhage. Mild parenchymal volume loss and chronic microvascular ischemic changes. Subcentimeter lipoma upon the falx. No hyperdense vessel. Extensive calcific atherosclerosis of cavernous internal carotid arteries.  CT CERVICAL SPINE FINDINGS  Straightening of cervical lordosis with slight reversal at C5-6. Minimal anterolisthesis of C5-6. No acute fracture or dislocation of the cervical spine is identified. Multilevel degenerative  changes with marginal osteophytes and mild disc space narrowing. Prominent facet arthrosis. No high-grade bony canal stenosis or foraminal narrowing.  Clear lung apices. Mild calcific atherosclerosis of the left carotid bifurcation. Patent aerodigestive tract without appreciable exophytic mass. Normal thyroid gland. No lymphadenopathy is identified. Visualized parotid and submandibular glands are unremarkable. No prevertebral soft tissue swelling or fluid collection. Paraspinal muscles are unremarkable.  IMPRESSION: 1. No acute intracranial abnormality or skull fracture is identified. Left frontal scalp contusion. 2. No fracture or dislocation of the cervical spine is identified. 3. Mild chronic microvascular ischemic changes and parenchymal volume loss of the brain. 4. Multilevel degenerative changes of the cervical spine without high-grade bony canal stenosis or foraminal narrowing.   Electronically Signed   By: Kristine Garbe M.D.   On: 07/21/2016 00:04     Results for orders placed during the hospital encounter of 01/19/18  DG Shoulder Right   Narrative CLINICAL DATA:  Pt states she fell two weeks ago. Still having pain, limited movement right shoulder. Pain is top of shoulder  EXAM: RIGHT SHOULDER - 2+ VIEW  COMPARISON:  None.  FINDINGS: No fracture or bone lesion.  Glenohumeral joint is normally spaced and aligned.  Mild narrowing of the [facility] joint with small marginal osteophytes.  Bones are diffusely demineralized.  Soft tissues are unremarkable.  IMPRESSION: 1. No fracture or dislocation. 2. Mild AC joint osteoarthritis.   Electronically Signed   By: Lajean Manes M.D.   On: 01/19/2018 12:17     Results for orders placed during the hospital encounter of 12/05/17  DG Lumbar Spine Complete   Narrative CLINICAL DATA:  Hip pain. Prior history of breast cancer. No injury.  EXAM: LUMBAR SPINE - COMPLETE 4+ VIEW  COMPARISON:  MRI  01/23/2010.  FINDINGS: Diffuse osteopenia and degenerative change. Stable mild L3 on L4 anterolisthesis. Findings consistent with ankylosing spondylitis of the lumbar spine with bilateral sacroiliitis. No acute bony abnormality. Aortoiliac and visceral atherosclerotic vascular disease. Stool noted throughout the colon.  IMPRESSION: 1. Severe diffuse osteopenia and degenerative change. Stable mild L3 on L4 anterolisthesis. No acute bony abnormality.  2. Findings consistent with ankylosing spondylitis of the lumbar spine with bilateral sacroiliitis.  2.  Aortoiliac and visceral atherosclerotic vascular disease.   Electronically Signed   By: Marcello Moores  Register   On: 12/05/2017 12:45          Lumbar DG F/E views: No results found for this or any previous visit.       Lumbar DG Bending views:  Results for orders placed during the hospital encounter of 04/30/19  DG Lumbar Spine Complete W/Bend   Narrative CLINICAL DATA:  Severe low back pain chronically.  No injury.  EXAM: LUMBAR SPINE - COMPLETE WITH BENDING VIEWS  COMPARISON:  12/05/2017  FINDINGS: Vertebral body heights are maintained. There is moderate spondylosis of the lumbar spine with mild disc space narrowing at all levels of the lumbar spine without significant change. Possible subtle grade 1 anterolisthesis of L3 on L4 unchanged and likely due to the moderate facet arthropathy. No evidence of acute compression fracture. No significant instability on flexion and extension.  IMPRESSION: No acute findings.  Moderate spondylosis of the lumbar spine with mild disc disease throughout the lumbar spine.  Possible mild grade 1 anterolisthesis of L3 on L4 which is stable.   Electronically Signed   By: Marin Olp M.D.   On: 04/30/2019 15:32           Sacroiliac Joint Imaging: Sacroiliac Joint DG:  Results for orders placed during the hospital encounter of 04/30/19  DG Si Joints   Narrative CLINICAL DATA:   Chronic low back pain.  EXAM: BILATERAL SACROILIAC JOINTS - 3+ VIEW  COMPARISON:  Plain films lumbar spine 12/05/2017.  FINDINGS: No acute abnormality is identified. There is degenerative disease about the SI joints bilaterally with some gas in the joints and mild subchondral sclerosis. The joints are not ankylosed. No erosion. No fracture scratch  IMPRESSION: No change in mild-to-moderate bilateral SI joint osteoarthritis. Otherwise negative.   Electronically Signed   By: Inge Rise M.D.   On: 04/30/2019 15:29    Hip-L DG 2-3 views:  Results for orders placed during the hospital encounter of 12/05/17  DG Hip Unilat With Pelvis 2-3 Views Left   Narrative CLINICAL DATA:  Left hip pain.  No injury.  EXAM: DG HIP (WITH OR WITHOUT PELVIS) 2-3V LEFT  COMPARISON:  No prior.  FINDINGS: Degenerative changes lumbar spine, both SI joints, both hips. Diffuse osteopenia. No acute bony abnormality. Peripheral vascular calcification.  IMPRESSION: 1. Diffuse osteopenia and degenerative change. No acute bony abnormality identified.  2.  Peripheral vascular disease.   Electronically Signed   By: Marcello Moores  Register   On: 12/05/2017 12:40     Foot Imaging: Foot-R DG Complete:  Results for orders placed during the hospital encounter of 03/14/16  DG Foot Complete Right   Narrative CLINICAL DATA:  Dropped heavy object on right foot last week. Pain at mid fourth metatarsal area.  EXAM: RIGHT FOOT COMPLETE - 3+ VIEW  COMPARISON:  04/10/2013  FINDINGS: Soft tissue swelling along the dorsum of the foot. No underlying bony abnormality. No fracture,  subluxation or dislocation. Mild degenerative changes at the first MTP joint. Small plantar calcaneal spur.  IMPRESSION: No acute bony abnormality.   Electronically Signed   By: Rolm Baptise M.D.   On: 03/14/2016 09:54     Wrist-R DG Complete:  Results for orders placed during the hospital encounter of 07/20/16  DG  Wrist Complete Right   Narrative CLINICAL DATA:  Pain and swelling after a fall.  EXAM: RIGHT WRIST - COMPLETE 3+ VIEW; RIGHT FOREARM - 2 VIEW  COMPARISON:  None.  FINDINGS: Right wrist: Transverse comminuted fractures of the distal right radial metaphysis with dorsal angulation and impaction of distal fracture fragments. Fracture lines appear to extend to the radial ulnar joint without apparent extension to the radiocarpal joint. There is an associated fracture of the ulnar styloid process. Soft tissue swelling about the right wrist. Degenerative changes noted in the interphalangeal joints.  Right forearm. Proximal and midshaft right radius and ulna appear otherwise intact. No additional fractures identified. No radiopaque soft tissue foreign bodies. No apparent dislocation of the elbow joint.  IMPRESSION: Transverse fractures of the distal right radial metaphysis with dorsal angulation and impaction of fracture fragments. Ulnar styloid process fracture. Soft tissue swelling. Right radius and ulna otherwise intact.   Electronically Signed   By: Lucienne Capers M.D.   On: 07/20/2016 23:46     Complexity Note: Imaging results reviewed. Results shared with Ms. Moroni, using Layman's terms.                         Assessment  The primary encounter diagnosis was Lumbar spondylosis. Diagnoses of Lumbar facet joint syndrome, Lumbar facet arthropathy, Chronic SI joint pain, and SI joint arthritis were also pertinent to this visit.  Satcha Storlie has a history of greater than 3 months of moderate to severe pain which is resulted in functional impairment.  The patient has tried various conservative therapeutic options such as NSAIDs, Tylenol, muscle relaxants, physical therapy which was inadequately effective.  Patient's pain is predominantly axial with radiographic and clinical findings suggestive of facet arthropathy.  Lumbar facet medial branch nerve blocks were discussed  with the patient.  Risks and benefits were reviewed.  Patient would like to proceed with bilateral L3, L4, L5,S1 medial branch nerve block.  Of note patient will discuss this with her daughter in greater detail.  I will place a PRN order if and when the patient would like to proceed.  If she or her daughter has any additional questions of instructed them to call clinic so that we can discuss.  Plan of Care  I am having [name] maintain her acetaminophen, vitamin C, Multiple Vitamins-Minerals (PRESERVISION AREDS PO), glucose blood, Lantus SoloStar, Vitamin D3, B-D UF III MINI PEN NEEDLES, losartan, Accu-Chek Aviva Plus, amLODipine, furosemide, and DULoxetine.   Procedure Orders     LUMBAR FACET(MEDIAL BRANCH NERVE BLOCK) MBNB   Orders:  Orders Placed This Encounter  Procedures  . LUMBAR FACET(MEDIAL BRANCH NERVE BLOCK) MBNB    Standing Status:   Standing    Number of Occurrences:   5    Standing Expiration Date:   05/13/2020    Scheduling Instructions:     Purpose: Diagnostic     Indication: Axial low back pain. Lumbosacral Spondylosis (M47.897).      Side: Bilateral     Level: L3-4, L4-5, & L5-S1 Facets (L3, L4, L5, & S1 Medial Branch Nerves)     Sedation: yes  TIMEFRAME: PRN procedure. (Ms. Novella will call when needed.)    Order Specific Question:   Where will this procedure be performed?    Answer:   [facility]   Interventional management options:  Considering:   Lumbar facet medial branch nerve block Lumbar facet radiofrequency ablation Bilateral sacroiliac joint injection    Total duration of non-face-to-face encounter:25 minutes.  Follow-up plan:   No follow-ups on file.    Recent Visits Date Type Provider Dept  04/16/19 Office Visit Gillis Santa, MD [facility]  Showing recent visits within past 90 days and meeting all other requirements   Today's Visits Date Type Provider Dept  05/14/19 Office Visit Gillis Santa, MD  [facility]  Showing today's visits and meeting all other requirements   Future Appointments No visits were found meeting these conditions.  Showing future appointments within next 90 days and meeting all other requirements   Primary Care Physician: Sallee Lange, NP Location: Telephone Virtual Visit Note by: Gillis Santa, MD Date: 05/14/2019; Time: 10:51 AM  Note: This dictation was prepared with Dragon dictation. Any transcriptional errors that may result from this process are unintentional.  Disclaimer:  * Given the special circumstances of the COVID-19 pandemic, the federal government has announced that the Office for Civil Rights (OCR) will exercise its enforcement discretion and will not impose penalties on physicians using telehealth in the event of noncompliance with regulatory requirements under the [place] (HIPAA) in connection with the good faith provision of telehealth during the LZJQB-34 national public health emergency. ([place])

## 2019-06-03 ENCOUNTER — Other Ambulatory Visit: Admission: RE | Admit: 2019-06-03 | Payer: Medicare Other | Source: Ambulatory Visit

## 2019-06-08 ENCOUNTER — Ambulatory Visit: Payer: Medicare Other | Admitting: Student in an Organized Health Care Education/Training Program

## 2019-06-15 ENCOUNTER — Other Ambulatory Visit
Admission: RE | Admit: 2019-06-15 | Discharge: 2019-06-15 | Disposition: A | Discharge status: Home / Self Care | Payer: Medicare Other | Source: Ambulatory Visit | Attending: Student in an Organized Health Care Education/Training Program | Admitting: Student in an Organized Health Care Education/Training Program

## 2019-06-15 ENCOUNTER — Encounter: Payer: Self-pay | Admitting: Student in an Organized Health Care Education/Training Program

## 2019-06-15 ENCOUNTER — Ambulatory Visit (HOSPITAL_BASED_OUTPATIENT_CLINIC_OR_DEPARTMENT_OTHER): Payer: Medicare Other | Admitting: Student in an Organized Health Care Education/Training Program

## 2019-06-15 ENCOUNTER — Other Ambulatory Visit: Payer: Self-pay

## 2019-06-15 VITALS — BP 152/75 | HR 79 | Temp 98.1°F | Ht 59.0 in | Wt 220.0 lb

## 2019-06-15 DIAGNOSIS — M797 Fibromyalgia: Secondary | ICD-10-CM | POA: Diagnosis not present

## 2019-06-15 DIAGNOSIS — G894 Chronic pain syndrome: Secondary | ICD-10-CM | POA: Insufficient documentation

## 2019-06-15 DIAGNOSIS — M549 Dorsalgia, unspecified: Secondary | ICD-10-CM | POA: Diagnosis present

## 2019-06-15 DIAGNOSIS — M457 Ankylosing spondylitis of lumbosacral region: Secondary | ICD-10-CM | POA: Diagnosis not present

## 2019-06-15 DIAGNOSIS — M47816 Spondylosis without myelopathy or radiculopathy, lumbar region: Secondary | ICD-10-CM | POA: Insufficient documentation

## 2019-06-15 DIAGNOSIS — M533 Sacrococcygeal disorders, not elsewhere classified: Secondary | ICD-10-CM | POA: Insufficient documentation

## 2019-06-15 DIAGNOSIS — M79606 Pain in leg, unspecified: Secondary | ICD-10-CM | POA: Insufficient documentation

## 2019-06-15 DIAGNOSIS — M5136 Other intervertebral disc degeneration, lumbar region: Secondary | ICD-10-CM

## 2019-06-15 DIAGNOSIS — G8929 Other chronic pain: Secondary | ICD-10-CM

## 2019-06-15 DIAGNOSIS — M47818 Spondylosis without myelopathy or radiculopathy, sacral and sacrococcygeal region: Secondary | ICD-10-CM

## 2019-06-15 LAB — URINE DRUG SCREEN, QUALITATIVE (ARMC ONLY)
Amphetamines, Ur Screen: NOT DETECTED
Barbiturates, Ur Screen: NOT DETECTED
Benzodiazepine, Ur Scrn: NOT DETECTED
Cannabinoid 50 Ng, Ur ~~LOC~~: NOT DETECTED
Cocaine Metabolite,Ur ~~LOC~~: NOT DETECTED
MDMA (Ecstasy)Ur Screen: NOT DETECTED
Methadone Scn, Ur: NOT DETECTED
Opiate, Ur Screen: NOT DETECTED
Phencyclidine (PCP) Ur S: NOT DETECTED
Tricyclic, Ur Screen: NOT DETECTED

## 2019-06-15 MED ORDER — ROPIVACAINE HCL 2 MG/ML IJ SOLN
2.0000 mL | Freq: Once | INTRAMUSCULAR | Status: DC
  Filled 2019-06-15: qty 10

## 2019-06-15 MED ORDER — DEXAMETHASONE SODIUM PHOSPHATE 10 MG/ML IJ SOLN
10.0000 mg | Freq: Once | INTRAMUSCULAR | Status: DC
  Filled 2019-06-15: qty 1

## 2019-06-15 MED ORDER — LIDOCAINE HCL 2 % IJ SOLN
20.0000 mL | Freq: Once | INTRAMUSCULAR | Status: DC
  Filled 2019-06-15: qty 40

## 2019-06-15 MED ORDER — HYDROCODONE-ACETAMINOPHEN 5-325 MG PO TABS: 1.0000 | ORAL_TABLET | Freq: Two times a day (BID) | ORAL | 0 refills | Status: DC | PRN

## 2019-06-15 MED ORDER — ROPIVACAINE HCL 2 MG/ML IJ SOLN
1.0000 mL | Freq: Once | INTRAMUSCULAR | Status: DC
  Filled 2019-06-15: qty 10

## 2019-06-15 NOTE — Progress Notes (Signed)
Patient's Name: Heather Long  MRN: [id number]  Referring Provider: Gillis Santa, MD  DOB: 1927/11/27  PCP: Sallee Lange, NP  DOS: 06/15/2019  Note by: Gillis Santa, MD  Service setting: Ambulatory outpatient  Attending: Gillis Santa, MD  Location: [facility] (AMB) Pain Management Facility  Specialty: Interventional Pain Management  Patient type: Established   CC: Back Pain  HPI: Heather Long is a 85 y.o. year old, female patient, who comes today complaining of Back Pain Her last contact with [place] was on 05/14/2019. I personally saw her on 05/14/2019. Severity of the pain is described as a 5 /10. Her pain is located in the area of the Back Lower and pain radiaties down both leg. Onset: More than a month ago. Pain is descriptors include: Aching, Burning, Throbbing. Tempo is described as: Constant. Modifying factors: nothing.  ROS: Positive for low back pain, buttock pain, leg pain, knee pain, muscle spasms, muscle aches.   Patient originally here for bilateral diagnostic lumbar facet medial branch nerve blocks for lumbar spondylosis and facet arthropathy.  Patient unable to tolerate lying prone on fluoroscopy table given extreme pain.  Patient not able to move forward with procedure.  We will focus primarily on medication management.  Will obtain serum toxicology screen as a patient states that she is unable to void.  Will prescribe hydrocodone as below.  Controlled Substance Pharmacotherapy Assessment REMS (Risk Evaluation and Mitigation Strategy)  Start hydrocodone 5 mg daily to twice daily as needed  Monitoring:  PMP: Online review of the past 67-month period conducted. Compliant with practice rules and regulations Last UDS on record: No results found for: SUMMARY UDS interpretation: obtain today          Medication Assessment Form: Not applicable. Initial evaluation. The patient has not received any medications from our practice Treatment compliance: Not applicable. Initial  evaluation for MM Risk Assessment Profile: Aberrant behavior: See initial evaluations. None observed or detected today Comorbid factors increasing risk of overdose: See initial evaluation. No additional risks detected today Opioid risk tool (ORT):  Opioid Risk  04/15/2019  Alcohol 0  Illegal Drugs 0  Rx Drugs 0  Alcohol 0  Illegal Drugs 0  Rx Drugs 0  Age between 16-45 years  0  Psychological Disease 0  Depression 0  Opioid Risk Tool Scoring 0  Opioid Risk Interpretation Low Risk    ORT Scoring interpretation table:  Score <3 = Low Risk for SUD  Score between 4-7 = Moderate Risk for SUD  Score >8 = High Risk for Opioid Abuse   Risk of substance use disorder (SUD): Low  Risk Mitigation Strategies:  Patient Counseling: Completed today. Counseling provided to patient as per "Patient Counseling Document". Document signed by patient, attesting to counseling and understanding Patient-Prescriber Agreement (PPA): Obtained today  Notification to other healthcare providers: Written and sent today  Pharmacologic Plan: Today we will take over the chronic pain medication management and from this point on our medication agreement with this patient is active.             Exam: Heather Long  height is 4\' 11"  (1.499 m) and weight is 220 lb (99.8 kg). Her temperature is 98.1 F (36.7 C). Her blood pressure is 152/75 (abnormal) and her pulse is 79. Her oxygen saturation is 98%.  Body mass index is 44.43 kg/m.   Cervical Spine Area Exam  Skin & Axial Inspection: No masses, redness, edema, swelling, or associated skin lesions Alignment: Symmetrical Functional ROM: Decreased ROM  Stability: No instability detected Muscle Tone/Strength: Functionally intact. No obvious neuro-muscular anomalies detected. Sensory (Neurological): Unimpaired   Lumbar Spine Area Exam  Skin & Axial Inspection: No masses, redness, or swelling Alignment: Symmetrical Functional ROM: Decreased ROM        Stability: No instability detected Muscle Tone/Strength: Functionally intact. No obvious neuro-muscular anomalies detected. Sensory (Neurological): Musculoskeletal pain pattern Palpation: Complains of area being tender to palpation       Provocative Tests: Hyperextension/rotation test: deferred today       Lumbar quadrant test (Kemp's test): deferred today       Lateral bending test: deferred today       Patrick's Maneuver: deferred today                   FABER* test: deferred today                   S-I anterior distraction/compression test: deferred today         S-I lateral compression test: deferred today         S-I Thigh-thrust test: deferred today         S-I Gaenslen's test: deferred today         *(Flexion, ABduction and External Rotation) Gait & Posture Assessment  Ambulation: Patient came in today in a wheel chair Gait: Significantly limited. Dependent on assistive device to ambulate Posture: Difficulty standing up straight, due to pain  Lower Extremity Exam    Side: Right lower extremity  Side: Left lower extremity  Stability: No instability observed          Stability: No instability observed          Skin & Extremity Inspection: Evidence of prior arthroplastic surgery  Skin & Extremity Inspection: Evidence of prior arthroplastic surgery  Functional ROM: Pain restricted ROM for all joints of the lower extremity          Functional ROM: Pain restricted ROM for all joints of the lower extremity          Muscle Tone/Strength: Functionally intact. No obvious neuro-muscular anomalies detected.  Muscle Tone/Strength: Functionally intact. No obvious neuro-muscular anomalies detected.  Sensory (Neurological): Musculoskeletal pain pattern        Sensory (Neurological): Musculoskeletal pain pattern        DTR: Patellar: 0: absent Achilles: 0: absent Plantar: deferred today  DTR: Patellar: 0: absent Achilles: 0: absent Plantar: deferred today  Palpation: Uncomfortable   Palpation: Uncomfortable   A/P: The primary encounter diagnosis was Chronic pain syndrome. Diagnoses of Lumbar spondylosis, Lumbar facet joint syndrome, Lumbar facet arthropathy, Chronic SI joint pain, Fibromyalgia, Lumbar degenerative disc disease, SI joint arthritis, and Ankylosing spondylitis of lumbosacral region [facility]) were also pertinent to this visit. I am having Heather Long start on HYDROcodone-acetaminophen. I am also having her maintain her acetaminophen, vitamin C, Multiple Vitamins-Minerals (PRESERVISION AREDS PO), glucose blood, Lantus SoloStar, Vitamin D3, B-D UF III MINI PEN NEEDLES, losartan, Accu-Chek Aviva Plus, amLODipine, furosemide, and DULoxetine. Return in about 4 weeks (around 07/13/2019) for Post Procedure Evaluation, virtual, Medication Management.   General Recommendations: The pain condition that the patient suffers from is best treated with a multidisciplinary approach that involves an increase in physical activity to prevent de-conditioning and worsening of the pain cycle, as well as psychological counseling (formal and/or informal) to address the co-morbid psychological affects of pain. Treatment will often involve judicious use of pain medications and interventional procedures to decrease the pain, allowing the patient to participate  in the physical activity that will ultimately produce long-lasting pain reductions. The goal of the multidisciplinary approach is to return the patient to a higher level of overall function and to restore their ability to perform activities of daily living.  Patient unable to tolerate positioning on table for diagnostic lumbar facet blocks due to severe discomfort and pain.  We will focus primarily on medication management.  1. Chronic pain syndrome   2. Lumbar spondylosis   3. Lumbar facet joint syndrome   4. Lumbar facet arthropathy   5. Chronic SI joint pain   6. Fibromyalgia   7. Lumbar degenerative disc disease   8. SI joint  arthritis   9. Ankylosing spondylitis of lumbosacral region [facility])    Requested Prescriptions   Signed Prescriptions Disp Refills  . HYDROcodone-acetaminophen (NORCO/VICODIN) 5-325 MG tablet 30 tablet 0    Sig: Take 1-2 tablets by mouth 2 (two) times daily as needed for severe pain. Must last 30 days.    Plan of Care  Orders:  Orders Placed This Encounter  Procedures  . Drug Screen 10 W/Conf, Serum   Chronic Opioid Analgesic:  HC 5 mg qday-BID prn #30/month Medications administered: Heather Long had no medications administered during this visit.  See the medical record for exact dosing, route, and time of administration.  Follow-up plan:   Return in about 4 weeks (around 07/13/2019) for Post Procedure Evaluation, virtual, Medication Management.     Recent Visits Date Type Provider Dept  05/14/19 Office Visit Gillis Santa, MD [facility]  04/16/19 Office Visit Gillis Santa, MD [facility]  Showing recent visits within past 90 days and meeting all other requirements   Today's Visits Date Type Provider Dept  06/15/19 Procedure visit Gillis Santa, MD [facility]  Showing today's visits and meeting all other requirements   Future Appointments Date Type Provider Dept  07/13/19 Appointment Gillis Santa, MD [facility]  Showing future appointments within next 90 days and meeting all other requirements   Disposition: Discharge home  Discharge Date & Time: 06/15/2019;   hrs.   Primary Care Physician: Sallee Lange, NP Location: [facility] Note by: Gillis Santa, MD Date: 06/15/2019; Time: 10:03 AM

## 2019-06-25 ENCOUNTER — Encounter: Payer: Self-pay | Admitting: Student in an Organized Health Care Education/Training Program

## 2019-06-29 ENCOUNTER — Ambulatory Visit
Payer: Medicare Other | Attending: Student in an Organized Health Care Education/Training Program | Admitting: Student in an Organized Health Care Education/Training Program

## 2019-06-29 ENCOUNTER — Other Ambulatory Visit: Payer: Self-pay

## 2019-06-29 ENCOUNTER — Encounter: Payer: Self-pay | Admitting: Student in an Organized Health Care Education/Training Program

## 2019-06-29 DIAGNOSIS — G8929 Other chronic pain: Secondary | ICD-10-CM

## 2019-06-29 DIAGNOSIS — M5136 Other intervertebral disc degeneration, lumbar region: Secondary | ICD-10-CM | POA: Diagnosis not present

## 2019-06-29 DIAGNOSIS — M533 Sacrococcygeal disorders, not elsewhere classified: Secondary | ICD-10-CM | POA: Diagnosis not present

## 2019-06-29 DIAGNOSIS — G894 Chronic pain syndrome: Secondary | ICD-10-CM

## 2019-06-29 DIAGNOSIS — M47816 Spondylosis without myelopathy or radiculopathy, lumbar region: Secondary | ICD-10-CM

## 2019-06-29 DIAGNOSIS — M47818 Spondylosis without myelopathy or radiculopathy, sacral and sacrococcygeal region: Secondary | ICD-10-CM

## 2019-06-29 DIAGNOSIS — M797 Fibromyalgia: Secondary | ICD-10-CM

## 2019-06-29 MED ORDER — HYDROCODONE-ACETAMINOPHEN 5-325 MG PO TABS: 1.0000 | ORAL_TABLET | Freq: Two times a day (BID) | ORAL | 0 refills | Status: DC | PRN

## 2019-06-29 NOTE — Progress Notes (Signed)
Pain Management Virtual Encounter Note - Virtual Visit via [place] (real-time audio visits between healthcare provider and patient).   Patient's Phone No. & Preferred Pharmacy:  [phone] (home); There is no such number on file (mobile).; (Preferred) [phone] [email or web address]  [id number] [address], [address] AT [place] [place] [address] Phone: [phone] Fax: [phone]    Pre-screening note:  Our staff contacted Heather Long and offered her an "in person", "face-to-face" appointment versus a telephone encounter. She indicated preferring the telephone encounter, at this time.   Reason for Virtual Visit: COVID-19*  Social distancing based on CDC and AMA recommendations.   I contacted Heather Long on 06/29/2019 via video conference.      I clearly identified myself as Gillis Santa, MD. I verified that I was speaking with the correct person using two identifiers (Name: Heather Long, and date of birth: 1927/11/27).  Advanced Informed Consent I sought verbal advanced consent from Heather Long for virtual visit interactions. I informed Heather Long of possible security and privacy concerns, risks, and limitations associated with providing "not-in-person" medical evaluation and management services. I also informed Heather Long of the availability of "in-person" appointments. Finally, I informed her that there would be a charge for the virtual visit and that she could be  personally, fully or partially, financially responsible for it. Heather Long expressed understanding and agreed to proceed.   Historic Elements   Heather Long is a 85 y.o. year old, female patient evaluated today after her last encounter by our practice on 06/15/2019. Heather Long  has a past medical history of Cancer ([place]), Chronic kidney disease, Diabetes mellitus without complication ([place]), Gout,  Hyperlipidemia, Hypertension, Lymphedema, Obesity, Osteoporosis, Paget's disease of bone, and Spinal stenosis. She also  has a past surgical history that includes Joint replacement (2000, 2006); Abdominal hysterectomy; ORIF wrist fracture (07/2016); and Mastectomy (Left, 2010). Heather Long has a current medication list which includes the following prescription(s): accu-chek aviva plus, amlodipine, b-d uf iii mini pen needles, vitamin d3, furosemide, glucose blood, hydrocodone-acetaminophen, lantus solostar, losartan, multiple vitamins-minerals, acetaminophen, vitamin c, and duloxetine. She  reports that she quit smoking about 67 years ago. Her smoking use included cigarettes. She has a 0.50 pack-year smoking history. She has never used smokeless tobacco. She reports that she does not drink alcohol or use drugs. Heather Long is allergic to ciprofloxacin; gluten meal; pioglitazone; and gabapentin.   HPI  Today, she is being contacted for medication management.   Patient is finding benefit with hydrocodone that she takes very sparingly in regards to her low back and knee pain.  She states that she did try taking 2 at once which made her disoriented.  I recommended that she only takes 1 as needed when she has severe pain.  Patient endorsed understanding.  At the last clinic visit, patient was scheduled to have diagnostic lumbar facet blocks but was unable to tolerate laying on the table.  She states that given her worsening low back pain she wants to retry this again and we discussed giving her IV analgesics i.e. very small dose of fentanyl prior to her getting on the table so that she can tolerate laying prone for the duration of the procedure.  Patient was asking if general anesthesia could be performed to do facet blocks which I said would not be a possibility.  Controlled Substance Pharmacotherapy Assessment REMS (Risk Evaluation and Mitigation Strategy)  Analgesic:  06/15/2019  1   06/15/2019   Hydrocodone-Acetamin 5-325 MG  30.00 7 Bi Lat   [id number]   [facility] (4612)   0  21.43 MME  Comm Ins   [place]    MME/day: 21 mg/day.  No notes on file Pharmacokinetics: Liberation and absorption (onset of action): WNL Distribution (time to peak effect): WNL Metabolism and excretion (duration of action): WNL         Pharmacodynamics: Desired effects: Analgesia: Heather Long reports >50% benefit. Functional ability: Patient reports that medication allows her to accomplish basic ADLs Clinically meaningful improvement in function (CMIF): Sustained CMIF goals met Perceived effectiveness: Described as relatively effective, allowing for increase in activities of daily living (ADL) Undesirable effects: Side-effects or Adverse reactions: None reported Monitoring: [place] PMP: Online review of the past 95-monthperiod conducted. Compliant with practice rules and regulations Last UDS on record: No results found for: SUMMARY UDS interpretation: Compliant          Medication Assessment Form: Reviewed. Patient indicates being compliant with therapy Treatment compliance: Compliant Risk Assessment Profile: Aberrant behavior: See initial evaluations. None observed or detected today Comorbid factors increasing risk of overdose: See initial evaluation. No additional risks detected today Opioid risk tool (ORT):  Opioid Risk  04/15/2019  Alcohol 0  Illegal Drugs 0  Rx Drugs 0  Alcohol 0  Illegal Drugs 0  Rx Drugs 0  Age between 16-45 years  0  Psychological Disease 0  Depression 0  Opioid Risk Tool Scoring 0  Opioid Risk Interpretation Low Risk    ORT Scoring interpretation table:  Score <3 = Low Risk for SUD  Score between 4-7 = Moderate Risk for SUD  Score >8 = High Risk for Opioid Abuse   Risk of substance use disorder (SUD): Low  Risk Mitigation Strategies:  Patient Counseling: Covered Patient-Prescriber Agreement (PPA): Present and active  Notification to other healthcare providers:  Done  Pharmacologic Plan: No change in therapy, at this time.            Increase quantity from #30 to #45.month  Imaging  Last 90 days:  Dg Lumbar Spine Complete W/bend  Result Date: 04/30/2019 CLINICAL DATA:  Severe low back pain chronically.  No injury. EXAM: LUMBAR SPINE - COMPLETE WITH BENDING VIEWS COMPARISON:  12/05/2017 FINDINGS: Vertebral body heights are maintained. There is moderate spondylosis of the lumbar spine with mild disc space narrowing at all levels of the lumbar spine without significant change. Possible subtle grade 1 anterolisthesis of L3 on L4 unchanged and likely due to the moderate facet arthropathy. No evidence of acute compression fracture. No significant instability on flexion and extension. IMPRESSION: No acute findings. Moderate spondylosis of the lumbar spine with mild disc disease throughout the lumbar spine. Possible mild grade 1 anterolisthesis of L3 on L4 which is stable. Electronically Signed   By: DMarin OlpM.D.   On: 04/30/2019 15:32   Dg Si Joints  Result Date: 04/30/2019 CLINICAL DATA:  Chronic low back pain. EXAM: BILATERAL SACROILIAC JOINTS - 3+ VIEW COMPARISON:  Plain films lumbar spine 12/05/2017. FINDINGS: No acute abnormality is identified. There is degenerative disease about the SI joints bilaterally with some gas in the joints and mild subchondral sclerosis. The joints are not ankylosed. No erosion. No fracture scratch IMPRESSION: No change in mild-to-moderate bilateral SI joint osteoarthritis. Otherwise negative. Electronically Signed   By: TInge RiseM.D.   On: 04/30/2019 15:29   Last Hospital Admission:  No results found. Assessment  The primary encounter diagnosis was Lumbar spondylosis. Diagnoses of Chronic pain syndrome, Lumbar facet joint syndrome, Lumbar facet arthropathy, Chronic SI joint pain, Lumbar degenerative disc disease, SI joint arthritis, and Fibromyalgia were also pertinent to this visit.  Heather Long has a history  of greater than 3 months of moderate to severe pain which is resulted in functional impairment.  The patient has tried various conservative therapeutic options such as NSAIDs, Tylenol, muscle relaxants, physical therapy which was inadequately effective.  Patient's pain is predominantly axial with physical exam findings suggestive of facet arthropathy.  Lumbar facet medial branch nerve blocks were discussed with the patient.  Risks and benefits were reviewed.  Patient would like to proceed with bilateral L3, L4, L5, S1 medial branch nerve block.  Of note patient was originally scheduled to have lumbar facet blocks at her last visit however patient was unable to tolerate prone positioning on the fluoroscopy table.  Patient would like to try and reattempt procedure however we discussed a game plan so that she can tolerate prone positioning on the fluoroscopy table.  After IV is placed, we can give the patient a very small amount of IV fentanyl to help decrease her pain so that she can tolerate procedural positioning.  Patient agreement with plan.   Plan of Care  I have changed Heather Long's HYDROcodone-acetaminophen. I am also having her maintain her acetaminophen, vitamin C, Multiple Vitamins-Minerals (PRESERVISION AREDS PO), glucose blood, Lantus SoloStar, Vitamin D3, B-D UF III MINI PEN NEEDLES, losartan, Accu-Chek Aviva Plus, amLODipine, furosemide, and DULoxetine.   Pharmacotherapy (Medications Ordered): Meds ordered this encounter  Medications  . HYDROcodone-acetaminophen (NORCO/VICODIN) 5-325 MG tablet    Sig: Take 1 tablet by mouth 2 (two) times daily as needed for severe pain.    Dispense:  45 tablet    Refill:  0    Chronic Pain. (STOP Act - Not applicable). Fill one day early if closed on scheduled refill date.   Orders:  Orders Placed This Encounter  Procedures  . LUMBAR FACET(MEDIAL BRANCH NERVE BLOCK) MBNB    Standing Status:   Future    Standing Expiration Date:   07/30/2019     Scheduling Instructions:     Side: Bilateral     Level: L3-4, L4-5, & L5-S1 Facets (L3, L4, L5, & S1 Medial Branch Nerves)     Sedation: WITH     Timeframe: ASAA    Order Specific Question:   Where will this procedure be performed?    Answer:   [facility]   Follow-up plan:   Return in about 2 weeks (around 07/13/2019) for Procedure B/L L3,4,5 S1 Facets w sedation .        Recent Visits Date Type Provider Dept  06/15/19 Procedure visit Gillis Santa, MD [facility]  05/14/19 Office Visit Gillis Santa, MD [facility]  04/16/19 Office Visit Gillis Santa, MD [facility]  Showing recent visits within past 90 days and meeting all other requirements   Today's Visits Date Type Provider Dept  06/29/19 Office Visit Gillis Santa, MD [facility]  Showing today's visits and meeting all other requirements   Future Appointments No visits were found meeting these conditions.  Showing future appointments within next 90 days and meeting all other requirements   I discussed the assessment and treatment plan with the patient. The patient was provided an opportunity to ask questions and all were answered. The patient agreed with the plan and demonstrated an understanding of the  instructions.  Patient advised to call back or seek an in-person evaluation if the symptoms or condition worsens.  Total duration of non-face-to-face encounter: 25 minutes.  Note by: Gillis Santa, MD Date: 06/29/2019; Time: 2:43 PM  Note: This dictation was prepared with Dragon dictation. Any transcriptional errors that may result from this process are unintentional.  Disclaimer:  * Given the special circumstances of the COVID-19 pandemic, the federal government has announced that the Office for Civil Rights (OCR) will exercise its enforcement discretion and will not impose penalties on physicians using telehealth in the event of noncompliance with regulatory  requirements under the [place] (HIPAA) in connection with the good faith provision of telehealth during the QHQIX-65 national public health emergency. ([place])

## 2019-07-09 ENCOUNTER — Telehealth: Payer: Self-pay | Admitting: Primary Care

## 2019-07-09 NOTE — Telephone Encounter (Signed)
Called patient to schedule Palliative Consult, no answer.  Left message with reason for call along with my name and contact information

## 2019-07-13 ENCOUNTER — Ambulatory Visit: Payer: Medicare Other | Admitting: Student in an Organized Health Care Education/Training Program

## 2019-07-23 ENCOUNTER — Ambulatory Visit: Payer: Medicare Other | Admitting: Pain Medicine

## 2019-07-29 ENCOUNTER — Other Ambulatory Visit: Payer: Self-pay

## 2019-07-29 ENCOUNTER — Ambulatory Visit (HOSPITAL_BASED_OUTPATIENT_CLINIC_OR_DEPARTMENT_OTHER): Payer: Medicare Other | Admitting: Student in an Organized Health Care Education/Training Program

## 2019-07-29 ENCOUNTER — Ambulatory Visit
Admission: RE | Admit: 2019-07-29 | Discharge: 2019-07-29 | Disposition: A | Discharge status: Home / Self Care | Payer: Medicare Other | Source: Ambulatory Visit | Attending: Student in an Organized Health Care Education/Training Program | Admitting: Student in an Organized Health Care Education/Training Program

## 2019-07-29 ENCOUNTER — Encounter: Payer: Self-pay | Admitting: Student in an Organized Health Care Education/Training Program

## 2019-07-29 VITALS — BP 158/53 | HR 75 | Temp 97.7°F | Resp 18 | Ht 59.0 in | Wt 214.0 lb

## 2019-07-29 DIAGNOSIS — M47816 Spondylosis without myelopathy or radiculopathy, lumbar region: Secondary | ICD-10-CM | POA: Diagnosis present

## 2019-07-29 MED ORDER — LIDOCAINE HCL 2 % IJ SOLN
20.0000 mL | Freq: Once | INTRAMUSCULAR | Status: AC
  Administered 2019-07-29: 13:00:00 400 mg
  Filled 2019-07-29: qty 20

## 2019-07-29 MED ORDER — FENTANYL CITRATE (PF) 100 MCG/2ML IJ SOLN
25.0000 ug | INTRAMUSCULAR | Status: DC | PRN
  Administered 2019-07-29: 25 ug via INTRAVENOUS
  Filled 2019-07-29: qty 2

## 2019-07-29 MED ORDER — HYDROCODONE-ACETAMINOPHEN 5-325 MG PO TABS: 1.0000 | ORAL_TABLET | Freq: Two times a day (BID) | ORAL | 0 refills | Status: DC | PRN

## 2019-07-29 MED ORDER — DEXAMETHASONE SODIUM PHOSPHATE 10 MG/ML IJ SOLN
10.0000 mg | Freq: Once | INTRAMUSCULAR | Status: AC
  Administered 2019-07-29: 13:00:00 10 mg
  Filled 2019-07-29: qty 1

## 2019-07-29 MED ORDER — ROPIVACAINE HCL 2 MG/ML IJ SOLN
2.0000 mL | Freq: Once | INTRAMUSCULAR | Status: AC
  Administered 2019-07-29: 13:00:00 2 mL via EPIDURAL
  Filled 2019-07-29: qty 10

## 2019-07-29 MED ORDER — ROPIVACAINE HCL 2 MG/ML IJ SOLN
1.0000 mL | Freq: Once | INTRAMUSCULAR | Status: AC
  Administered 2019-07-29: 13:00:00 1 mL via EPIDURAL
  Filled 2019-07-29: qty 10

## 2019-07-29 NOTE — Progress Notes (Signed)
Patient's Name: Heather Long  MRN: [id number]  Referring Provider: Sallee Lange, *  DOB: [birth year]  PCP: Sallee Lange, NP  DOS: 07/29/2019  Note by: Gillis Santa, MD  Service setting: Ambulatory outpatient  Specialty: Interventional Pain Management  Patient type: Established  Location: [facility] (AMB) Pain Management Facility  Visit type: Interventional Procedure   Primary Reason for Visit: Interventional Pain Management Treatment. CC: Back Pain (lower)  Procedure:          Anesthesia, Analgesia, Anxiolysis:  Type: Lumbar Facet, Medial Branch Block(s) #1  Primary Purpose: Diagnostic Region: Posterolateral Lumbosacral Spine Level: , L3, L4, L5, & S1 Medial Branch Level(s). Injecting these levels blocks the L3-4, L4-5, and L5-S1 lumbar facet joints. Laterality: Bilateral  Type: Moderate (Conscious) Sedation combined with Local Anesthesia Indication(s): Analgesia and Anxiety Route: Intravenous (IV) IV Access: Secured Sedation: Meaningful verbal contact was maintained at all times during the procedure  Local Anesthetic: Lidocaine 1-2%  Position: Prone   Indications: 1. Lumbar spondylosis   2. Lumbar facet joint syndrome    Pain Score: Pre-procedure: 5 /10 Post-procedure: 0-No pain/10   Pre-op Assessment:  Heather Long is a 85 y.o. (year old), female patient, seen today for interventional treatment. She  has a past surgical history that includes Joint replacement (2000, 2006); Abdominal hysterectomy; ORIF wrist fracture (07/2016); and Mastectomy (Left, 2010). Heather Long has a current medication list which includes the following prescription(s): accu-chek aviva plus, acetaminophen, amlodipine, b-d uf iii mini pen needles, vitamin d3, furosemide, glucose blood, hydrocodone-acetaminophen, lantus solostar, losartan, multiple vitamins-minerals, vitamin c, and duloxetine, and the following Facility-Administered Medications: fentanyl. Her primarily concern today is the Back  Pain (lower)  Initial Vital Signs:  Pulse/HCG Rate: 86  Temp: 98.4 F (36.9 C) Resp: 20 BP: (!) 180/68 SpO2: 100 %  BMI: Estimated body mass index is 43.22 kg/m as calculated from the following:   Height as of this encounter: 4\' 11"  (1.499 m).   Weight as of this encounter: 214 lb (97.1 kg).  Risk Assessment: Allergies: Reviewed. She is allergic to ciprofloxacin; gluten meal; pioglitazone; and gabapentin.  Allergy Precautions: None required Coagulopathies: Reviewed. None identified.  Blood-thinner therapy: None at this time Active Infection(s): Reviewed. None identified. Heather Long is afebrile  Site Confirmation: Heather Long was asked to confirm the procedure and laterality before marking the site Procedure checklist: Completed Consent: Before the procedure and under the influence of no sedative(s), amnesic(s), or anxiolytics, the patient was informed of the treatment options, risks and possible complications. To fulfill our ethical and legal obligations, as recommended by the American Medical Association's Code of Ethics, I have informed the patient of my clinical impression; the nature and purpose of the treatment or procedure; the risks, benefits, and possible complications of the intervention; the alternatives, including doing nothing; the risk(s) and benefit(s) of the alternative treatment(s) or procedure(s); and the risk(s) and benefit(s) of doing nothing. The patient was provided information about the general risks and possible complications associated with the procedure. These may include, but are not limited to: failure to achieve desired goals, infection, bleeding, organ or nerve damage, allergic reactions, paralysis, and death. In addition, the patient was informed of those risks and complications associated to Spine-related procedures, such as failure to decrease pain; infection (i.e.: Meningitis, epidural or intraspinal abscess); bleeding (i.e.: epidural hematoma,  subarachnoid hemorrhage, or any other type of intraspinal or peri-dural bleeding); organ or nerve damage (i.e.: Any type of peripheral nerve, nerve root, or spinal cord injury) with subsequent damage  to sensory, motor, and/or autonomic systems, resulting in permanent pain, numbness, and/or weakness of one or several areas of the body; allergic reactions; (i.e.: anaphylactic reaction); and/or death. Furthermore, the patient was informed of those risks and complications associated with the medications. These include, but are not limited to: allergic reactions (i.e.: anaphylactic or anaphylactoid reaction(s)); adrenal axis suppression; blood sugar elevation that in diabetics may result in ketoacidosis or comma; water retention that in patients with history of congestive heart failure may result in shortness of breath, pulmonary edema, and decompensation with resultant heart failure; weight gain; swelling or edema; medication-induced neural toxicity; particulate matter embolism and blood vessel occlusion with resultant organ, and/or nervous system infarction; and/or aseptic necrosis of one or more joints. Finally, the patient was informed that Medicine is not an exact science; therefore, there is also the possibility of unforeseen or unpredictable risks and/or possible complications that may result in a catastrophic outcome. The patient indicated having understood very clearly. We have given the patient no guarantees and we have made no promises. Enough time was given to the patient to ask questions, all of which were answered to the patient's satisfaction. Heather Long has indicated that she wanted to continue with the procedure. Attestation: I, the ordering provider, attest that I have discussed with the patient the benefits, risks, side-effects, alternatives, likelihood of achieving goals, and potential problems during recovery for the procedure that I have provided informed consent. Date   Time: 07/29/2019 11:00  AM  Pre-Procedure Preparation:  Monitoring: As per clinic protocol. Respiration, ETCO2, SpO2, BP, heart rate and rhythm monitor placed and checked for adequate function Safety Precautions: Patient was assessed for positional comfort and pressure points before starting the procedure. Time-out: I initiated and conducted the "Time-out" before starting the procedure, as per protocol. The patient was asked to participate by confirming the accuracy of the "Time Out" information. Verification of the correct person, site, and procedure were performed and confirmed by me, the nursing staff, and the patient. "Time-out" conducted as per Joint Commission's Universal Protocol (UP.01.01.01). Time: 1249  Description of Procedure:          Laterality: Bilateral. The procedure was performed in identical fashion on both sides. Levels:  L3, L4, L5, & S1 Medial Branch Level(s) Area Prepped: Posterior Lumbosacral Region Prepping solution: DuraPrep (Iodine Povacrylex [0.7% available iodine] and Isopropyl Alcohol, 74% w/w) Safety Precautions: Aspiration looking for blood return was conducted prior to all injections. At no point did we inject any substances, as a needle was being advanced. Before injecting, the patient was told to immediately notify me if she was experiencing any new onset of "ringing in the ears, or metallic taste in the mouth". No attempts were made at seeking any paresthesias. Safe injection practices and needle disposal techniques used. Medications properly checked for expiration dates. SDV (single dose vial) medications used. After the completion of the procedure, all disposable equipment used was discarded in the proper designated medical waste containers. Local Anesthesia: Protocol guidelines were followed. The patient was positioned over the fluoroscopy table. The area was prepped in the usual manner. The time-out was completed. The target area was identified using fluoroscopy. A 12-in long, straight,  sterile hemostat was used with fluoroscopic guidance to locate the targets for each level blocked. Once located, the skin was marked with an approved surgical skin marker. Once all sites were marked, the skin (epidermis, dermis, and hypodermis), as well as deeper tissues (fat, connective tissue and muscle) were infiltrated with a small amount  of a short-acting local anesthetic, loaded on a 10cc syringe with a 25G, 1.5-in  Needle. An appropriate amount of time was allowed for local anesthetics to take effect before proceeding to the next step. Local Anesthetic: Lidocaine 2.0% The unused portion of the local anesthetic was discarded in the proper designated containers. Technical explanation of process:   L3 Medial Branch Nerve Block (MBB): The target area for the L3 medial branch is at the junction of the postero-lateral aspect of the superior articular process and the superior, posterior, and medial edge of the transverse process of L4. Under fluoroscopic guidance, a Quincke needle was inserted until contact was made with os over the superior postero-lateral aspect of the pedicular shadow (target area). After negative aspiration for blood, 1 mL of the nerve block solution was injected without difficulty or complication. The needle was removed intact. L4 Medial Branch Nerve Block (MBB): The target area for the L4 medial branch is at the junction of the postero-lateral aspect of the superior articular process and the superior, posterior, and medial edge of the transverse process of L5. Under fluoroscopic guidance, a Quincke needle was inserted until contact was made with os over the superior postero-lateral aspect of the pedicular shadow (target area). After negative aspiration for blood, 7mL of the nerve block solution was injected without difficulty or complication. The needle was removed intact. L5 Medial Branch Nerve Block (MBB): The target area for the L5 medial branch is at the junction of the  postero-lateral aspect of the superior articular process and the superior, posterior, and medial edge of the sacral ala. Under fluoroscopic guidance, a Quincke needle was inserted until contact was made with os over the superior postero-lateral aspect of the pedicular shadow (target area). After negative aspiration for blood, 13mL of the nerve block solution was injected without difficulty or complication. The needle was removed intact. S1 Medial Branch Nerve Block (MBB): The target area for the S1 medial branch is at the posterior and inferior 6 o'clock position of the L5-S1 facet joint. Under fluoroscopic guidance, the Quincke needle inserted for the L5 MBB was redirected until contact was made with os over the inferior and postero aspect of the sacrum, at the 6 o' clock position under the L5-S1 facet joint (Target area). After negative aspiration for blood, 1 mL of the nerve block solution was injected without difficulty or complication. The needle was removed intact.  Nerve block solution: 8 cc solution made of 7 cc of 0.2% ropivacaine, 1 cc of Decadron 10 mg/cc. 1 cc injected at each level above bilaterally.  Procedural Needles: 22-gauge, 3.5-inch, Quincke needles used for all levels.  Once the entire procedure was completed, the treated area was cleaned, making sure to leave some of the prepping solution back to take advantage of its long term bactericidal properties.   Illustration of the posterior view of the lumbar spine and the posterior neural structures. Laminae of L2 through S1 are labeled. DPRL5, dorsal primary ramus of L5; DPRS1, dorsal primary ramus of S1; DPR3, dorsal primary ramus of L3; FJ, facet (zygapophyseal) joint L3-L4; I, inferior articular process of L4; LB1, lateral branch of dorsal primary ramus of L1; IAB, inferior articular branches from L3 medial branch (supplies L4-L5 facet joint); IBP, intermediate branch plexus; MB3, medial branch of dorsal primary ramus of L3; NR3, third  lumbar nerve root; S, superior articular process of L5; SAB, superior articular branches from L4 (supplies L4-5 facet joint also); TP3, transverse process of L3.  Vitals:  07/29/19 1305 07/29/19 1315 07/29/19 1325 07/29/19 1335  BP: (!) 121/98 (!) 153/59 122/85 (!) 158/53  Pulse: 78 75 78 75  Resp: 16 14 16 18   Temp:  (!) 97.5 F (36.4 C)  97.7 F (36.5 C)  TempSrc:      SpO2: 98% 100% 96% 100%  Weight:      Height:         Start Time: 1249 hrs. End Time: 1304 hrs.  Imaging Guidance (Spinal):          Type of Imaging Technique: Fluoroscopy Guidance (Spinal) Indication(s): Assistance in needle guidance and placement for procedures requiring needle placement in or near specific anatomical locations not easily accessible without such assistance. Exposure Time: Please see nurses notes. Contrast: None used. Fluoroscopic Guidance: I was personally present during the use of fluoroscopy. "Tunnel Vision Technique" used to obtain the best possible view of the target area. Parallax error corrected before commencing the procedure. "Direction-depth-direction" technique used to introduce the needle under continuous pulsed fluoroscopy. Once target was reached, antero-posterior, oblique, and lateral fluoroscopic projection used confirm needle placement in all planes. Images permanently stored in EMR. Interpretation: No contrast injected. I personally interpreted the imaging intraoperatively. Adequate needle placement confirmed in multiple planes. Permanent images saved into the patient's record.  Antibiotic Prophylaxis:   Anti-infectives (From admission, onward)   None     Indication(s): None identified  Post-operative Assessment:  Post-procedure Vital Signs:  Pulse/HCG Rate: 75  Temp: 97.7 F (36.5 C) Resp: 18 BP: (!) 158/53 SpO2: 100 %  EBL: None  Complications: No immediate post-treatment complications observed by team, or reported by patient.  Note: The patient tolerated the  entire procedure well. A repeat set of vitals were taken after the procedure and the patient was kept under observation following institutional policy, for this type of procedure. Post-procedural neurological assessment was performed, showing return to baseline, prior to discharge. The patient was provided with post-procedure discharge instructions, including a section on how to identify potential problems. Should any problems arise concerning this procedure, the patient was given instructions to immediately contact us, at any time, without hesitation. In any case, we plan to contact the patient by telephone for a follow-up status report regarding this interventional procedure.  Comments:  No additional relevant information.  Plan of Care  Orders:  Orders Placed This Encounter  Procedures   DG PAIN CLINIC C-ARM 1-60 MIN NO REPORT    Intraoperative interpretation by procedural physician at [place].    Standing Status:   Standing    Number of Occurrences:   1    Order Specific Question:   Reason for exam:    Answer:   Assistance in needle guidance and placement for procedures requiring needle placement in or near specific anatomical locations not easily accessible without such assistance.   Medications ordered for procedure: Meds ordered this encounter  Medications   lidocaine (XYLOCAINE) 2 % (with pres) injection 400 mg   fentaNYL (SUBLIMAZE) injection 25-50 mcg    Make sure Narcan is available in the pyxis when using this medication. In the event of respiratory depression (RR< 8/min): Titrate NARCAN (naloxone) in increments of 0.1 to 0.2 mg IV at 2-3 minute intervals, until desired degree of reversal.   dexamethasone (DECADRON) injection 10 mg   ropivacaine (PF) 2 mg/mL (0.2%) (NAROPIN) injection 1 mL   ropivacaine (PF) 2 mg/mL (0.2%) (NAROPIN) injection 2 mL   dexamethasone (DECADRON) injection 10 mg   HYDROcodone-acetaminophen (NORCO/VICODIN) 5-325 MG tablet  Sig: Take 1 tablet by mouth 2 (two) times daily as needed for severe pain.    Dispense:  45 tablet    Refill:  0    Chronic Pain. (STOP Act - Not applicable). Fill one day early if closed on scheduled refill date.   Medications administered: We administered lidocaine, fentaNYL, dexamethasone, ropivacaine (PF) 2 mg/mL (0.2%), ropivacaine (PF) 2 mg/mL (0.2%), and dexamethasone.  See the medical record for exact dosing, route, and time of administration.  Follow-up plan:   Return in about 5 weeks (around 09/02/2019) for Post Procedure Evaluation, virtual.      s/p diagnostic lumbar facets b/l L3,4,5 S1 on 07/29/19   Recent Visits Date Type Provider Dept  06/29/19 Office Visit Gillis Santa, MD [facility]  06/15/19 Procedure visit Gillis Santa, MD [facility]  05/14/19 Office Visit Gillis Santa, MD [facility]  Showing recent visits within past 90 days and meeting all other requirements   Today's Visits Date Type Provider Dept  07/29/19 Procedure visit Gillis Santa, MD [facility]  Showing today's visits and meeting all other requirements   Future Appointments Date Type Provider Dept  08/27/19 Appointment Gillis Santa, MD [facility]  Showing future appointments within next 90 days and meeting all other requirements   Disposition: Discharge home  Discharge Date & Time: 07/29/2019; 1338 hrs.   Primary Care Physician: Sallee Lange, NP Location: [facility] Note by: Gillis Santa, MD Date: 07/29/2019; Time: 2:22 PM  Disclaimer:  Medicine is not an exact science. The only guarantee in medicine is that nothing is guaranteed. It is important to note that the decision to proceed with this intervention was based on the information collected from the patient. The Data and conclusions were drawn from the patient's questionnaire, the interview, and the physical examination. Because the information was  provided in large part by the patient, it cannot be guaranteed that it has not been purposely or unconsciously manipulated. Every effort has been made to obtain as much relevant data as possible for this evaluation. It is important to note that the conclusions that lead to this procedure are derived in large part from the available data. Always take into account that the treatment will also be dependent on availability of resources and existing treatment guidelines, considered by other Pain Management Practitioners as being common knowledge and practice, at the time of the intervention. For Medico-Legal purposes, it is also important to point out that variation in procedural techniques and pharmacological choices are the acceptable norm. The indications, contraindications, technique, and results of the above procedure should only be interpreted and judged by a Board-Certified Interventional Pain Specialist with extensive familiarity and expertise in the same exact procedure and technique.

## 2019-07-29 NOTE — Patient Instructions (Signed)

## 2019-07-29 NOTE — Progress Notes (Signed)
Safety precautions to be maintained throughout the outpatient stay will include: orient to surroundings, keep bed in low position, maintain call bell within reach at all times, provide assistance with transfer out of bed and ambulation.  

## 2019-07-30 ENCOUNTER — Telehealth: Payer: Self-pay | Admitting: *Deleted

## 2019-07-30 NOTE — Telephone Encounter (Signed)
No problems post procedure. 

## 2019-08-06 ENCOUNTER — Telehealth: Payer: Self-pay | Admitting: Primary Care

## 2019-08-06 NOTE — Telephone Encounter (Signed)
Spoke with patient regarding Palliative services and she was in agreement with this.  I have scheduled a Telephone Consult for 08/10/19 @ 11:30 AM

## 2019-08-10 ENCOUNTER — Other Ambulatory Visit: Payer: Medicare Other | Admitting: Primary Care

## 2019-08-10 ENCOUNTER — Other Ambulatory Visit: Payer: Self-pay

## 2019-08-10 DIAGNOSIS — Z515 Encounter for palliative care: Secondary | ICD-10-CM

## 2019-08-10 NOTE — Progress Notes (Signed)
[facility] Consult Note Telephone: [phone]  Fax: [phone]  TELEHEALTH VISIT STATEMENT Due to the COVID-19 crisis, this visit was done via telemedicine from my office. It was initiated and consented to by this patient and/or family.  PATIENT NAME: Heather Long [address] [place] [address] [phone] (home)  DOB: May 06, 1928 MRN: [id number]  PRIMARY CARE PROVIDER:   Sallee Lange, NP, [address] [place] [address] [phone]  REFERRING PROVIDER:  Sallee Lange, NP [place],  [place] [address] [phone]  RESPONSIBLE PARTY:   Extended Emergency Contact Information Primary Emergency Contact: Tyna, Huertas Address: [address]          [place], [place] [address] [place] Phone: [phone] Relation: Son   ASSESSMENT AND RECOMMENDATIONS:   1. Advance Care Planning/Goals of Care: Goals include to maximize quality of life and symptom management. We did not discuss advance directives but will in our face to face meeting in 10 days.  2. Symptom Management:   Pain management:  I would recommend a SSRI to see if that is better tolerated, e.g. zoloft 25 mg x 2 weeks then increase to 50 mg, as well as ATC acetaminophen arthritis 650 mg q 8 hrs. Pain clinic was seeing for nerve block. Sees Dr. Holley Raring there. States her chronic pain is from arthritis. We discussed adjuvants but she said  Cymbalta gave her diarrhea.  She stopped it after a month and the diarrhea went away.  Lymphedema: States edema In legs, Lymphedema in Left arm from mastectomy. Would like to attend a  support group for breast cancer.  Has not had lymphedema pumps but uses compression stockings.  Functional Caregiving: Palliative SW referral for assessing available resources. States she cannot afford help, that she used to have a monthly cleaner but cant' pay that now. She also needs help with some  IADLS like laundry, cooking and maybe shopping. May qualify for homemaker services. Also gets food stamps but she states only $20/ month. 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  She does not go to any faith group or civic group.  Does drive to store. Has been in [place] 12 years ago, moved from [place]. Daughter in law was going to college in the area.  Will refer to SW for possible eligibility assessment for CAP or PCS.  3. Family /Caregiver/Community Supports: Has an ex husband who helps some financially. Son locally and helps some, another son out of the area. Daughter in law is a Nurse, learning disability at [employer].   4. Cognitive / Functional decline: Alert, oriented x 3. States she drives to the store, but cannot walk for exercise due to no area near her house. States she wants to remain in her home for the rest of her life.   5. Follow up Palliative Care Visit: Palliative care will continue to follow for goals of care clarification and symptom management. SW referral to palliative SW. Return 2 weeks or prn.  I spent 60 minutes providing this consultation,  from 1130 to 1230. More than 50% of the time in this consultation was spent coordinating communication.   HISTORY OF PRESENT ILLNESS:  Heather Long is a 85 y.o. year old female with multiple medical problems including obesity, DM, OA, spinal stenosis, h/o breast cancer and left mastectomy. Palliative Care was asked to follow this patient by consultation request of Gauger, Victoriano Lain, * to help address advance care planning and  goals of care. This is the initial  visit.  CODE STATUS: TBD  PPS: 50% HOSPICE ELIGIBILITY/DIAGNOSIS: TBD  PAST MEDICAL HISTORY:  Past Medical History:  Diagnosis Date  . Cancer ([place])   . Chronic kidney disease   . Diabetes mellitus without complication ([place])   . Gout   . Hyperlipidemia   . Hypertension   . Lymphedema   . Obesity   . Osteoporosis   . Paget's disease of bone   . Spinal stenosis      SOCIAL HX:  Social History   Tobacco Use  . Smoking status: Former Smoker    Packs/day: 0.25    Years: 2.00    Pack years: 0.50    Types: Cigarettes    Quit date: 01/02/1952    Years since quitting: 67.6  . Smokeless tobacco: Never Used  . Tobacco comment: smoking cessation materials not required  Substance Use Topics  . Alcohol use: No    ALLERGIES:  Allergies  Allergen Reactions  . Ciprofloxacin Nausea And Vomiting  . Gluten Meal Other (See Comments)    GI distress  . Pioglitazone Other (See Comments)    Leg swelling  . Gabapentin Diarrhea, Nausea Only and Other (See Comments)    dizziness     PERTINENT MEDICATIONS:  Outpatient Encounter Medications as of 08/10/2019  Medication Sig  . ACCU-CHEK AVIVA PLUS test strip USE AS DIRECTED THREE TIMES DAILY  . acetaminophen (TYLENOL) 325 MG tablet Take 650 mg by mouth every 6 (six) hours as needed.  Marland Kitchen amLODipine (NORVASC) 5 MG tablet Take 5 mg by mouth daily.  . Ascorbic Acid (VITAMIN C) 1000 MG tablet Take 1,000 mg by mouth daily.  . B-D UF III MINI PEN NEEDLES 31G X 5 MM MISC USE AS DIRECTED EVERY DAY  . Cholecalciferol (VITAMIN D3) 5000 units CAPS Take by mouth. Daily  . DULoxetine (CYMBALTA) 20 MG capsule Take 1 capsule (20 mg total) by mouth daily.  . furosemide (LASIX) 40 MG tablet TK 1 T PO QD  . glucose blood test strip Please dispence test strips and lancets of patient's choice.  Check sugars three times daily  . HYDROcodone-acetaminophen (NORCO/VICODIN) 5-325 MG tablet Take 1 tablet by mouth 2 (two) times daily as needed for severe pain.  Marland Kitchen LANTUS SOLOSTAR 100 UNIT/ML Solostar Pen ADMINISTER 34 UNITS UNDER THE SKIN DAILY AT 10 PM (Patient taking differently: 32-34 Units. )  . losartan (COZAAR) 50 MG tablet Take 100 mg by mouth daily.   . Multiple Vitamins-Minerals (PRESERVISION AREDS PO) Take by mouth.   No facility-administered encounter medications on file as of 08/10/2019.     PHYSICAL EXAM / ROS:   Current and  past weights: 214 lb per chart, BMI 43.22 General: chronic pain, frail per report, obese Cardiovascular: no chest pain reported, reports lymphdema in arm,  and edema in legs Pulmonary: no cough, no increased SOB Abdomen: appetite good, continent of bowel MSK:  no joint deformities, ambulatory with cane, drives Skin: no rashes or wounds reported Neurological: Weakness, chronic pain Endocrine: has been a diabetic for 32 years, thinks she may get a continuous blood glucose monitoring system.  Cyndia Skeeters DNP AGPCNP-BC

## 2019-08-20 ENCOUNTER — Telehealth: Payer: Self-pay

## 2019-08-20 NOTE — Telephone Encounter (Signed)
Visit rescheduled for 09/04/2019 @ 10:00 am

## 2019-08-21 ENCOUNTER — Other Ambulatory Visit: Payer: Medicare Other | Admitting: Primary Care

## 2019-08-26 ENCOUNTER — Telehealth: Payer: Self-pay | Admitting: Primary Care

## 2019-08-26 ENCOUNTER — Other Ambulatory Visit: Payer: Medicare Other | Admitting: Primary Care

## 2019-08-26 ENCOUNTER — Other Ambulatory Visit: Payer: Self-pay

## 2019-08-26 ENCOUNTER — Encounter: Payer: Self-pay | Admitting: Student in an Organized Health Care Education/Training Program

## 2019-08-26 DIAGNOSIS — Z515 Encounter for palliative care: Secondary | ICD-10-CM

## 2019-08-26 NOTE — Telephone Encounter (Signed)
I was asked by the NP to contact patient to see if we could change the time of the scheduled f/u visit for today to 1 PM -spoke with patient and she was in agreement with this.

## 2019-08-26 NOTE — Progress Notes (Signed)
[facility] Consult Note Telephone: [phone]  Fax: [phone]  PATIENT NAME: Heather Long [place] [address] [phone] (home)  DOB: 03-29-1928 MRN: [id number]  PRIMARY CARE PROVIDER:   Sallee Lange, NP, [address] [place] [address] [phone]  REFERRING PROVIDER:  Sallee Lange, NP [address] [place] [address] [phone]  RESPONSIBLE PARTY:   Extended Emergency Contact Information Primary Emergency Contact: Taj, Arteaga Address: [address]          [place],  [address] [place] Phone: [phone] Relation: Son   ASSESSMENT AND RECOMMENDATIONS:   1. Advance Care Planning/Goals of Care: Goals include to maximize quality of life and symptom management. MOST form left, she will discuss with her son. She wants to remain in her home but states she needs some help with adls and homemaking due to advancing age and debility. I will investigate her medicaid eligibility and discuss with SW W [place].  2. Symptom Management:   Pain: We discussed ATC tylenol again, she did not understand  last meeting to try ATC acetaminophen. She would like to start OTC time release acetaminophen. She has started sertraline, and has toldrated 25 mg well. She says it's very slight improvement. Recommend to go to 50 mg zoloft now, and then continuing to titrate to best effect, which may be even more.   Resources: Social services 707$ month. She has $400 from her ex-husband  But will lose that when he dies. He is 93.  Gets $ 90 for EBT but started getting more after covid, now $200.   Stamina: Has done PT in the past, after some falls. Fell in early 2019 and may have torn rotator cuff, and now is fine without surgery. She tries to get some exercise but has a lot of mobility limitations.  Lymphedema:  States she has left arm lymphedema, which is bothersome. Has used  pumps on her arm and has a sleeve. Still would like support group.  3. Family /Caregiver/Community Supports: Lives by self, gets some assistance from ex husband. Son is in area.  4. Cognitive / Functional decline: Alert and oriented x 3. Drives locally.  5. Follow up Palliative Care Visit: Palliative care will continue to follow for goals of care clarification and symptom management. Return 3-4 weeks or prn.  I spent 60 minutes providing this consultation,  from 1300 to 1400. More than 50% of the time in this consultation was spent coordinating communication.   HISTORY OF PRESENT ILLNESS:  Heather Long is a 85 y.o. year old female with multiple medical problems including obesity, DM, OA, spinal stenosis, h/o breast cancer and left mastectomy. Palliative Care was asked to follow this patient by consultation request of Gauger, Victoriano Lain, * to help address advance care planning and goals of care. This is a follow up visit.  CODE STATUS: TBD, MOST left PPS: 50% HOSPICE ELIGIBILITY/DIAGNOSIS: TBD  PAST MEDICAL HISTORY:  Past Medical History:  Diagnosis Date  . Cancer ([place])   . Chronic kidney disease   . Diabetes mellitus without complication ([place])   . Gout   . Hyperlipidemia   . Hypertension   . Lymphedema   . Obesity   . Osteoporosis   . Paget's disease of bone   . Spinal stenosis     SOCIAL HX:  Social History   Tobacco Use  . Smoking status: Former Smoker    Packs/day: 0.25  Years: 2.00    Pack years: 0.50    Types: Cigarettes    Quit date: 01/02/1952    Years since quitting: 67.6  . Smokeless tobacco: Never Used  . Tobacco comment: smoking cessation materials not required  Substance Use Topics  . Alcohol use: No    ALLERGIES:  Allergies  Allergen Reactions  . Ciprofloxacin Nausea And Vomiting  . Gluten Meal Other (See Comments)    GI distress  . Pioglitazone Other (See Comments)    Leg swelling  . Gabapentin Diarrhea, Nausea Only and Other (See  Comments)    dizziness     PERTINENT MEDICATIONS:  Outpatient Encounter Medications as of 08/26/2019  Medication Sig  . ACCU-CHEK AVIVA PLUS test strip USE AS DIRECTED THREE TIMES DAILY  . acetaminophen (TYLENOL) 325 MG tablet Take 650 mg by mouth every 6 (six) hours as needed.  Marland Kitchen amLODipine (NORVASC) 5 MG tablet Take 5 mg by mouth daily.  . Ascorbic Acid (VITAMIN C) 1000 MG tablet Take 1,000 mg by mouth daily.  . B-D UF III MINI PEN NEEDLES 31G X 5 MM MISC USE AS DIRECTED EVERY DAY  . Cholecalciferol (VITAMIN D3) 5000 units CAPS Take 50,000 Units by mouth every 7 (seven) days. Daily   . DULoxetine (CYMBALTA) 20 MG capsule Take 1 capsule (20 mg total) by mouth daily.  . furosemide (LASIX) 40 MG tablet TK 1 T PO QD  . glucose blood test strip Please dispence test strips and lancets of patient's choice.  Check sugars three times daily  . HYDROcodone-acetaminophen (NORCO/VICODIN) 5-325 MG tablet Take 1 tablet by mouth 2 (two) times daily as needed for severe pain.  Marland Kitchen LANTUS SOLOSTAR 100 UNIT/ML Solostar Pen ADMINISTER 34 UNITS UNDER THE SKIN DAILY AT 10 PM (Patient taking differently: 32-34 Units. )  . losartan (COZAAR) 50 MG tablet Take 100 mg by mouth daily.   . Multiple Vitamins-Minerals (PRESERVISION AREDS PO) Take by mouth.   No facility-administered encounter medications on file as of 08/26/2019.     PHYSICAL EXAM / ROS:   Current and past weights: 214 per chart General: NAD, frail appearing, morbid obesity Cardiovascular: no chest pain reported, LE lymphedema, S1S2  Pulmonary: no cough, no increased SOB, lungs clear Abdomen: appetite good to fair, endorses constipation, continent of bowel. Is diabetic. Runs around 112 fbs.  GU: denies dysuria, continent of urine MSK:  no joint deformities, ambulatory with walker. Is going to ask neighbor if she can walk with him when he walks his dog. LE with lymphedema. Skin: no rashes or wounds reported. Neurological: Weakness, alert and oriented  x 3  Heather Skeeters DNP AGPCNP-BC  COVID-19 PATIENT SCREENING TOOL  Person answering questions: ____________Self_______ _____   1.  Is the patient or any family member in the home showing any signs or symptoms regarding respiratory infection?               Person with Symptom- ______na_____________________  a. Fever                                                                          Yes___ No___          ___________________  b. Shortness of breath  Yes___ No___          ___________________ c. Cough/congestion                                       Yes___  No___         ___________________ d. Body aches/pains                                                         Yes___ No___        ____________________ e. Gastrointestinal symptoms (diarrhea, nausea)           Yes___ No___        ____________________  2. Within the past 14 days, has anyone living in the home had any contact with someone with or under investigation for COVID-19?    Yes___ No_x_   Person __________________

## 2019-08-27 ENCOUNTER — Ambulatory Visit
Payer: Medicare Other | Attending: Student in an Organized Health Care Education/Training Program | Admitting: Student in an Organized Health Care Education/Training Program

## 2019-08-27 ENCOUNTER — Other Ambulatory Visit: Payer: Self-pay

## 2019-08-27 ENCOUNTER — Telehealth: Payer: Self-pay | Admitting: Primary Care

## 2019-08-27 ENCOUNTER — Encounter: Payer: Self-pay | Admitting: Student in an Organized Health Care Education/Training Program

## 2019-08-27 DIAGNOSIS — M797 Fibromyalgia: Secondary | ICD-10-CM

## 2019-08-27 DIAGNOSIS — G894 Chronic pain syndrome: Secondary | ICD-10-CM

## 2019-08-27 DIAGNOSIS — M47816 Spondylosis without myelopathy or radiculopathy, lumbar region: Secondary | ICD-10-CM

## 2019-08-27 MED ORDER — HYDROCODONE-ACETAMINOPHEN 5-325 MG PO TABS: 1.0000 | ORAL_TABLET | Freq: Two times a day (BID) | ORAL | 0 refills | Status: AC | PRN

## 2019-08-27 NOTE — Telephone Encounter (Signed)
T/c to Heather Long, she gave me updated numbers for her son. T/c to son and daughter in law, they state they think medicaid lapsed and that she should still be eligible as her income has not changed.SW W Heather Long will call pt and coach her in steps to reactivate and to apply for CAP and PCS.  I also told son and DIL that Heather Long would like them to go over MOST with her and make ACP decisions. I told them my business cards were at her home and they could reach out anytime.

## 2019-08-27 NOTE — Progress Notes (Signed)
Pain Management Virtual Encounter Note - Virtual Visit via Telephone Telehealth (real-time audio visits between healthcare provider and patient).   Patient's Phone No. & Preferred Pharmacy:  [phone] (home); There is no such number on file (mobile).; (Preferred) [phone] [email or web address]  [id number], [place] [address] [place] [address] Phone: [phone] Fax: [phone]    Pre-screening note:  Our staff contacted [name] and offered her an "in person", "face-to-face" appointment versus a telephone encounter. She indicated preferring the telephone encounter, at this time.   Reason for Virtual Visit: COVID-19*  Social distancing based on CDC and AMA recommendations.   I contacted [name] on 08/27/2019 via telephone.      I clearly identified myself as Gillis Santa, MD. I verified that I was speaking with the correct person using two identifiers (Name: Heather Long, and date of birth: 1928/07/17).  Advanced Informed Consent I sought verbal advanced consent from [name] for virtual visit interactions. I informed [name] of possible security and privacy concerns, risks, and limitations associated with providing "not-in-person" medical evaluation and management services. I also informed [name] of the availability of "in-person" appointments. Finally, I informed her that there would be a charge for the virtual visit and that she could be  personally, fully or partially, financially responsible for it. [name] expressed understanding and agreed to proceed.   Historic Elements   [name] is a 85 y.o. year old, female patient evaluated today after her last encounter by our practice on 07/30/2019. [name]  has a past medical history of Cancer ([place]), Chronic kidney disease, Diabetes mellitus without complication ([place]), Gout, Hyperlipidemia,  Hypertension, Lymphedema, Obesity, Osteoporosis, Paget's disease of bone, and Spinal stenosis. She also  has a past surgical history that includes Joint replacement (2000, 2006); Abdominal hysterectomy; ORIF wrist fracture (07/2016); and Mastectomy (Left, 2010). [name] has a current medication list which includes the following prescription(s): accu-chek aviva plus, amlodipine, b-d uf iii mini pen needles, vitamin d3, furosemide, glucose blood, hydrocodone-acetaminophen, lantus solostar, losartan, multiple vitamins-minerals, sertraline, acetaminophen, vitamin c, and duloxetine. She  reports that she quit smoking about 67 years ago. Her smoking use included cigarettes. She has a 0.50 pack-year smoking history. She has never used smokeless tobacco. She reports that she does not drink alcohol or use drugs. Ms. Lieurance is allergic to ciprofloxacin; gluten meal; pioglitazone; and gabapentin.   HPI  Today, she is being contacted for both, medication management and a post-procedure assessment.  Evaluation of last interventional procedure  07/29/2019 Procedure:   Type: Lumbar Facet, Medial Branch Block(s) #1  Primary Purpose: Diagnostic Region: Posterolateral Lumbosacral Spine Level: , L3, L4, L5, & S1 Medial Branch Level(s). Injecting these levels blocks the L3-4, L4-5, and L5-S1 lumbar facet joints. Laterality: Bilateral  Influential Factors: Intra-procedural challenges: None observed.         Reported side-effects: None.        Post-procedural adverse reactions or complications: None reported         Sedation: Please see nurses note for DOS. When no sedatives are used, the analgesic levels obtained are directly associated to the effectiveness of the local anesthetics. However, when sedation is provided, the level of analgesia obtained during the initial 1 hour following the intervention, is believed to be the result of a combination of factors. These factors may include, but are not limited  to: 1. The effectiveness  of the local anesthetics used. 2. The effects of the analgesic(s) and/or anxiolytic(s) used. 3. The degree of discomfort experienced by the patient at the time of the procedure. 4. The patients ability and reliability in recalling and recording the events. 5. The presence and influence of possible secondary gains and/or psychosocial factors. Reported result: Relief experienced during the 1st hour after the procedure: 100%   (Ultra-Short Term Relief)            Interpretative annotation: Clinically appropriate result. Analgesia during this period is likely to be Local Anesthetic and/or IV Sedative (Analgesic/Anxiolytic) related.          Effects of local anesthetic: The analgesic effects attained during this period are directly associated to the localized infiltration of local anesthetics and therefore cary significant diagnostic value as to the etiological location, or anatomical origin, of the pain. Expected duration of relief is directly dependent on the pharmacodynamics of the local anesthetic used. Long-acting (4-6 hours) anesthetics used.  Reported result: Relief during the next 4 to 6 hour after the procedure: 100%   (Short-Term Relief)            Interpretative annotation: Clinically appropriate result. Analgesia during this period is likely to be Local Anesthetic-related.          Long-term benefit: Defined as the period of time past the expected duration of local anesthetics (1 hour for short-acting and 4-6 hours for long-acting). With the possible exception of prolonged sympathetic blockade from the local anesthetics, benefits during this period are typically attributed to, or associated with, other factors such as analgesic sensory neuropraxia, antiinflammatory effects, or beneficial biochemical changes provided by agents other than the local anesthetics.  Reported result: Extended relief following procedure: 100% for 2 weeks, now rated as approx 70% pain relief that  is on-going   (Long-Term Relief)            Interpretative annotation: Clinically appropriate result. Good relief. No permanent benefit expected. Inflammation plays a part in the etiology to the pain.           Repeat PRN  Pharmacotherapy Assessment  Analgesic:HC 5 mg qday-BID prn   Monitoring: Pharmacotherapy: No side-effects or adverse reactions reported.  PMP: PDMP reviewed during this encounter.       Compliance: No problems identified. Effectiveness: Clinically acceptable. Plan: Refer to "POC".  Laboratory Chemistry Profile (12 mo)  Renal: No results found for requested labs within last 8760 hours.  Lab Results  Component Value Date   GFRAA 44 (L) 08/18/2018   GFRNONAA 38 (L) 08/18/2018   Hepatic: No results found for requested labs within last 8760 hours. Lab Results  Component Value Date   AST 16 04/15/2018   ALT 12 04/15/2018   Other: No results found for requested labs within last 8760 hours. Note: Above Lab results reviewed.    Assessment  The primary encounter diagnosis was Lumbar spondylosis. Diagnoses of Lumbar facet joint syndrome, Lumbar facet arthropathy, Fibromyalgia, and Chronic pain syndrome were also pertinent to this visit.  Plan of Care  I am having [name] maintain her acetaminophen, vitamin C, Multiple Vitamins-Minerals (PRESERVISION AREDS PO), glucose blood, Lantus SoloStar, Vitamin D3, B-D UF III MINI PEN NEEDLES, losartan, Accu-Chek Aviva Plus, amLODipine, furosemide, DULoxetine, sertraline, and HYDROcodone-acetaminophen.  1. Lumbar spondylosis -Status post diagnostic bilateral L3, L4, L5, S1 lumbar facet medial branch nerve blocks on 07/29/2019 which provided her with significant pain relief in regards to her axial low back pain.  Repeat PRN -  LUMBAR FACET(MEDIAL BRANCH NERVE BLOCK) MBNB; Standing  2. Lumbar facet joint syndrome As above - LUMBAR FACET(MEDIAL BRANCH NERVE BLOCK) MBNB; Standing  3. Lumbar facet arthropathy As  above  4. Fibromyalgia -Continue Cymbalta 20 mg daily  5. Chronic pain syndrome -Continue Cymbalta, PRN lumbar facet medial branch nerve block - HYDROcodone-acetaminophen (NORCO/VICODIN) 5-325 MG tablet; Take 1 tablet by mouth 2 (two) times daily as needed for severe pain.  Dispense: 60 tablet; Refill: 0   Pharmacotherapy (Medications Ordered): Meds ordered this encounter  Medications  . HYDROcodone-acetaminophen (NORCO/VICODIN) 5-325 MG tablet    Sig: Take 1 tablet by mouth 2 (two) times daily as needed for severe pain.    Dispense:  60 tablet    Refill:  0    Chronic Pain. (STOP Act - Not applicable). Fill one day early if closed on scheduled refill date.   Orders:  Orders Placed This Encounter  Procedures  . LUMBAR FACET(MEDIAL BRANCH NERVE BLOCK) MBNB    Standing Status:   Standing    Number of Occurrences:   5    Standing Expiration Date:   08/26/2020    Scheduling Instructions:     Purpose: Diagnostic     Indication: Axial low back pain. Lumbosacral Spondylosis (M47.897).      Side: Bilateral     Level: L3-4, L4-5, & L5-S1 Facets ( L3, L4, L5, & S1 Medial Branch Nerves)     Sedation: With Sedation.     TIMEFRAME: PRN procedure. (Ms. Brendlinger will call when needed.)    Order Specific Question:   Where will this procedure be performed?    Answer:   [facility]   Follow-up plan:   Return in about 6 months (around 02/25/2020) for Medication Management, in person.     s/p diagnostic lumbar facets b/l L3,4,5 S1 on 07/29/19- helped repeat PRN    Recent Visits Date Type Provider Dept  07/29/19 Procedure visit Gillis Santa, MD [facility]  06/29/19 Office Visit Gillis Santa, MD [facility]  06/15/19 Procedure visit Gillis Santa, MD [facility]  Showing recent visits within past 90 days and meeting all other requirements   Today's Visits Date Type Provider Dept  08/27/19 Office Visit Gillis Santa, MD [facility]  Showing  today's visits and meeting all other requirements   Future Appointments No visits were found meeting these conditions.  Showing future appointments within next 90 days and meeting all other requirements   I discussed the assessment and treatment plan with the patient. The patient was provided an opportunity to ask questions and all were answered. The patient agreed with the plan and demonstrated an understanding of the instructions.  Patient advised to call back or seek an in-person evaluation if the symptoms or condition worsens.  Total duration of non-face-to-face encounter: 25 minutes.  Note by: Gillis Santa, MD Date: 08/27/2019; Time: 11:09 AM  Note: This dictation was prepared with Dragon dictation. Any transcriptional errors that may result from this process are unintentional.  Disclaimer:  * Given the special circumstances of the COVID-19 pandemic, the federal government has announced that the Office for Civil Rights (OCR) will exercise its enforcement discretion and will not impose penalties on physicians using telehealth in the event of noncompliance with regulatory requirements under the [place] (HIPAA) in connection with the good faith provision of telehealth during the TDHRC-16 national public health emergency. ([place])

## 2019-09-03 ENCOUNTER — Telehealth: Payer: Self-pay

## 2019-09-03 NOTE — Telephone Encounter (Signed)
SW contacted patient to follow-up on questions concerning Medicaid. Patient said that she had Medicaid coverage in the past but did not "renew". SW encouraged patient to reapply. SW provided patient with [place] number and told patient to call and request a Medicaid application be mailed to her. Patient agreed. Patient also plans to discuss with son and daughter-in-law and may consider applying online. SW provided contact information for any additional questions/concerns.

## 2019-09-04 ENCOUNTER — Other Ambulatory Visit: Payer: Medicare Other | Admitting: Primary Care

## 2019-10-22 ENCOUNTER — Telehealth: Payer: Self-pay | Admitting: Primary Care

## 2019-10-22 NOTE — Telephone Encounter (Signed)
T/c to set up next palliative visit. Spoke with Mrs. Fontaine No. Appt made for 10/29/19 at 10 am.

## 2019-10-29 ENCOUNTER — Other Ambulatory Visit: Payer: Medicare Other | Admitting: Primary Care

## 2019-10-29 ENCOUNTER — Other Ambulatory Visit: Payer: Self-pay

## 2019-10-29 DIAGNOSIS — Z515 Encounter for palliative care: Secondary | ICD-10-CM

## 2019-10-29 NOTE — Progress Notes (Signed)
[facility] Consult Note Telephone: [phone]  Fax: [phone]    PATIENT NAME: Heather Long [id number] [phone] (home)  DOB: [birth year] MRN: [id number]  PRIMARY CARE PROVIDER:   Sallee Lange, NP, [address] [place] [address] [phone]  REFERRING PROVIDER:  Sallee Lange, NP [address] [place],  [place] [address] [phone]  RESPONSIBLE PARTY:   Extended Emergency Contact Information Primary Emergency Contact: Heather, Long Address: [address]          [place],  [address] Heather Long of [place] Phone: [phone] Mobile Phone: [phone] Relation: Son   ASSESSMENT AND RECOMMENDATIONS:   1. Advance Care Planning/Goals of Care: Goals include to maximize quality of life and symptom management. We discussed MOST form and she said her daughter in law would help her with this. She also is going to re new her medicaid. We discussed this in Oct. But it has not been applied for yet. I encouraged her to re apply as she likely just let it lapse and is eligible.   2. Symptom Management:   Depression: Sertraline at 25 mg and states it helps at this dose. She states it has been a hard year with the loss of her last 2 siblings. She enjoys visits when people can come by. She anticipate her 89th birthday in a few months.  Nutrition: states she eats low sodium and vegetables. Endorses avoiding carbohydrates more. She has lost steadily a pound a week roughly since May. Her FBS is 100-115.   3. Family /Caregiver/Community Supports: Has son and DIL in area. They help her with some of her IADLs. She does drive to store for groceries, and to MD office.  4. Cognitive / Functional decline: Improved function, able to ambulate more easily. States better cognition and mood. Has a new tub bench which allows her more mobility. She has 3 rollators which she uses.   5.  Follow up Palliative Care Visit: Palliative care will continue to follow for goals of care clarification and symptom management. Return 8 weeks or prn.  I spent 60 minutes providing this consultation,  from 1030 to 1130. More than 50% of the time in this consultation was spent coordinating communication.   HISTORY OF PRESENT ILLNESS:  Heather Long is a 85 y.o. year old female with multiple medical problems including DM,CKD 3 obesity. Palliative Care was asked to follow this patient by consultation request of Heather Long, Heather Long, * to help address advance care planning and goals of care. This is a follow up visit.  CODE STATUS: TBD  PPS: 60% HOSPICE ELIGIBILITY/DIAGNOSIS: no PAST MEDICAL HISTORY:  Past Medical History:  Diagnosis Date  . Cancer ([place])   . Chronic kidney disease   . Diabetes mellitus without complication ([place])   . Gout   . Hyperlipidemia   . Hypertension   . Lymphedema   . Obesity   . Osteoporosis   . Paget's disease of bone   . Spinal stenosis     SOCIAL HX:  Social History   Tobacco Use  . Smoking status: Former Smoker    Packs/day: 0.25    Years: 2.00    Pack years: 0.50    Types: Cigarettes    Quit date: 01/02/1952    Years since quitting: 67.8  . Smokeless tobacco: Never Used  . Tobacco comment: smoking cessation materials not required  Substance Use Topics  . Alcohol use: No  ALLERGIES:  Allergies  Allergen Reactions  . Ciprofloxacin Nausea And Vomiting  . Gluten Meal Other (See Comments)    GI distress  . Pioglitazone Other (See Comments)    Leg swelling  . Gabapentin Diarrhea, Nausea Only and Other (See Comments)    dizziness     PERTINENT MEDICATIONS:  Outpatient Encounter Medications as of 10/29/2019  Medication Sig  . ACCU-CHEK AVIVA PLUS test strip USE AS DIRECTED THREE TIMES DAILY  . acetaminophen (TYLENOL) 325 MG tablet Take 650 mg by mouth every 6 (six) hours as needed.  Marland Kitchen amLODipine (NORVASC) 5 MG tablet Take 5 mg by  mouth daily.  . Ascorbic Acid (VITAMIN C) 1000 MG tablet Take 1,000 mg by mouth daily.  . B-D UF III MINI PEN NEEDLES 31G X 5 MM MISC USE AS DIRECTED EVERY DAY  . Cholecalciferol (VITAMIN D3) 5000 units CAPS Take 50,000 Units by mouth every 7 (seven) days. Daily   . DULoxetine (CYMBALTA) 20 MG capsule Take 1 capsule (20 mg total) by mouth daily.  . furosemide (LASIX) 40 MG tablet TK 1 T PO QD  . glucose blood test strip Please dispence test strips and lancets of patient's choice.  Check sugars three times daily  . LANTUS SOLOSTAR 100 UNIT/ML Solostar Pen ADMINISTER 34 UNITS UNDER THE SKIN DAILY AT 10 PM (Patient taking differently: 32-34 Units. )  . losartan (COZAAR) 50 MG tablet Take 100 mg by mouth daily.   . Multiple Vitamins-Minerals (PRESERVISION AREDS PO) Take by mouth.  . sertraline (ZOLOFT) 25 MG tablet Take 25 mg by mouth daily.   No facility-administered encounter medications on file as of 10/29/2019.    PHYSICAL EXAM / ROS:   Current and past weights: 205 lbs, down 10 lbs. She is trying to lose weight General: NAD, frail appearing, obese. Has lost 30 lbs since May.  HEENT: Has new glasses, which are now working well.  She's gotten it corrected from wrong lenses initially given. Cardiovascular: no chest pain reported, + edema, h/o lymphedema Pulmonary: no cough, no increased SOB, room air Abdomen: appetite good, states she's eating better and losing weight intentionally endorses constipation, continent of bowel. BG between 100 and 115 FBS. GU: denies dysuria, continent of urine MSK:  no joint deformities, ambulatory in home, walker for going out. Does drive. Avoids narcotics and uses tylenol instead. Skin: no rashes or wounds reported Neurological: Weakness, sleep is good, endorses back pain from arthritis  Heather Coop, NP, [facility]  COVID-19 PATIENT SCREENING TOOL  Person answering questions: ____[name]_______________ _____   1.  Is the patient or any family  member in the home showing any signs or symptoms regarding respiratory infection?               Person with Symptom- _________________________NA__  a. Fever                                                                          Yes___ No___          ___________________  b. Shortness of breath  Yes___ No___          ___________________ c. Cough/congestion                                       Yes___  No___         ___________________ d. Body aches/pains                                                         Yes___ No___        ____________________ e. Gastrointestinal symptoms (diarrhea, nausea)           Yes___ No___        ____________________  2. Within the past 14 days, has anyone living in the home had any contact with someone with or under investigation for COVID-19?    Yes___ No__x   Person __________________  

## 2019-12-31 ENCOUNTER — Other Ambulatory Visit: Payer: Self-pay

## 2019-12-31 ENCOUNTER — Other Ambulatory Visit: Payer: Medicare Other | Admitting: Primary Care

## 2019-12-31 DIAGNOSIS — Z515 Encounter for palliative care: Secondary | ICD-10-CM

## 2019-12-31 NOTE — Progress Notes (Signed)
[place] Consult Note Telephone: [phone]  Fax: [phone]   PATIENT NAME: Heather Long [id number] [phone] (home)  DOB: Jan 11, 1928 MRN: [id number]  PRIMARY CARE PROVIDER:   Sallee Lange, NP, [address] [place] [address] [phone]  REFERRING PROVIDER:  Sallee Lange, NP [address] [place],  [place] [address] [phone]  RESPONSIBLE PARTY:   Extended Emergency Contact Information Primary Emergency Contact: Azarria, Balint Address: [address]          [place], [place] [address] Johnnette Litter of [place] Phone: [phone] Mobile Phone: [phone] Relation: Son   ASSESSMENT AND RECOMMENDATIONS:   1. Advance Care Planning/Goals of Care: Goals include to maximize quality of life and symptom management. She states MOST form is at her daughter in [place]. I asked her to ask them to mail it to her as it should stay with her.Son was at house today but could not wait to meet with me. If it is still not in her home by next time I will see if she will complete the MOST with me.   2. Symptom Management:   Covid-19 vaccine:  We discussed getting this and the shingles immunization too.She does not know how to access the covid vaccine. I gave her the PHD contact to call for appt.  Glucose: 98 fbs this am, she is able to adjust insulin.   Pain: Endorses great pain and had some diarrhea after the cortisone injection. We discussed chronic pain and use of pain medications. States she does not take it now but I recommend ATC tylenol arthritis. She also has hydrocodone/acetaminophen tabs which she rarely uses. We discussed risk/benefit of taking medication to control debilitating chronic pain.  Depression: Stopped cymbalta, made her ill she relates. She is on sertraline 100 mg now. She states she does not have much energy. She says she needs to call an old friend and keeps  putting it off. She is anticipating her 86th birthday next week.  3. Family /Caregiver/Community Supports:  Lives alone, has son and daughter in law in the area who help with errands.   4. Cognitive / Functional decline: A and O x 3. (I) in her own home. Drives to grocery, pharmacy.  5. Follow up Palliative Care Visit: Palliative care will continue to follow for goals of care clarification and symptom management. Return 6 weeks or prn.  I spent 60 minutes providing this consultation,  from 1030 to 1130. More than 50% of the time in this consultation was spent coordinating communication.   HISTORY OF PRESENT ILLNESS:  Heather Long is a 86 y.o. year old female with multiple medical problems including DM,CKD 3, obesity. Palliative Care was asked to follow this patient by consultation request of Gauger, Victoriano Lain, * to help address advance care planning and goals of care. This is a follow up visit.  CODE STATUS: TBD  PPS: 30% HOSPICE ELIGIBILITY/DIAGNOSIS: TBD  PAST MEDICAL HISTORY:  Past Medical History:  Diagnosis Date  . Cancer ([place])   . Chronic kidney disease   . Diabetes mellitus without complication ([place])   . Gout   . Hyperlipidemia   . Hypertension   . Lymphedema   . Obesity   . Osteoporosis   . Paget's disease of bone   . Spinal stenosis     SOCIAL HX:  Social History   Tobacco Use  . Smoking status: Former Smoker    Packs/day: 0.25  Years: 2.00    Pack years: 0.50    Types: Cigarettes    Quit date: 01/02/1952    Years since quitting: 68.0  . Smokeless tobacco: Never Used  . Tobacco comment: smoking cessation materials not required  Substance Use Topics  . Alcohol use: No    ALLERGIES:  Allergies  Allergen Reactions  . Ciprofloxacin Nausea And Vomiting  . Gluten Meal Other (See Comments)    GI distress  . Pioglitazone Other (See Comments)    Leg swelling  . Gabapentin Diarrhea, Nausea Only and Other (See Comments)    dizziness     PERTINENT  MEDICATIONS: No orders of the defined types were placed in this encounter.    PHYSICAL EXAM / ROS:   Current and past weights: unavailable General: NAD, frail appearing, obese Cardiovascular: no chest pain reported, L UE  lymphedema,  2+ LE edema Pulmonary: no cough, no increased SOB,  room air Abdomen: appetite good, denies constipation, incontinent of bowel at times GU: denies dysuria, incontinent of urine at times MSK: OA  deformities, ambulatory with walker, lives alone Skin: no rashes or wounds reported Neurological: Weakness, Pain management by Dr. Rip Harbour, NP  [facility]  COVID-19 PATIENT SCREENING TOOL  Person answering questions: __________[name]_________ _____   1.  Is the patient or any family member in the home showing any signs or symptoms regarding respiratory infection?               Person with Symptom- ___________________________  a. Fever                                                                          Yes___ No___          ___________________  b. Shortness of breath                                                    Yes___ No___          ___________________ c. Cough/congestion                                       Yes___  No___         ___________________ d. Body aches/pains                                                         Yes___ No___        ____________________ e. Gastrointestinal symptoms (diarrhea, nausea)           Yes___ No___        ____________________  2. Within the past 14 days, has anyone living in the home had any contact with someone with or under investigation for COVID-19?    Yes___ No_x_   Person __________________

## 2020-02-12 ENCOUNTER — Other Ambulatory Visit: Payer: Medicare Other | Admitting: Primary Care

## 2020-02-12 ENCOUNTER — Other Ambulatory Visit: Payer: Self-pay

## 2020-02-12 DIAGNOSIS — Z515 Encounter for palliative care: Secondary | ICD-10-CM

## 2020-02-12 NOTE — Progress Notes (Signed)
[facility] Consult Note Telephone: [phone]  Fax: [phone]    PATIENT NAME: Heather Long [place] [id number] [phone] (home)  DOB: Jul 23, 1928 MRN: [id number]  PRIMARY CARE PROVIDER:   Sallee Lange, NP, [address] [place] [address] [phone]  REFERRING PROVIDER:  Sallee Lange, NP [address] [place],  [place] [address] [phone]  RESPONSIBLE PARTY:   Extended Emergency Contact Information Primary Emergency Contact: Neko, Mcgeehan Address: [address]          [place],  [address] [place] Phone: [phone] Mobile Phone: [phone] Relation: Son  I met with Heather. Long in her home.  ASSESSMENT AND RECOMMENDATIONS:   1. Advance Care Planning/Goals of Care: Goals include to maximize quality of life and symptom management. MOST form done with patient with choices for DNR, limited scope, abx and iv use, no feeding tube. She had reviewed with family and made choices. Clarified any questions. She discussed that she would not want to be prolonged if she were unable to enjoy life. Uploaded to [facility].  2. Symptom Management:   Neuropathy: Recommend PCP initiation of gabapentin at HS and perhaps during day. Endorses immobility due to pain. Needs gabapentin for ongoing neuropathies, recommend gradual increase in dose starting at 100 mg at hs.Maryla Morrow her awake at night, burning and itching. Recommend 1000 mg acetaminophen q 8 hrs. [facility] in [place].   LE Edema: Recommend PCP order  lymphedema pumps.Endorses sheets are damp in AM from edema. Assess for more active compression. She would not be able to do self dry wraps and has no live I-n caregiver. Scale not working, batteries replaced and scale functional.   Vaccine: Taking J & J with DIL later this week.  Diabetes: 6.7% A1C, endorses compliance with diet.   3. Family /Caregiver/Community  Supports:  Lives alone, Son and DIL in area.   4. Cognitive / Functional decline: Alert, Oriented x 3. Able to do most adls, needs help with iadls.   5. Follow up Palliative Care Visit: Palliative care will continue to follow for goals of care clarification and symptom management. Return 8 weeks or prn.  I spent 60 minutes providing this consultation,  from 1030 to 1130. More than 50% of the time in this consultation was spent coordinating communication.   HISTORY OF PRESENT ILLNESS:  Heather Long is a 86 y.o. year old female with multiple medical problems including DM, obesity, chronic pain. Palliative Care was asked to follow this patient by consultation request of Gauger, Victoriano Lain, * to help address advance care planning and goals of care. This is a follow up visit.  CODE STATUS: DNR  PPS: 50% HOSPICE ELIGIBILITY/DIAGNOSIS: TBD  PAST MEDICAL HISTORY:  Past Medical History:  Diagnosis Date  . Cancer ([facility])   . Chronic kidney disease   . Diabetes mellitus without complication ([facility])   . Gout   . Hyperlipidemia   . Hypertension   . Lymphedema   . Obesity   . Osteoporosis   . Paget's disease of bone   . Spinal stenosis     SOCIAL HX:  Social History   Tobacco Use  . Smoking status: Former Smoker    Packs/day: 0.25    Years: 2.00    Pack years: 0.50    Types: Cigarettes    Quit date: 01/02/1952    Years since quitting: 68.1  . Smokeless tobacco: Never Used  . Tobacco  comment: smoking cessation materials not required  Substance Use Topics  . Alcohol use: No    ALLERGIES:  Allergies  Allergen Reactions  . Ciprofloxacin Nausea And Vomiting  . Gluten Meal Other (See Comments)    GI distress  . Pioglitazone Other (See Comments)    Leg swelling  . Gabapentin Diarrhea, Nausea Only and Other (See Comments)    dizziness     PERTINENT MEDICATIONS:  Outpatient Encounter Medications as of 02/12/2020  Medication Sig  . ACCU-CHEK AVIVA PLUS test strip USE AS  DIRECTED THREE TIMES DAILY  . acetaminophen (TYLENOL) 325 MG tablet Take 650 mg by mouth every 6 (six) hours as needed.  Marland Kitchen amLODipine (NORVASC) 5 MG tablet Take 5 mg by mouth daily.  . Ascorbic Acid (VITAMIN C) 1000 MG tablet Take 1,000 mg by mouth daily.  . B-D UF III MINI PEN NEEDLES 31G X 5 MM MISC USE AS DIRECTED EVERY DAY  . Cholecalciferol (VITAMIN D3) 5000 units CAPS Take 50,000 Units by mouth every 7 (seven) days. Daily   . furosemide (LASIX) 40 MG tablet TK 1 T PO QD  . glucose blood test strip Please dispence test strips and lancets of patient's choice.  Check sugars three times daily  . LANTUS SOLOSTAR 100 UNIT/ML Solostar Pen ADMINISTER 34 UNITS UNDER THE SKIN DAILY AT 10 PM (Patient taking differently: 32-34 Units. )  . losartan (COZAAR) 50 MG tablet Take 100 mg by mouth daily.   . Multiple Vitamins-Minerals (PRESERVISION AREDS PO) Take by mouth.  . sertraline (ZOLOFT) 100 MG tablet Take 100 mg by mouth daily.    No facility-administered encounter medications on file as of 02/12/2020.    PHYSICAL EXAM / ROS:   Current and past weights: endorses continued weight loss General: NAD, frail appearing, obese, endorses chronic pain Cardiovascular: no chest pain reported, LE lymphedema,  Pulmonary: no cough, no increased SOB, room air Abdomen: appetite good,  denies constipation, continent of bowel GU: denies dysuria, continent of urine MSK:  artthritic joint deformities,  ambulatory with walker Skin: no rashes or wounds reported, intact skin on back, reports itching. Neurological: Weakness, endorses moderate to severe neuropathic pain in LE, has impacted sleep.  Jason Coop, NP [facility]  COVID-19 PATIENT SCREENING TOOL  Person answering questions: ___________Mrs Biederman______ _____   1.  Is the patient or any family member in the home showing any signs or symptoms regarding respiratory infection?               Person with Symptom-  __________NA_________________  a. Fever                                                                          Yes___ No___          ___________________  b. Shortness of breath                                                    Yes___ No___          ___________________ c. Cough/congestion  Yes___  No___         ___________________ d. Body aches/pains                                                         Yes___ No___        ____________________ e. Gastrointestinal symptoms (diarrhea, nausea)           Yes___ No___        ____________________  2. Within the past 14 days, has anyone living in the home had any contact with someone with or under investigation for COVID-19?    Yes___ No_X_   Person __________________

## 2020-02-24 ENCOUNTER — Telehealth: Payer: Self-pay | Admitting: *Deleted

## 2020-02-24 NOTE — Telephone Encounter (Signed)
Lea for pre visit info. No answer. LVM.

## 2020-02-25 ENCOUNTER — Telehealth: Payer: Self-pay | Admitting: *Deleted

## 2020-02-25 ENCOUNTER — Other Ambulatory Visit: Payer: Self-pay

## 2020-02-25 ENCOUNTER — Ambulatory Visit
Payer: Medicare Other | Attending: Student in an Organized Health Care Education/Training Program | Admitting: Student in an Organized Health Care Education/Training Program

## 2020-02-25 ENCOUNTER — Encounter: Payer: Self-pay | Admitting: Student in an Organized Health Care Education/Training Program

## 2020-02-25 DIAGNOSIS — M457 Ankylosing spondylitis of lumbosacral region: Secondary | ICD-10-CM

## 2020-02-25 DIAGNOSIS — M797 Fibromyalgia: Secondary | ICD-10-CM | POA: Diagnosis not present

## 2020-02-25 DIAGNOSIS — M533 Sacrococcygeal disorders, not elsewhere classified: Secondary | ICD-10-CM | POA: Diagnosis not present

## 2020-02-25 DIAGNOSIS — G8929 Other chronic pain: Secondary | ICD-10-CM

## 2020-02-25 DIAGNOSIS — M5136 Other intervertebral disc degeneration, lumbar region: Secondary | ICD-10-CM

## 2020-02-25 DIAGNOSIS — M47818 Spondylosis without myelopathy or radiculopathy, sacral and sacrococcygeal region: Secondary | ICD-10-CM

## 2020-02-25 DIAGNOSIS — M47816 Spondylosis without myelopathy or radiculopathy, lumbar region: Secondary | ICD-10-CM | POA: Diagnosis not present

## 2020-02-25 DIAGNOSIS — G894 Chronic pain syndrome: Secondary | ICD-10-CM

## 2020-02-25 MED ORDER — HYDROCODONE-ACETAMINOPHEN 5-325 MG PO TABS: 1.0000 | ORAL_TABLET | Freq: Two times a day (BID) | ORAL | 0 refills | Status: AC | PRN

## 2020-02-25 NOTE — Telephone Encounter (Signed)
No additional notes

## 2020-02-25 NOTE — Progress Notes (Signed)
Patient: Heather Long  Service Category: E/M  Provider: Bilal Lateef, MD  DOB: 06/01/1928  DOS: 02/25/2020  Location: Office  MRN: [id number]  Setting: Ambulatory outpatient  Referring Provider: Gauger, Sarah Kathryn, *  Type: Established Patient  Specialty: Interventional Pain Management  PCP: Gauger, Sarah Kathryn, NP  Location: Home  Delivery: TeleHealth     Virtual Encounter - Pain Management PROVIDER NOTE: Information contained herein reflects review and annotations entered in association with encounter. Interpretation of such information and data should be left to medically-trained personnel. Information provided to patient can be located elsewhere in the medical record under "Patient Instructions". Document created using STT-dictation technology, any transcriptional errors that may result from process are unintentional.    Contact & Pharmacy Preferred: [phone] Home: [phone] (home) Mobile: There is no such number on file (mobile). E-mail: [email or web address]  [id number] - [place], [place] - [address] [place] [address] [address] Phone: [phone] Fax: [phone]   Pre-screening  Heather Long offered "in-person" vs "virtual" encounter. She indicated preferring virtual for this encounter.   Reason COVID-19*  Social distancing based on CDC and AMA recommendations.   I contacted Heather Long on 02/25/2020 via telephone.      I clearly identified myself as Bilal Lateef, MD. I verified that I was speaking with the correct person using two identifiers (Name: Heather Long, and date of birth: 12/12/1927).  This visit was completed via telephone due to the restrictions of the COVID-19 pandemic. All issues as above were discussed and addressed but no physical exam was performed. If it was felt that the patient should be evaluated in the office, they were directed there. The patient verbally consented to this  visit. Patient was unable to complete an audio/visual visit due to Technical difficulties and/or Lack of internet. Due to the catastrophic nature of the COVID-19 pandemic, this visit was done through audio contact only.  Location of the patient: home address (see Epic for details)  Location of the provider: office  Consent I sought verbal advanced consent from Heather Long for virtual visit interactions. I informed Heather Long of possible security and privacy concerns, risks, and limitations associated with providing "not-in-person" medical evaluation and management services. I also informed Heather Long of the availability of "in-person" appointments. Finally, I informed her that there would be a charge for the virtual visit and that she could be  personally, fully or partially, financially responsible for it. Heather Long expressed understanding and agreed to proceed.   Historic Elements   Heather Long is a 86 y.o. year old, female patient evaluated today after her last contact with our practice on 02/25/2020. Heather Long  has a past medical history of Cancer (H[facility]), Chronic kidney disease, Diabetes mellitus without complication (HCC), Gout, Hyperlipidemia, Hypertension, Lymphedema, Obesity, Osteoporosis, Paget's disease of bone, and Spinal stenosis. She also  has a past surgical history that includes Joint replacement (2000, 2006); Abdominal hysterectomy; ORIF wrist fracture (07/2016); and Mastectomy (Left, 2010). Heather Long has a current medication list which includes the following prescription(s): accu-chek aviva plus, acetaminophen, alendronate, amlodipine, vitamin c, b-d uf iii mini pen needles, vitamin d3, furosemide, glucose blood, hydrocodone-acetaminophen, [START ON 03/26/2020] hydrocodone-acetaminophen, lantus solostar, losartan, multiple vitamins-minerals, and sertraline. She  reports that she quit smoking about 68 years ago. Her smoking use included cigarettes. She has a  0.50 pack-year smoking history. She has never used smokeless tobacco. She reports that she does not   drink alcohol or use drugs. Heather Long is allergic to ciprofloxacin; gluten meal; pioglitazone; and gabapentin.   HPI  Today, she is being contacted for medication management.   Lost 30 lbs since our last visit- eating vegetables, chicken States that this has considerably helped her pain Has significant lymphedema L>R, makes it very painful for her to walk. Seen at wound clinic. Has compression stockings in place. BP well controlled Takes Hydrocodone 5 mg BID prn, states that it helps manage her pain  Pharmacotherapy Assessment  Analgesic:  08/27/2019  1   08/27/2019  Hydrocodone-Acetamin 5-325 MG  60.00  30 Bi Lat   [id number]   Wal (4612)   0  10.00 MME  Comm Ins   [place]   Monitoring:  PMP: PDMP reviewed during this encounter.       Pharmacotherapy: No side-effects or adverse reactions reported. Compliance: No problems identified. Effectiveness: Clinically acceptable. Plan: Refer to "POC".  UDS: No results found for: SUMMARY Laboratory Chemistry Profile   Renal Lab Results  Component Value Date   BUN 26 08/18/2018   CREATININE 1.26 (H) 08/18/2018   BCR 21 08/18/2018   GFRAA 44 (L) 08/18/2018   GFRNONAA 38 (L) 08/18/2018     Hepatic Lab Results  Component Value Date   AST 16 04/15/2018   ALT 12 04/15/2018   ALBUMIN 4.0 04/15/2018   ALKPHOS 61 04/15/2018   LIPASE 17 12/17/2016     Electrolytes Lab Results  Component Value Date   NA 142 08/18/2018   K 5.0 08/18/2018   CL 107 (H) 08/18/2018   CALCIUM 9.5 08/18/2018     Bone Lab Results  Component Value Date   VD25OH 18.8 (L) 11/14/2016     Inflammation (CRP: Acute Phase) (ESR: Chronic Phase) Lab Results  Component Value Date   ESRSEDRATE 36 05/02/2016       Note: Above Lab results reviewed.  Imaging  DG PAIN CLINIC C-ARM 1-60 MIN NO REPORT Fluoro was used, but no Radiologist interpretation will be  provided.  Please refer to "NOTES" tab for provider progress note.  Assessment  The primary encounter diagnosis was Lumbar spondylosis. Diagnoses of Lumbar facet joint syndrome, Lumbar facet arthropathy, Fibromyalgia, Chronic SI joint pain, Ankylosing spondylitis of lumbosacral region ([place]), SI joint arthritis, Lumbar degenerative disc disease, and Chronic pain syndrome were also pertinent to this visit.  Plan of Care   Heather Long has a current medication list which includes the following long-term medication(s): furosemide, lantus solostar, losartan, and sertraline.  The patien has improved diet patterns to aid in medical management of this problem.  Pharmacotherapy (Medications Ordered): Meds ordered this encounter  Medications  . HYDROcodone-acetaminophen (NORCO/VICODIN) 5-325 MG tablet    Sig: Take 1 tablet by mouth 2 (two) times daily as needed for severe pain. Must last 30 days.    Dispense:  60 tablet    Refill:  0    Chronic Pain. (STOP Act - Not applicable). Fill one day early if closed on scheduled refill date.  Marland Kitchen HYDROcodone-acetaminophen (NORCO/VICODIN) 5-325 MG tablet    Sig: Take 1 tablet by mouth 2 (two) times daily as needed for severe pain. Must last 30 days.    Dispense:  60 tablet    Refill:  0    Chronic Pain. (STOP Act - Not applicable). Fill one day early if closed on scheduled refill date.   Follow-up plan:   Return if symptoms worsen or fail to improve.     s/p diagnostic lumbar  facets b/l L3,4,5 S1 on 07/29/19- helped repeat PRN     Recent Visits No visits were found meeting these conditions.  Showing recent visits within past 90 days and meeting all other requirements   Today's Visits Date Type Provider Dept  02/25/20 Office Visit Lateef, Bilal, MD [facility]  Showing today's visits and meeting all other requirements   Future Appointments No visits were found meeting these conditions.  Showing future appointments within next 90  days and meeting all other requirements   I discussed the assessment and treatment plan with the patient. The patient was provided an opportunity to ask questions and all were answered. The patient agreed with the plan and demonstrated an understanding of the instructions.  Patient advised to call back or seek an in-person evaluation if the symptoms or condition worsens.  Duration of encounter: 25 minutes.  Note by: Bilal Lateef, MD Date: 02/25/2020; Time: 9:55 AM 

## 2020-03-04 ENCOUNTER — Emergency Department
Admission: EM | Admit: 2020-03-04 | Discharge: 2020-03-04 | Disposition: A | Discharge status: Home / Self Care | Payer: Medicare Other | Attending: Emergency Medicine | Admitting: Emergency Medicine

## 2020-03-04 ENCOUNTER — Emergency Department: Payer: Medicare Other

## 2020-03-04 ENCOUNTER — Other Ambulatory Visit: Payer: Self-pay

## 2020-03-04 ENCOUNTER — Encounter: Payer: Self-pay | Admitting: Emergency Medicine

## 2020-03-04 DIAGNOSIS — M859 Disorder of bone density and structure, unspecified: Secondary | ICD-10-CM | POA: Diagnosis not present

## 2020-03-04 DIAGNOSIS — Y929 Unspecified place or not applicable: Secondary | ICD-10-CM | POA: Diagnosis not present

## 2020-03-04 DIAGNOSIS — W010XXA Fall on same level from slipping, tripping and stumbling without subsequent striking against object, initial encounter: Secondary | ICD-10-CM | POA: Insufficient documentation

## 2020-03-04 DIAGNOSIS — Y9389 Activity, other specified: Secondary | ICD-10-CM | POA: Insufficient documentation

## 2020-03-04 DIAGNOSIS — M7918 Myalgia, other site: Secondary | ICD-10-CM

## 2020-03-04 DIAGNOSIS — M25512 Pain in left shoulder: Secondary | ICD-10-CM | POA: Diagnosis present

## 2020-03-04 DIAGNOSIS — Y999 Unspecified external cause status: Secondary | ICD-10-CM | POA: Diagnosis not present

## 2020-03-04 DIAGNOSIS — M25561 Pain in right knee: Secondary | ICD-10-CM | POA: Diagnosis not present

## 2020-03-04 DIAGNOSIS — Z96653 Presence of artificial knee joint, bilateral: Secondary | ICD-10-CM | POA: Diagnosis not present

## 2020-03-04 MED ORDER — LIDOCAINE 5 % EX PTCH: 1.0000 | MEDICATED_PATCH | Freq: Two times a day (BID) | CUTANEOUS | 0 refills | Status: DC

## 2020-03-04 MED ORDER — TRAMADOL HCL 50 MG PO TABS
50.0000 mg | ORAL_TABLET | Freq: Once | ORAL | Status: AC
  Administered 2020-03-04: 50 mg via ORAL
  Filled 2020-03-04: qty 1

## 2020-03-04 MED ORDER — LIDOCAINE 5 % EX PTCH
2.0000 | MEDICATED_PATCH | CUTANEOUS | Status: DC
  Administered 2020-03-04: 2 via TRANSDERMAL
  Filled 2020-03-04: qty 2

## 2020-03-04 NOTE — Discharge Instructions (Signed)
Follow discharge care instructions.  Continue previous medication.  Apply Lidoderm patches to affected area as directed for 12 hours/day.

## 2020-03-04 NOTE — ED Notes (Signed)
See triage note. Pt c/o R knee and L shoulder pain. Pt states EMS pulled on her shoulder to help her up and caused it to be sore. Hx bil knee replacements. R knee appears slightly more swollen than L. Pt able to transfer from [facility] to commode with rolling walker with standby assist.

## 2020-03-04 NOTE — ED Triage Notes (Signed)
Pt to ED via [facility] for fall. Pt is having pain in her right knee and left shoulder. Pt states that she is unable to put weight on her right knee. Pt is in NAD.

## 2020-03-04 NOTE — ED Provider Notes (Signed)
[facility] Emergency Department Provider Note   ____________________________________________   First MD Initiated Contact with Patient 03/04/20 1441     (approximate)  I have reviewed the triage vital signs and the nursing notes.   HISTORY  Chief Complaint Fall    HPI Heather Long is a 86 y.o. female patient arrived via EMS complaining of left shoulder and right knee pain secondary to fall.  Patient state unable to put weight on the right knee.  Patient is a mechanical fall without LOC or head injury.  Patient rates the pain as a 4/10.  Patient described the pain is "achy".  No palliative measures prior to arrival.  Patient has a history of chronic back pain and lymphedema.  Patient states she normally takes 1 hydrocodone per day as needed for pain.  Patient not taking pain medication today.      Past Medical History:  Diagnosis Date  . Cancer ([place])   . Chronic kidney disease   . Diabetes mellitus without complication ([place])   . Gout   . Hyperlipidemia   . Hypertension   . Lymphedema   . Obesity   . Osteoporosis   . Paget's disease of bone   . Spinal stenosis     Patient Active Problem List   Diagnosis Date Noted  . Lumbar spondylosis 05/14/2019  . Lumbar facet arthropathy 05/14/2019  . SI joint arthritis 05/14/2019  . Chronic pain syndrome 04/16/2019  . Fibromyalgia 04/16/2019  . Lumbar degenerative disc disease 04/16/2019  . Lumbar facet joint syndrome 04/16/2019  . Chronic SI joint pain 04/16/2019  . Ankylosing spondylitis of lumbosacral region ([place]) 04/16/2019  . CKD (chronic kidney disease) stage 3, GFR 30-59 ml/min 04/16/2019  . Lymphedema 09/17/2018  . Long term current use of insulin ([place]) 09/12/2018  . Rotator cuff disorder, right 04/15/2018  . Secondary hyperparathyroidism of renal origin ([place]) 10/16/2017  . Venous stasis dermatitis of left lower extremity 07/16/2017  . Chronic congestion of paranasal sinus 03/13/2017   . Degeneration macular 05/23/2016  . Osteitis deformans 05/23/2016  . Neoplasm of uncertain behavior of skin of face 05/23/2016  . Psoriasis 05/23/2016  . Vitamin B12 deficiency 05/16/2016  . Obesity, Class III, BMI 40-49.9 (morbid obesity) ([place]) 05/02/2016  . Type 2 DM with CKD stage 4 and hypertension ([place]) 05/02/2016  . Vitamin D deficiency 05/02/2016  . Lower extremity edema 05/02/2016  . Spinal stenosis of lumbar region 05/02/2016  . Essential (primary) hypertension 06/02/2012  . Hyperlipidemia associated with type 2 diabetes mellitus ([place]) 06/02/2012  . History of breast cancer 06/14/2011  . Generalized OA 06/14/2011  . Lymphedema of arm 06/14/2011  . OP (osteoporosis) 06/14/2011    Past Surgical History:  Procedure Laterality Date  . ABDOMINAL HYSTERECTOMY    . JOINT REPLACEMENT  2000, 2006  . MASTECTOMY Left 2010  . ORIF WRIST FRACTURE  07/2016    Prior to Admission medications   Medication Sig Start Date End Date Taking? Authorizing Provider  ACCU-CHEK AVIVA PLUS test strip USE AS DIRECTED THREE TIMES DAILY 08/11/18   Glean Hess, MD  acetaminophen (TYLENOL) 325 MG tablet Take 650 mg by mouth every 6 (six) hours as needed.    [provider]  alendronate (FOSAMAX) 70 MG tablet Take by mouth. 12/04/12   [provider]  amLODipine (NORVASC) 5 MG tablet Take 5 mg by mouth daily.    [provider]  Ascorbic Acid (VITAMIN C) 1000 MG tablet Take 1,000 mg by mouth  daily.    [provider]  B-D UF III MINI PEN NEEDLES 31G X 5 MM MISC USE AS DIRECTED EVERY DAY 12/24/17   Glean Hess, MD  Cholecalciferol (VITAMIN D3) 5000 units CAPS Take 50,000 Units by mouth daily. Daily     [provider]  furosemide (LASIX) 40 MG tablet TK 1 T PO QD 08/28/18   [provider]  glucose blood test strip Please dispence test strips and lancets of patient's choice.  Check sugars three times daily 10/23/10   [provider]   HYDROcodone-acetaminophen (NORCO/VICODIN) 5-325 MG tablet Take 1 tablet by mouth 2 (two) times daily as needed for severe pain. Must last 30 days. 02/25/20 03/26/20  Gillis Santa, MD  HYDROcodone-acetaminophen (NORCO/VICODIN) 5-325 MG tablet Take 1 tablet by mouth 2 (two) times daily as needed for severe pain. Must last 30 days. 03/26/20 04/25/20  Gillis Santa, MD  LANTUS SOLOSTAR 100 UNIT/ML Solostar Pen ADMINISTER 34 UNITS UNDER THE SKIN DAILY AT 10 PM Patient taking differently: 32-34 Units.  12/08/17   Glean Hess, MD  lidocaine (LIDODERM) 5 % Place 1 patch onto the skin every 12 (twelve) hours. Remove & Discard patch within 12 hours or as directed by MD 03/04/20 03/04/21  Sable Feil, PA-C  losartan (COZAAR) 50 MG tablet Take 100 mg by mouth daily.     [provider]  Multiple Vitamins-Minerals (PRESERVISION AREDS PO) Take by mouth.    [provider]  sertraline (ZOLOFT) 100 MG tablet Take 100 mg by mouth daily.     [provider]    Allergies Ciprofloxacin, Gluten meal, Pioglitazone, and Gabapentin  Family History  Problem Relation Age of Onset  . Diabetes Mother   . Cancer Mother   . Heart disease Father   . Throat cancer Sister   . Heart disease Brother   . Diabetes Brother   . Cancer Sister   . Heart disease Brother   . Heart disease Brother     Social History Social History   Tobacco Use  . Smoking status: Former Smoker    Packs/day: 0.25    Years: 2.00    Pack years: 0.50    Types: Cigarettes    Quit date: 01/02/1952    Years since quitting: 68.2  . Smokeless tobacco: Never Used  . Tobacco comment: smoking cessation materials not required  Substance Use Topics  . Alcohol use: No  . Drug use: No    Review of Systems Constitutional: No fever/chills Eyes: No visual changes. ENT: No sore throat. Cardiovascular: Denies chest pain. Respiratory: Denies shortness of breath. Gastrointestinal: No abdominal pain.  No nausea, no  vomiting.  No diarrhea.  No constipation. Genitourinary: Negative for dysuria. Musculoskeletal: Left shoulder and right knee pain.   Skin: Negative for rash. Neurological: Negative for headaches, focal weakness or numbness. {**Psychiatric:  Endocrine:  Diabetes, hyperlipidemia, hypertension. Hematological/Lymphatic:  Allergic/Immunilogical: Cipro, gluten meal, and gabapentin.  ____________________________________________   PHYSICAL EXAM:  VITAL SIGNS: ED Triage Vitals [03/04/20 1334]  Enc Vitals Group     BP (!) 162/52     Pulse Rate 72     Resp 16     Temp 98.9 F (37.2 C)     Temp src      SpO2 97 %     Weight      Height      Head Circumference      Peak Flow      Pain Score 4  Pain Loc      Pain Edu?      Excl. in [place]?     Constitutional: Alert and oriented. Well appearing and in no acute distress. Eyes: Conjunctivae are normal. PERRL. EOMI. Head: Atraumatic. Nose: No congestion/rhinnorhea. Mouth/Throat: Mucous membranes are moist.  Oropharynx non-erythematous. Neck: No cervical spine tenderness to palpation. Hematological/Lymphatic/Immunilogical: No cervical lymphadenopathy. Cardiovascular: Normal rate, regular rhythm. Grossly normal heart sounds.  Good peripheral circulation. Respiratory: Normal respiratory effort.  No retractions. Lungs CTAB. Gastrointestinal: Soft and nontender. No distention. No abdominal bruits. No CVA tenderness. Genitourinary: Deferred Musculoskeletal: No obvious deformity of the left shoulder or right knee.  Exam is limited by body habitus.   Neurologic:  Normal speech and language. No gross focal neurologic deficits are appreciated. No gait instability. Skin:  Skin is warm, dry and intact. No rash noted. Psychiatric: Mood and affect are normal. Speech and behavior are normal.  ____________________________________________   LABS (all labs ordered are listed, but only abnormal results are displayed)  Labs Reviewed - No data to  display ____________________________________________  EKG   ____________________________________________  RADIOLOGY  ED MD interpretation:    Official radiology report(s): DG Shoulder Left  Result Date: 03/04/2020 CLINICAL DATA:  Left-sided shoulder pain following fall EXAM: LEFT SHOULDER - 2+ VIEW COMPARISON:  None. FINDINGS: Degenerative changes of the glenohumeral joint are noted. No acute fracture or dislocation is seen. Underlying bony thorax is within normal limits. No soft tissue changes are seen. IMPRESSION: Degenerative changes of the shoulder without acute abnormality Electronically Signed   By: Inez Catalina M.D.   On: 03/04/2020 15:35   DG Knee Complete 4 Views Right  Result Date: 03/04/2020 CLINICAL DATA:  Recent fall with right knee pain, initial encounter EXAM: RIGHT KNEE - COMPLETE 4+ VIEW COMPARISON:  08/18/2010 FINDINGS: Changes of right knee replacement are again seen and stable. No changes to suggest acute fracture or dislocation are noted. No loosening is seen. No joint effusion is noted. IMPRESSION: Postsurgical change without acute abnormality. Electronically Signed   By: Inez Catalina M.D.   On: 03/04/2020 15:36    ____________________________________________   PROCEDURES  Procedure(s) performed (including Critical Care):  Procedures   ____________________________________________   INITIAL IMPRESSION / ASSESSMENT AND PLAN / ED COURSE  As part of my medical decision making, I reviewed the following data within the [facility]     Patient presents with left shoulder and right knee pain secondary to a fall.  Patient denies head injury or LOC.  Discussed x-ray findings with patient revealing no acute findings.  Patient complaint physical exam consistent muscle skeletal pain secondary to fall.  Patient given discharge care instructions and advised continue chronic pain medications previous prescribed.  Patient may apply Lidoderm patches to  affected area for 12 hours/day.  Follow-up PCP.    Heather Long was evaluated in Emergency Department on 03/04/2020 for the symptoms described in the history of present illness. She was evaluated in the context of the global COVID-19 pandemic, which necessitated consideration that the patient might be at risk for infection with the SARS-CoV-2 virus that causes COVID-19. Institutional protocols and algorithms that pertain to the evaluation of patients at risk for COVID-19 are in a state of rapid change based on information released by regulatory bodies including the CDC and federal and state organizations. These policies and algorithms were followed during the patient's care in the ED.       ____________________________________________   FINAL CLINICAL IMPRESSION(S) / ED DIAGNOSES  Final diagnoses:  Musculoskeletal pain     ED Discharge Orders         Ordered    lidocaine (LIDODERM) 5 %  Every 12 hours     03/04/20 1553           Note:  This document was prepared using Dragon voice recognition software and may include unintentional dictation errors.    Sable Feil, PA-C 03/04/20 1556    Lavonia Drafts, MD 03/05/20 [phone]

## 2020-03-08 ENCOUNTER — Telehealth: Payer: Self-pay | Admitting: Nurse Practitioner

## 2020-03-08 NOTE — Telephone Encounter (Signed)
Copied from [place] [phone]. Topic: General - Other >> Mar 08, 2020  1:31 PM Alanda Slim E wrote: Reason for CRM: Kennyth Lose from [facility] called and states that the Pt was seen in ER on 4.16.21/ Pt was Prescribed Lidocaine patches that need a PA. [facility] will fax office/ Dr Tamala Julian in ER wrote Rx but it may be hard to get him to complete PA/ please advise   Patient is not seen at [facility], caller will need to contact new PCP at [facility]

## 2020-03-08 NOTE — Telephone Encounter (Signed)
Left message with Kennyth Lose from [facility] that she is no longer a Pt at [facility]

## 2020-04-11 ENCOUNTER — Other Ambulatory Visit: Payer: Medicare Other | Admitting: Primary Care

## 2020-04-11 ENCOUNTER — Other Ambulatory Visit: Payer: Self-pay

## 2020-04-11 DIAGNOSIS — M457 Ankylosing spondylitis of lumbosacral region: Secondary | ICD-10-CM

## 2020-04-11 DIAGNOSIS — Z515 Encounter for palliative care: Secondary | ICD-10-CM

## 2020-04-11 NOTE — Progress Notes (Signed)
[facility] Consult Note Telephone: [phone]  Fax: [phone]  PATIENT NAME: Heather Long [place] [address] [phone] (home)  DOB: [birth year] MRN: [id number]  PRIMARY CARE PROVIDER:    Sallee Lange, NP,  [address] [place] [address] [phone]  REFERRING PROVIDER:   Sallee Lange, NP [address] [place],  [place] [address] [phone]  RESPONSIBLE PARTY:   Extended Emergency Contact Information Primary Emergency Contact: Norabelle, Kondo Address: [address]          [place], [place] [address] [place] Phone: [phone] Mobile Phone: [phone] Relation: Son  I met with patient in the home.  ASSESSMENT AND RECOMMENDATIONS:   1. Advance Care Planning/Goals of Care: Goals include to maximize quality of life and symptom management. MOST form on file with choices for DNR, limited scope, abx and iv use, no feeding tube.   2. Symptom Management:   [place] in April and has sustained muscle pain in back, shoulder and neck. She was picked up by a single policeman who twisted her shoulder. She has very significant pain now.   Recommend urgent referral to orthopedics. Recommend trial of gabapentin or lyrica for nerve pain. May also benefit from massage or acupuncture.   Discussed very moderate use of ibuprofen due to great pain. Also appears to have some skin irritation from the adhesive of lidoderm patches.   3. Family /Caregiver/Community Supports:  Lives alone in own home. Needs more home assistance. Has MOW.   4. Cognitive / Functional decline: A and O,  falls, needs assistance. She will call [facility] to ask about additional services.   5. Follow up Palliative Care Visit: Palliative care will continue to follow for goals of care clarification and symptom management. Return 4-8  weeks or prn.  I spent 60 minutes providing this consultation,  from 1100 to  1200. More than 50% of the time in this consultation was spent coordinating communication.   HISTORY OF PRESENT ILLNESS:  Heather Long is a 86 y.o. year old female with multiple medical problems including arthritis, chronic pain, neuropathic pain, DM, obesity . Palliative Care was asked to follow this patient by consultation request of [place], Victoriano Lain, * to help address advance care planning and goals of care. This is a follow up visit.  CODE STATUS: MOST form done with patient with choices for DNR, limited scope, abx and iv use, no feeding tube.   PPS: 50% HOSPICE ELIGIBILITY/DIAGNOSIS: no  PAST MEDICAL HISTORY:  Past Medical History:  Diagnosis Date  . Cancer ([place])   . Chronic kidney disease   . Diabetes mellitus without complication ([place])   . Gout   . Hyperlipidemia   . Hypertension   . Lymphedema   . Obesity   . Osteoporosis   . Paget's disease of bone   . Spinal stenosis     SOCIAL HX:  Social History   Tobacco Use  . Smoking status: Former Smoker    Packs/day: 0.25    Years: 2.00    Pack years: 0.50    Types: Cigarettes    Quit date: 01/02/1952    Years since quitting: 68.3  . Smokeless tobacco: Never Used  . Tobacco comment: smoking cessation materials not required  Substance Use Topics  . Alcohol use: No    ALLERGIES:  Allergies  Allergen Reactions  . Ciprofloxacin Nausea And Vomiting  . Gluten Meal Other (See Comments)  GI distress  . Pioglitazone Other (See Comments)    Leg swelling  . Gabapentin Diarrhea, Nausea Only and Other (See Comments)    dizziness     PERTINENT MEDICATIONS:  Outpatient Encounter Medications as of 04/11/2020  Medication Sig  . ACCU-CHEK AVIVA PLUS test strip USE AS DIRECTED THREE TIMES DAILY  . acetaminophen (TYLENOL) 325 MG tablet Take 650 mg by mouth every 6 (six) hours as needed.  Marland Kitchen alendronate (FOSAMAX) 70 MG tablet Take by mouth.  Marland Kitchen amLODipine (NORVASC) 5 MG tablet Take 5 mg by mouth daily.  . Ascorbic Acid  (VITAMIN C) 1000 MG tablet Take 1,000 mg by mouth daily.  . B-D UF III MINI PEN NEEDLES 31G X 5 MM MISC USE AS DIRECTED EVERY DAY  . Cholecalciferol (VITAMIN D3) 5000 units CAPS Take 50,000 Units by mouth daily. Daily   . furosemide (LASIX) 40 MG tablet TK 1 T PO QD  . glucose blood test strip Please dispence test strips and lancets of patient's choice.  Check sugars three times daily  . HYDROcodone-acetaminophen (NORCO/VICODIN) 5-325 MG tablet Take 1 tablet by mouth 2 (two) times daily as needed for severe pain. Must last 30 days.  Marland Kitchen LANTUS SOLOSTAR 100 UNIT/ML Solostar Pen ADMINISTER 34 UNITS UNDER THE SKIN DAILY AT 10 PM (Patient taking differently: 32-34 Units. )  . lidocaine (LIDODERM) 5 % Place 1 patch onto the skin every 12 (twelve) hours. Remove & Discard patch within 12 hours or as directed by MD  . losartan (COZAAR) 50 MG tablet Take 100 mg by mouth daily.   . Multiple Vitamins-Minerals (PRESERVISION AREDS PO) Take by mouth.  . sertraline (ZOLOFT) 100 MG tablet Take 100 mg by mouth daily.    No facility-administered encounter medications on file as of 04/11/2020.    PHYSICAL EXAM / ROS:   Current and past weights: unavailable General: NAD, frail appearing, obese Cardiovascular: no chest pain reported, 2+  edema  Pulmonary: no cough, no increased SOB, room air Abdomen: appetite good, denies constipation, continent of bowel GU: denies dysuria, continent of urine MSK:  Significant joint and ROM abnormalities, ambulatory with walker. Underlying bone disease. Skin: no rashes or wounds reported Neurological: Weakness, Pain in left shoulder from recent injury, 7-8/10.  Jason Coop, NP [facility]  COVID-19 PATIENT SCREENING TOOL  Person answering questions: ____________Self_____ _____   1.  Is the patient or any family member in the home showing any signs or symptoms regarding respiratory infection?               Person with Symptom- __________NA_________________  a. Fever                                                                           Yes___ No___          ___________________  b. Shortness of breath                                                    Yes___ No___          ___________________ c.  Cough/congestion                                       Yes___  No___         ___________________ d. Body aches/pains                                                         Yes___ No___        ____________________ e. Gastrointestinal symptoms (diarrhea, nausea)           Yes___ No___        ____________________  2. Within the past 14 days, has anyone living in the home had any contact with someone with or under investigation for COVID-19?    Yes___ No_X_   Person __________________

## 2020-05-26 ENCOUNTER — Other Ambulatory Visit: Payer: Self-pay

## 2020-05-26 ENCOUNTER — Other Ambulatory Visit: Payer: Medicare Other | Admitting: Primary Care

## 2020-05-26 DIAGNOSIS — M457 Ankylosing spondylitis of lumbosacral region: Secondary | ICD-10-CM

## 2020-05-26 DIAGNOSIS — Z515 Encounter for palliative care: Secondary | ICD-10-CM

## 2020-05-26 NOTE — Progress Notes (Signed)
[facility] Consult Note Telephone: [phone]  Fax: [phone]  PATIENT NAME: Heather Long [id number] [phone] (home)  DOB: 04/24/1928 MRN: [id number]  PRIMARY CARE PROVIDER:    Sallee Lange, NP,  [address] [place] [address] [phone]  REFERRING PROVIDER:   Sallee Lange, NP [address] [place],  [place] [address] [phone]  RESPONSIBLE PARTY:   Extended Emergency Contact Information Primary Emergency Contact: Heather, Long Address: [address]          [place], [place] [address] [place] Phone: [phone] Mobile Phone: [phone] Relation: Son  I met with patient  In her  home  ASSESSMENT AND RECOMMENDATIONS:   1. Advance Care Planning/Goals of Care: Goals include to maximize quality of life and symptom management. Our advance care planning conversation included a discussion about living with mobility limiting disease, need for assistance with adls and desire to have medical issues treated. MOST on file with DNR, limited scope, use of abx, iv, no feeding tube.  2. Symptom Management:   Neuropathies: Endorses ongoing LE pain; trial of  gabapentin 100 mgpo  q hs sent to [facility].  She is interested in trying this for hs leg pain that causes her to awaken in pain and not be able to sleep.  OA Pain: Doing PT thru [facility].  This is helping shoulder pain from injury from EMS getting her up from a fall. She is due to go to pain management soon to have a check in. I encouraged her to follow with pain clinic to address narcotic medications she obtains. I have as above sent in gabapentin, and also recommend OTC Acetaminophen CR 650 mg - 1300 mg q 8 hrs. Treating chronic pain with ATC dosing discussed.               Safety: Discussed recent scam she almost fell prey to. She called her bank and found out it was a scam. We discussed  calls coming in unsolicited by people with stories that are in urgent need of money. Reinforced to always call family or trusted friends before giving out any money.  3. Family /Caregiver/Community Supports: Son is POA. DIL helps with errands. States she misses newcomer club she used to belong to. May enjoy a senior center with activities.  4. Cognitive / Functional decline: A and O, x 3. Ambulating without walker steadily. Much better function since PT has begun.  5. Follow up Palliative Care Visit: Palliative care will continue to follow for goals of care clarification and symptom management. Return 6 weeks or prn.  I spent 60 minutes providing this consultation,  from 1030 to 1130. More than 50% of the time in this consultation was spent coordinating communication.   HISTORY OF PRESENT ILLNESS:  Heather Long is a 86 y.o. year old female with multiple medical problems including chronic pain, mobility loss, fall risk. Palliative Care was asked to follow this patient by consultation request of Gauger, Victoriano Lain, * to help address advance care planning and goals of care. This is a follow up visit.  CODE STATUS:  DNR  PPS: 50%  HOSPICE ELIGIBILITY/DIAGNOSIS:no  PAST MEDICAL HISTORY:  Past Medical History:  Diagnosis Date   Cancer ([facility])    Chronic kidney disease    Diabetes mellitus without complication ([facility])    Gout    Hyperlipidemia    Hypertension    Lymphedema  Obesity    Osteoporosis    Paget's disease of bone    Spinal stenosis     SOCIAL HX:  Social History   Tobacco Use   Smoking status: Former Smoker    Packs/day: 0.25    Years: 2.00    Pack years: 0.50    Types: Cigarettes    Quit date: 01/02/1952    Years since quitting: 68.4   Smokeless tobacco: Never Used   Tobacco comment: smoking cessation materials not required  Substance Use Topics   Alcohol use: No    ALLERGIES:  Allergies  Allergen Reactions   Ciprofloxacin Nausea And  Vomiting   Gluten Meal Other (See Comments)    GI distress   Pioglitazone Other (See Comments)    Leg swelling   Gabapentin Diarrhea, Nausea Only and Other (See Comments)    dizziness     PERTINENT MEDICATIONS:  Outpatient Encounter Medications as of 05/26/2020  Medication Sig   ACCU-CHEK AVIVA PLUS test strip USE AS DIRECTED THREE TIMES DAILY   acetaminophen (TYLENOL) 325 MG tablet Take 650 mg by mouth every 6 (six) hours as needed.   alendronate (FOSAMAX) 70 MG tablet Take by mouth.   amLODipine (NORVASC) 5 MG tablet Take 5 mg by mouth daily.   Ascorbic Acid (VITAMIN C) 1000 MG tablet Take 1,000 mg by mouth daily.   B-D UF III MINI PEN NEEDLES 31G X 5 MM MISC USE AS DIRECTED EVERY DAY   Cholecalciferol (VITAMIN D3) 5000 units CAPS Take 50,000 Units by mouth daily. Daily    furosemide (LASIX) 40 MG tablet TK 1 T PO QD   glucose blood test strip Please dispence test strips and lancets of patient's choice.  Check sugars three times daily   LANTUS SOLOSTAR 100 UNIT/ML Solostar Pen ADMINISTER 34 UNITS UNDER THE SKIN DAILY AT 10 PM (Patient taking differently: 32-34 Units. )   lidocaine (LIDODERM) 5 % Place 1 patch onto the skin every 12 (twelve) hours. Remove & Discard patch within 12 hours or as directed by MD   losartan (COZAAR) 50 MG tablet Take 100 mg by mouth daily.    Multiple Vitamins-Minerals (PRESERVISION AREDS PO) Take by mouth.   sertraline (ZOLOFT) 100 MG tablet Take 100 mg by mouth daily.    No facility-administered encounter medications on file as of 05/26/2020.    PHYSICAL EXAM / ROS:   Current and past weights: Stable  General: NAD, frail appearing,obese Cardiovascular: no chest pain reported, 1+  LE edema  Pulmonary: no cough, no increased SOB, room air Abdomen: appetite good, denies constipation, continent of bowel, no recent A1C, reports CBG WNL. GU: denies dysuria, continent of urine MSK:  no joint and ROM abnormalities, ambulatory, has rollator for  going out Skin: no rashes or wounds reported Neurological: Weakness, chronic pain, neuropathic LE pain  Jason Coop, NP [facility]  COVID-19 PATIENT SCREENING TOOL  Person answering questions: ____________Staff_______ _____   1.  Is the patient or any family member in the home showing any signs or symptoms regarding respiratory infection?               Person with Symptom- __________NA_________________  a. Fever  Yes___ No___          ___________________  b. Shortness of breath                                                    Yes___ No___          ___________________ c. Cough/congestion                                       Yes___  No___         ___________________ d. Body aches/pains                                                         Yes___ No___        ____________________ e. Gastrointestinal symptoms (diarrhea, nausea)           Yes___ No___        ____________________  2. Within the past 14 days, has anyone living in the home had any contact with someone with or under investigation for COVID-19?    Yes___ No_X_   Person __________________

## 2020-07-05 ENCOUNTER — Other Ambulatory Visit: Payer: Medicare Other | Admitting: Primary Care

## 2020-07-05 ENCOUNTER — Telehealth: Payer: Self-pay | Admitting: Primary Care

## 2020-07-05 ENCOUNTER — Other Ambulatory Visit: Payer: Self-pay

## 2020-07-05 DIAGNOSIS — N183 Chronic kidney disease, stage 3 unspecified: Secondary | ICD-10-CM

## 2020-07-05 DIAGNOSIS — M457 Ankylosing spondylitis of lumbosacral region: Secondary | ICD-10-CM

## 2020-07-05 DIAGNOSIS — Z515 Encounter for palliative care: Secondary | ICD-10-CM

## 2020-07-05 DIAGNOSIS — M159 Polyosteoarthritis, unspecified: Secondary | ICD-10-CM

## 2020-07-05 NOTE — Progress Notes (Signed)
[facility] Consult Note Telephone: [phone]  Fax: [phone]  PATIENT NAME: Heather Long [place] [address] [phone] (home)  DOB: 10-22-1928 MRN: [id number]  PRIMARY CARE PROVIDER:    Sallee Lange, NP,  [address] [place] [address] [phone]  REFERRING PROVIDER:   Sallee Lange, NP [address] [place],  [place] [address] [phone]  RESPONSIBLE PARTY:   Extended Emergency Contact Information Primary Emergency Contact: Katricia, Prehn Address: [address]          [place], [place] [address] [place] Phone: [phone] Mobile Phone: [phone] Relation: Son  I met face to face with patient in the home.  ASSESSMENT AND RECOMMENDATIONS:   1. Advance Care Planning/Goals of Care: Goals include to maximize quality of life and symptom management. Has MOSt with DNR, limited scope, use of abx, IV use and no feeding tube.   2. Symptom Management:   Pain: No longer going to pain management, has success with gabapentin but insurance company asked her to stop using. Message out to nephrology to consult RE this medications.  Sleep: Was improved with gabapentin. Often has neuropathic pain.  Mobility: Improved, using walker in home and going out. Has had PT for let arm which has resolved pain. Denies further falls.   Nutrition: Has been skipping meals.Glucose meter has failed, I recommend her to get a new one from insurance company. Has not been taking bg recently. HgA1C= around 6-7%. Recommend to obtain dietary supplements for meal substitution. Insurance may pay for this as well. Has had MOW in the past. I encouraged her to reconsider or to consider [employer].  Hearing: Has had ears cleaned and hearing aids adjusted. This has helped [facility].   3. Follow up Palliative Care Visit: Palliative care will continue to follow for goals of care clarification and  symptom management. Return 8 weeks or prn.  4. Family /Caregiver/Community Supports: Lives in own home. Discussed asking [employer] for chore help. She will call for chore, glucose monitor and possible dietary supplements. . Son and DIL also supportive.  5. Cognitive / Functional decline: A and O x 3. Able to do many adls, needs some help with iadls.   I spent 60 minutes providing this consultation,  from 1030 to 1130. More than 50% of the time in this consultation was spent coordinating communication.   CHIEF COMPLAINT: neuropathic pain  HISTORY OF PRESENT ILLNESS:  Heather Long is a 86 y.o. year old female with multiple medical problems including DM, OA, chronic pain, neuropathic pain. Palliative Care was asked to follow this patient by consultation request of Gauger, Victoriano Lain, * to help address advance care planning and goals of care. This is a follow up visit.  CODE STATUS: DNR  PPS: 60%  HOSPICE ELIGIBILITY/DIAGNOSIS: no  PAST MEDICAL HISTORY:  Past Medical History:  Diagnosis Date  . Cancer ([facility])   . Chronic kidney disease   . Diabetes mellitus without complication ([facility])   . Gout   . Hyperlipidemia   . Hypertension   . Lymphedema   . Obesity   . Osteoporosis   . Paget's disease of bone   . Spinal stenosis     SOCIAL HX:  Social History   Tobacco Use  . Smoking status: Former Smoker    Packs/day: 0.25    Years: 2.00    Pack years: 0.50    Types: Cigarettes  Quit date: 01/02/1952    Years since quitting: 68.5  . Smokeless tobacco: Never Used  . Tobacco comment: smoking cessation materials not required  Substance Use Topics  . Alcohol use: No   FAMILY HX:  Family History  Problem Relation Age of Onset  . Diabetes Mother   . Cancer Mother   . Heart disease Father   . Throat cancer Sister   . Heart disease Brother   . Diabetes Brother   . Cancer Sister   . Heart disease Brother   . Heart disease Brother     ALLERGIES:  Allergies    Allergen Reactions  . Ciprofloxacin Nausea And Vomiting  . Gluten Meal Other (See Comments)    GI distress  . Pioglitazone Other (See Comments)    Leg swelling  . Gabapentin Diarrhea, Nausea Only and Other (See Comments)    dizziness     PERTINENT MEDICATIONS:  Outpatient Encounter Medications as of 07/05/2020  Medication Sig  . ACCU-CHEK AVIVA PLUS test strip USE AS DIRECTED THREE TIMES DAILY  . acetaminophen (TYLENOL) 325 MG tablet Take 650 mg by mouth every 6 (six) hours as needed.  . amLODipine (NORVASC) 5 MG tablet Take 2.5 mg by mouth daily.   . B-D UF III MINI PEN NEEDLES 31G X 5 MM MISC USE AS DIRECTED EVERY DAY  . Cholecalciferol (VITAMIN D3) 5000 units CAPS Take 50,000 Units by mouth daily. Daily   . furosemide (LASIX) 40 MG tablet TK 1 T PO QD  . glucose blood test strip Please dispence test strips and lancets of patient's choice.  Check sugars three times daily  . LANTUS SOLOSTAR 100 UNIT/ML Solostar Pen ADMINISTER 34 UNITS UNDER THE SKIN DAILY AT 10 PM (Patient taking differently: Inject 26 Units into the skin daily. )  . losartan (COZAAR) 50 MG tablet Take 100 mg by mouth daily.   . Multiple Vitamins-Minerals (PRESERVISION AREDS PO) Take by mouth.  . sertraline (ZOLOFT) 100 MG tablet Take 100 mg by mouth daily.   . [DISCONTINUED] alendronate (FOSAMAX) 70 MG tablet Take by mouth.  . [DISCONTINUED] Ascorbic Acid (VITAMIN C) 1000 MG tablet Take 1,000 mg by mouth daily.  . [DISCONTINUED] lidocaine (LIDODERM) 5 % Place 1 patch onto the skin every 12 (twelve) hours. Remove & Discard patch within 12 hours or as directed by MD   No facility-administered encounter medications on file as of 07/05/2020.     PHYSICAL EXAM / ROS:   147/70 HR 65 RR 20 Current and past weights: 194 lbs; States she was 230 lbs a year ago. General: NAD, frail appearing, obese Cardiovascular: no chest pain reported, no LE edema  Pulmonary: no cough, no increased SOB, room air Abdomen: appetite fair,  denies constipation, continent of bowel GU: denies dysuria, incontinent of urine MSK:  ++ joint and ROM abnormalities, ambulatory with walker  Skin: no rashes or wounds reported Neurological: Weakness, chronic neuropathic pain  Kathryn McKelvey Smith, NP , DNP, MPH, A[facility]  COVID-19 PATIENT SCREENING TOOL  Person answering questions: ___________self____ _____   1.  Is the patient or any family member in the home showing any signs or symptoms regarding respiratory infection?               Person with Symptom- __________NA_________________  a. Fever                                                                            Yes___ No___          ___________________  b. Shortness of breath                                                    Yes___ No___          ___________________ c. Cough/congestion                                       Yes___  No___         ___________________ d. Body aches/pains                                                         Yes___ No___        ____________________ e. Gastrointestinal symptoms (diarrhea, nausea)           Yes___ No___        ____________________  2. Within the past 14 days, has anyone living in the home had any contact with someone with or under investigation for COVID-19?    Yes___ No_X_   Person __________________

## 2020-07-05 NOTE — Telephone Encounter (Signed)
T/c to pt message left. Nephrology has said 100 mg of gabapentin is acceptable for her to take with her current renal function.

## 2020-09-04 IMAGING — DX DG SHOULDER 2+V*L*
3 series · 3 of 3 positions shown · non-contrast
Comparison: None.

CLINICAL DATA: Left-sided shoulder pain following fall

EXAM:
LEFT SHOULDER - 2+ VIEW

[shoulder ap (1 of 2)]
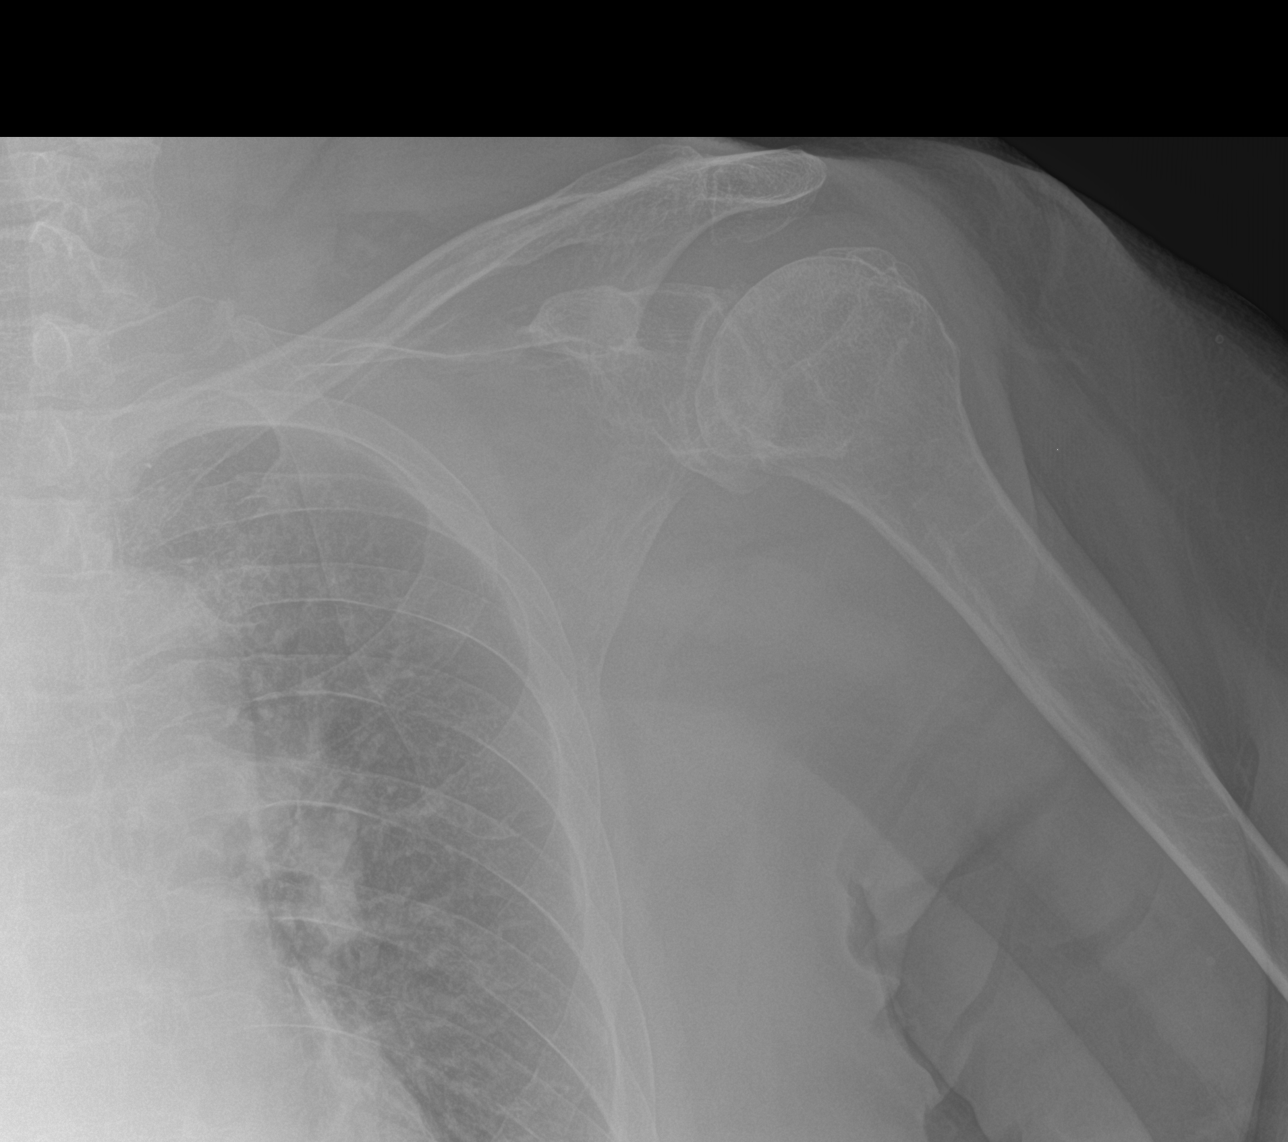

[shoulder ap (2 of 2)]
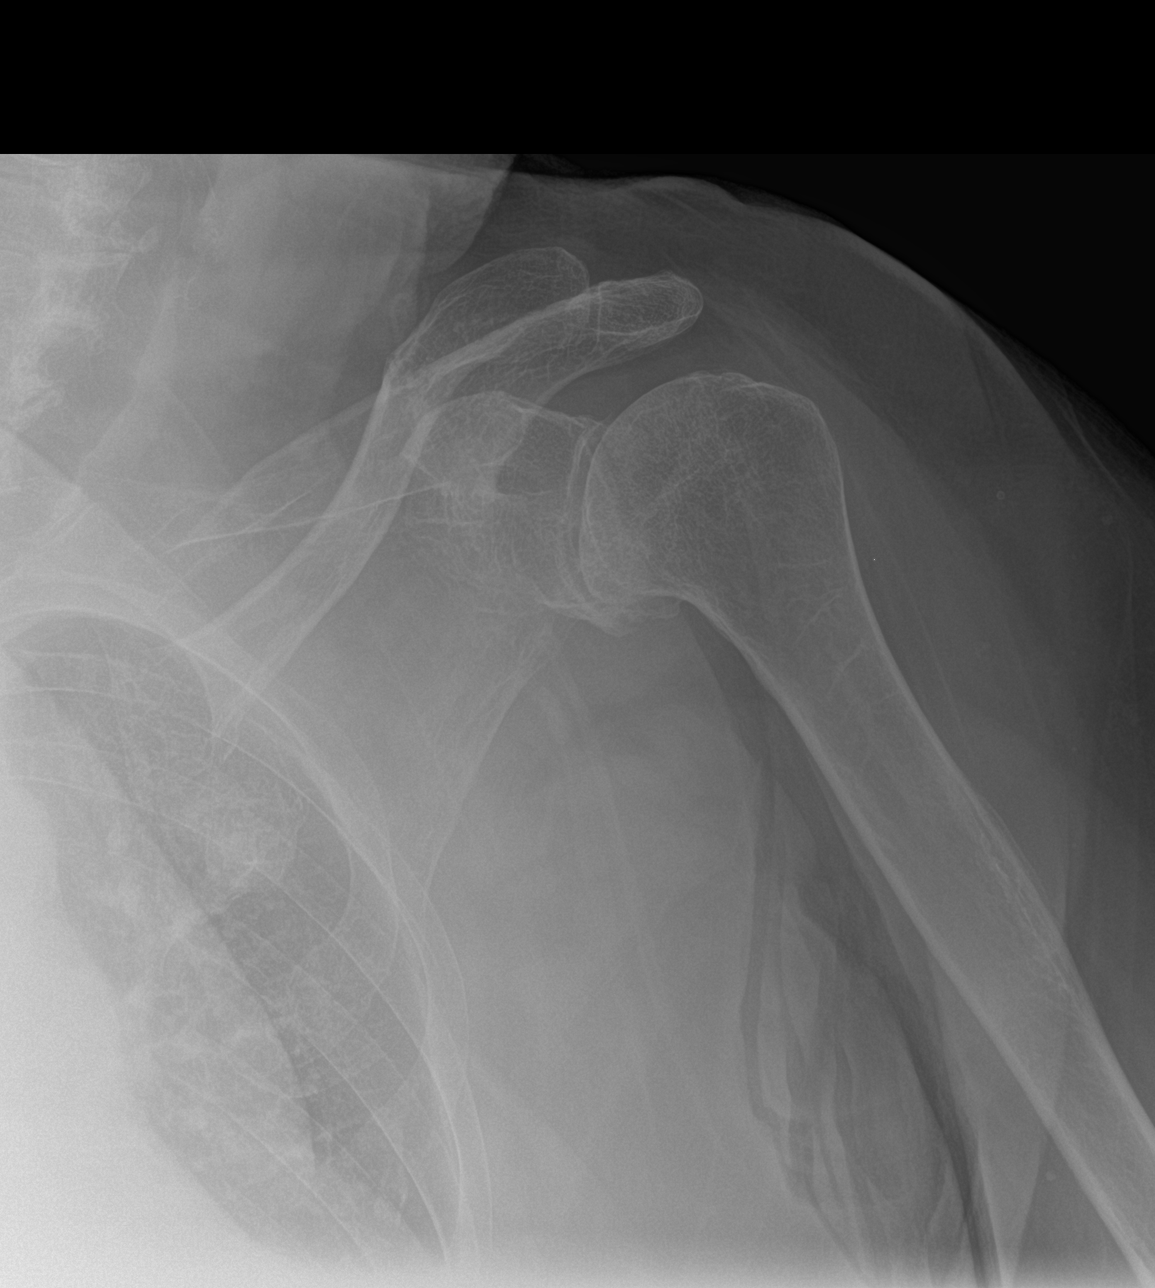

[shoulder obl]
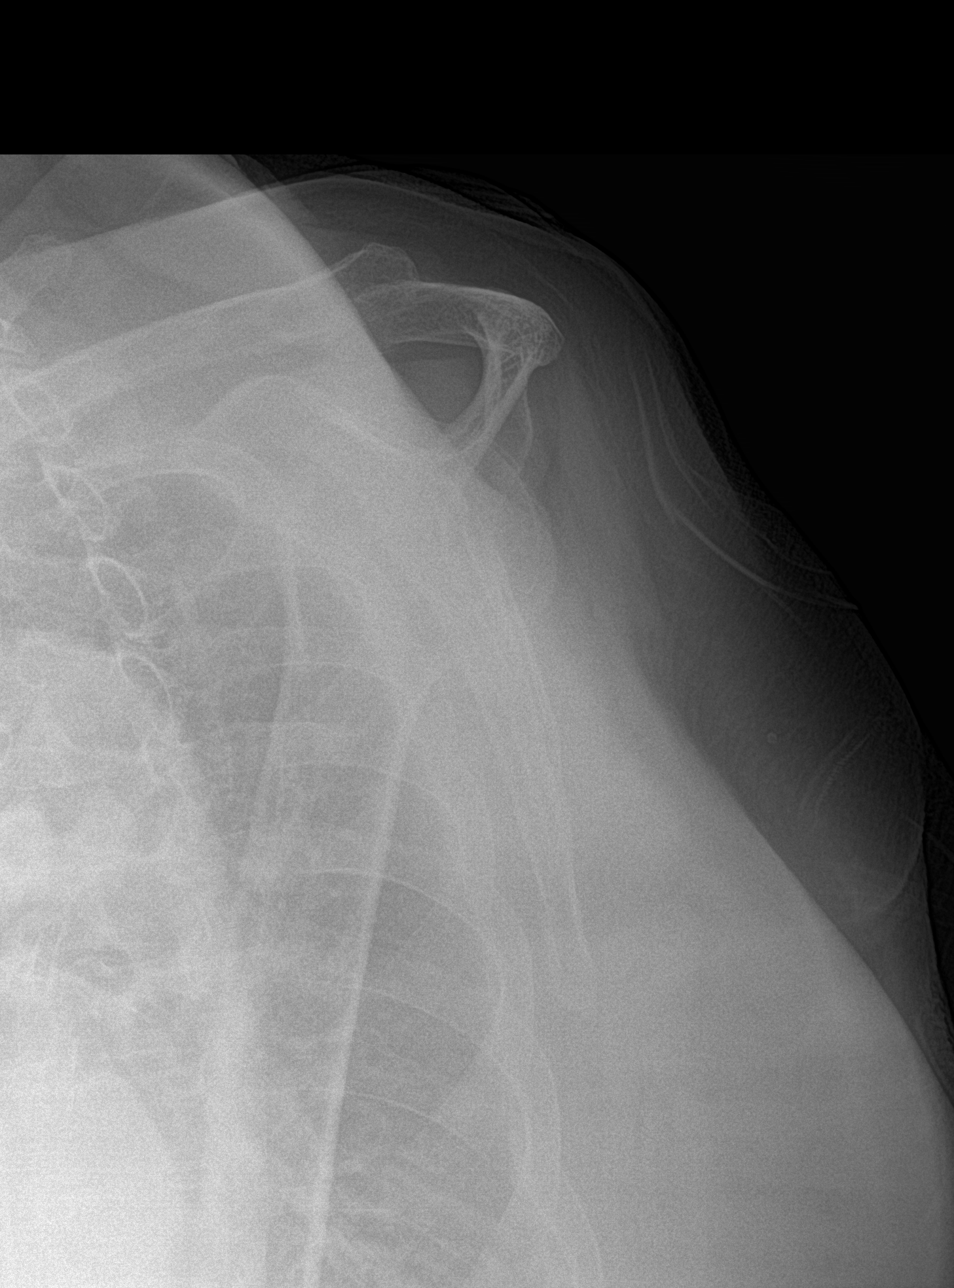

[3 of 3 positions shown; findings below may reference images not displayed]

FINDINGS: Degenerative changes of the glenohumeral joint are noted. No acute
fracture or dislocation is seen. Underlying bony thorax is within
normal limits. No soft tissue changes are seen.
IMPRESSION: Degenerative changes of the shoulder without acute abnormality

## 2020-09-04 IMAGING — DX DG KNEE COMPLETE 4+V*R*
4 series · 4 of 4 positions shown · non-contrast
Comparison: 08/18/2010

CLINICAL DATA: Recent fall with right knee pain, initial encounter

EXAM:
RIGHT KNEE - COMPLETE 4+ VIEW

[knee ap]
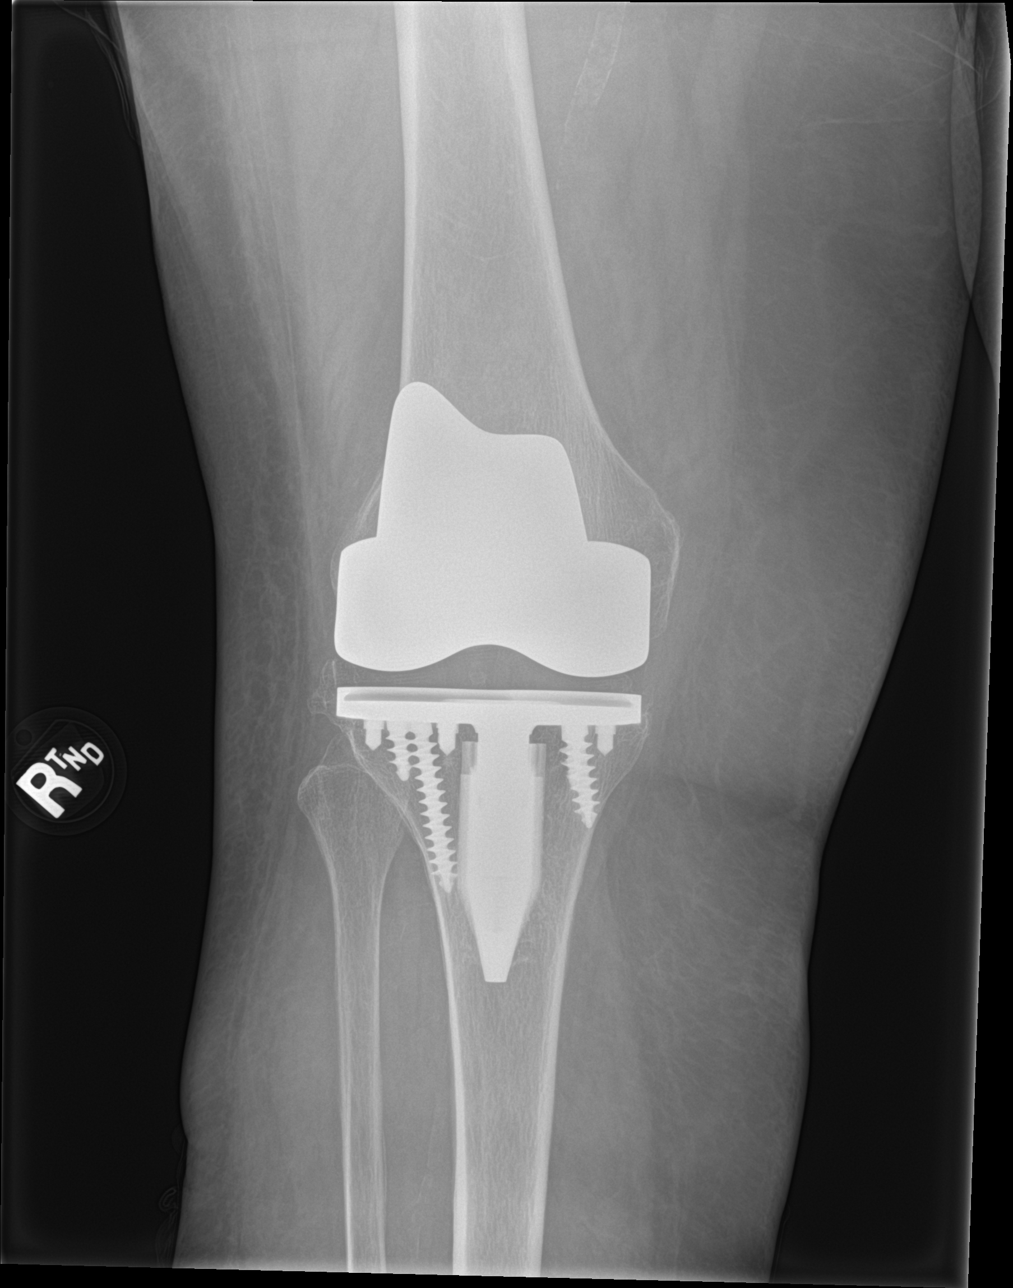

[knee obl (1 of 2)]
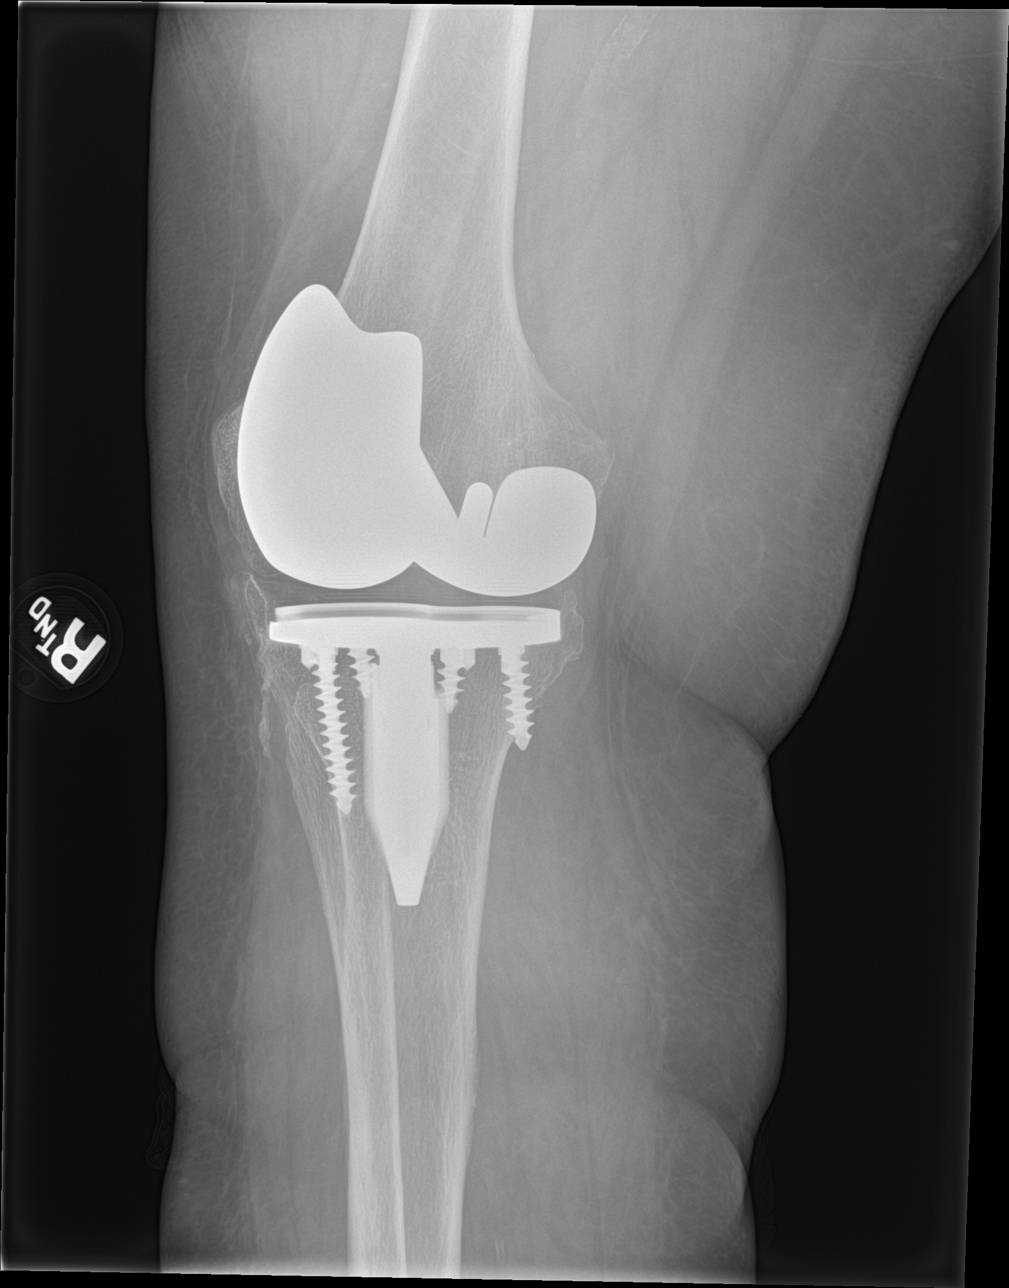

[knee lat]
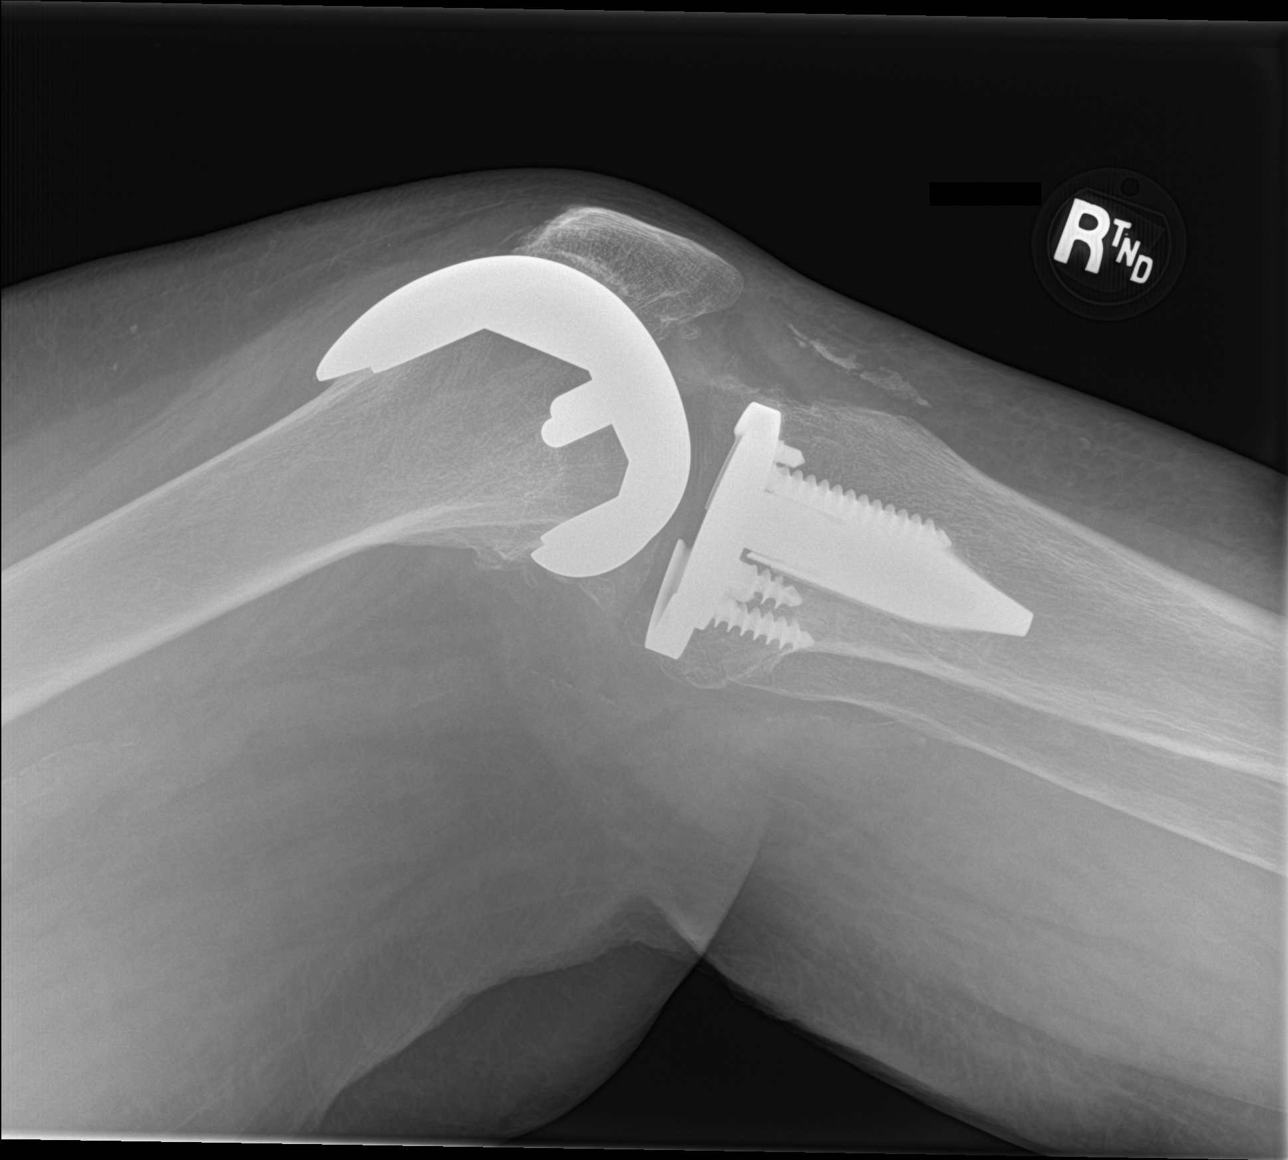

[knee obl (2 of 2)]
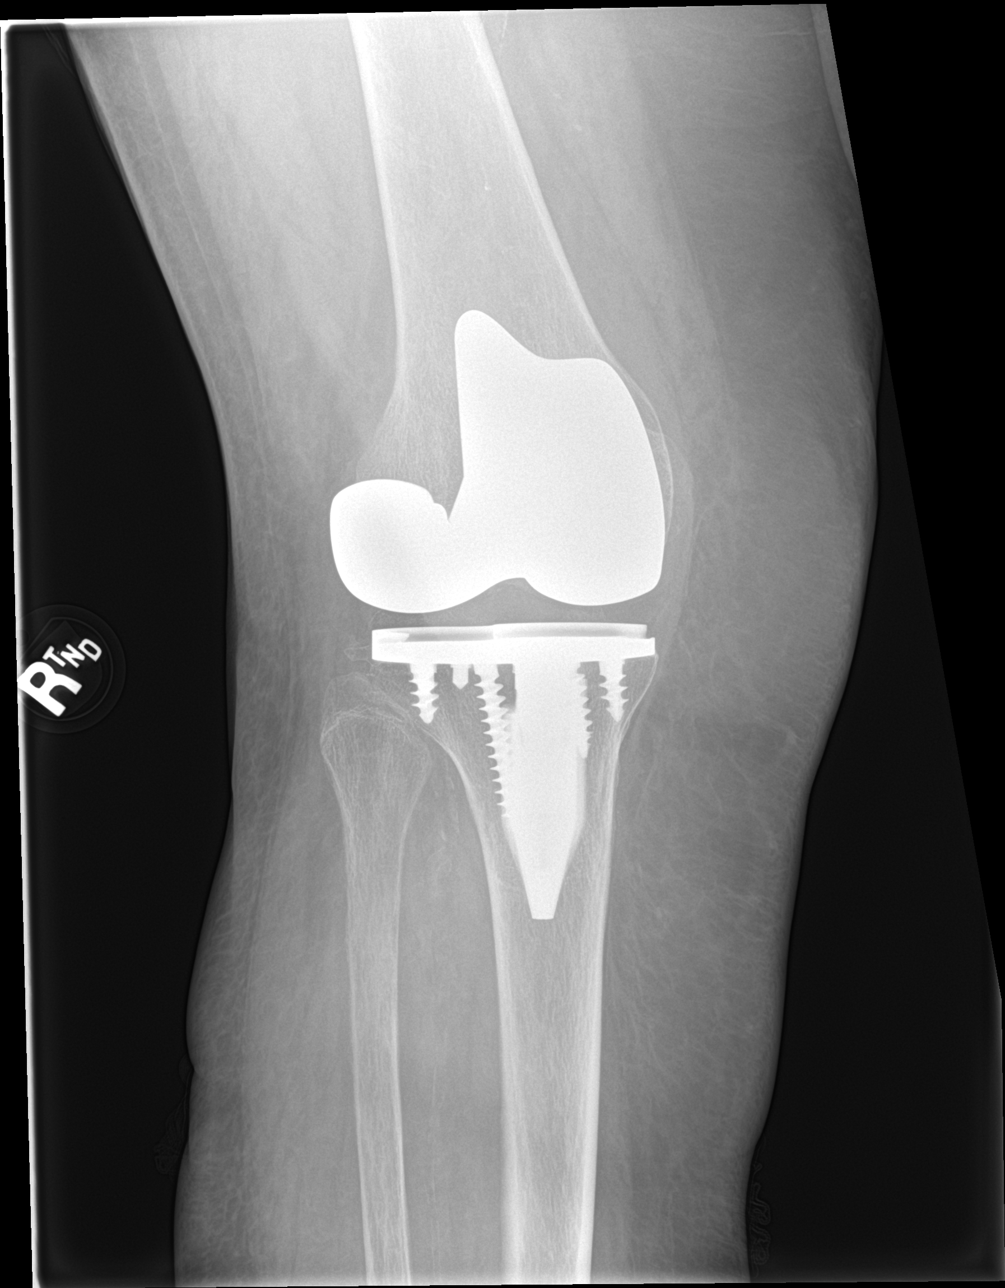

[4 of 4 positions shown; findings below may reference images not displayed]

FINDINGS: Changes of right knee replacement are again seen and stable. No
changes to suggest acute fracture or dislocation are noted. No
loosening is seen. No joint effusion is noted.
IMPRESSION: Postsurgical change without acute abnormality.

## 2020-09-08 ENCOUNTER — Other Ambulatory Visit: Payer: Self-pay

## 2020-09-08 ENCOUNTER — Other Ambulatory Visit: Payer: Medicare Other | Admitting: Primary Care

## 2020-09-08 DIAGNOSIS — Z515 Encounter for palliative care: Secondary | ICD-10-CM

## 2020-09-08 DIAGNOSIS — N183 Chronic kidney disease, stage 3 unspecified: Secondary | ICD-10-CM

## 2020-09-08 DIAGNOSIS — M457 Ankylosing spondylitis of lumbosacral region: Secondary | ICD-10-CM

## 2020-09-08 NOTE — Progress Notes (Signed)
[facility] Consult Note Telephone: [phone]  Fax: [phone]  PATIENT NAME: Heather Long [place] [id number] (home)  DOB: Dec 84, 1929 MRN: [id number]  PRIMARY CARE PROVIDER:    Sallee Lange, NP,  [address] [place] [address] [phone]  REFERRING PROVIDER:   Sallee Lange, NP [address] [place],  [place] [address] [phone]  RESPONSIBLE PARTY:   Extended Emergency Contact Information Primary Emergency Contact: Heather Long Address: [address]          [place], [place] [address] [place] Phone: [phone] Mobile Phone: [phone] Relation: Son  I met face to face with patient in her home.  ASSESSMENT AND RECOMMENDATIONS:   1. Advance Care Planning/Goals of Care: Goals include to maximize quality of life and symptom management. Our advance care planning conversation included a discussion about:    Aging in place and needed services. She has some difficulty with adls in her home, and is in need of chore services. She has slightly too much income for medicaid.   2. Symptom Management:  Renal function:  [phone] x [phone] x [phone]. Is her renal management team from [place]. Her last GFR did show some decrease in function from 6 months prior.  HTN: We reviewed all meds, she has some questions. She is taking 5 mg amlodipine vs the 2.5 mg ordered. She was unclear on the fact this was to be halved, and her dexterity is such she has a hard time doing it. So she takes the entire 5 mg daily. She had not been doing home bp assessments despite having 2  electronic sphygmomanometers. We got them out and she checked her bp and thinks she will be able to use one for periodic checks.   Home Management: She could benefit from a chore type service although she is able to keep her home clean and safe. She endorses fatigue at performing these type iadls. I  asked her to inquire from [place] as some plans do cover a personal care worker.  Mood: Endorses this is good although she does have problems with periodic pain and poor sleep. She is taking sertraline. We discussed its benefits as she was debating to d/c. We discussed its advantages for mood, sleep, pain and appetite enhancement. She will continue.  3. Follow up Palliative Care Visit: Palliative care will continue to follow for goals of care clarification and symptom management. Return 8 weeks or prn.  4. Family /Caregiver/Community Supports: Son is POA, helps with appts. Declines MOW. Discussed [employer]. Discussed CHORE/CAP but income is too great.  5. Cognitive / Functional decline: A and O x 3, cognitively intact. Needs assistance with some adls, ialds.  I spent 60 minutes providing this consultation,  from 1045 to 1145. More than 50% of the time in this consultation was spent coordinating communication.   CHIEF COMPLAINT: self care deficits, med compliance  HISTORY OF PRESENT ILLNESS:  Heather Long is a 86 y.o. year old female  with DM, obesity, HTN, fall risk . We are asked to consult around chronic disease management, advance care planning  and goals of care.    Review or lab tests: reviewed creatinine levels, GFR which is reducing, and A1C, which is 6.1%.  Palliative Care was asked to follow this patient by consultation request of Heather Long, Heather Long, * to help address advance care planning and goals of care. This is a follow up  visit.  CODE STATUS: DNR, limited scope of care, use of abx and IV, no feeding tube.  PPS: 60%  HOSPICE ELIGIBILITY/DIAGNOSIS: no  ROS  General: NAD EYES: endorses vision changes, wears glasses ENMT: denies dysphagia Cardiovascular: denies chest pain, states some h/o which has resolved Pulmonary: denies  cough, denies increased SOB  Abdomen: endorses good appetite,  endorses continence of bowel GU: denies dysuria, endorses  continence of urine MSK:  endorses ROM limitations, no falls reported, ambulates with rollator Skin: denies rashes or wounds Neurological: endorses chronic pain, endorses insomnia Psych: Endorses positive mood   Physical Exam: Current and past weights: 194 lbs. Constitutional: 140/78 HR 78 RR 18 General :frail appearing, Obese  ENMT: hard of hearing,oral mucous membranes moist, dentition intact CV:  RRR, no LE edema Pulmonary:  no increased work of breathing, no cough, no audible wheezes, room air Abdomen: intake 100%, no ascites GU: deferred MSK: mild sacropenia, decreased ROM in all extremities d/t OA, ambulatory Skin: warm and dry, no rashes or wounds on visible skin Neuro: no cognitive impairment, grossly non -focal Psych: non -anxious affect, A and O x 3   CURRENT PROBLEM LIST:  Patient Active Problem List   Diagnosis Date Noted   Lumbar spondylosis 05/14/2019   Lumbar facet arthropathy 05/14/2019   SI joint arthritis 05/14/2019   Chronic pain syndrome 04/16/2019   Fibromyalgia 04/16/2019   Lumbar degenerative disc disease 04/16/2019   Lumbar facet joint syndrome 04/16/2019   Chronic SI joint pain 04/16/2019   Ankylosing spondylitis of lumbosacral region ([place]) 04/16/2019   CKD (chronic kidney disease) stage 3, GFR 30-59 ml/min (HCC) 04/16/2019   Lymphedema 09/17/2018   Long term current use of insulin ([place]) 09/12/2018   Rotator cuff disorder, right 04/15/2018   Secondary hyperparathyroidism of renal origin ([place]) 10/16/2017   Venous stasis dermatitis of left lower extremity 07/16/2017   Chronic congestion of paranasal sinus 03/13/2017   Degeneration macular 05/23/2016   Osteitis deformans 05/23/2016   Neoplasm of uncertain behavior of skin of face 05/23/2016   Psoriasis 05/23/2016   Vitamin B12 deficiency 05/16/2016   Obesity, Class III, BMI 40-49.9 (morbid obesity) ([place]) 05/02/2016   Type 2 DM with CKD stage 4 and hypertension ([place])  05/02/2016   Vitamin D deficiency 05/02/2016   Lower extremity edema 05/02/2016   Spinal stenosis of lumbar region 05/02/2016   Essential (primary) hypertension 06/02/2012   Hyperlipidemia associated with type 2 diabetes mellitus ([place]) 06/02/2012   History of breast cancer 06/14/2011   Generalized OA 06/14/2011   Lymphedema of arm 06/14/2011   OP (osteoporosis) 06/14/2011   PAST MEDICAL HISTORY:  Past Medical History:  Diagnosis Date   Cancer ([facility])    Chronic kidney disease    Diabetes mellitus without complication (HCC)    Gout    Hyperlipidemia    Hypertension    Lymphedema    Obesity    Osteoporosis    Paget's disease of bone    Spinal stenosis     SOCIAL HX:  Social History   Tobacco Use   Smoking status: Former Smoker    Packs/day: 0.25    Years: 2.00    Pack years: 0.50    Types: Cigarettes    Quit date: 01/02/1952    Years since quitting: 68.7   Smokeless tobacco: Never Used   Tobacco comment: smoking cessation materials not required  Substance Use Topics   Alcohol use: No   FAMILY HX:  Family History  Problem Relation Age of Onset  Diabetes Mother    Cancer Mother    Heart disease Father    Throat cancer Sister    Heart disease Brother    Diabetes Brother    Cancer Sister    Heart disease Brother    Heart disease Brother     ALLERGIES:  Allergies  Allergen Reactions   Ciprofloxacin Nausea And Vomiting   Gluten Meal Other (See Comments)    GI distress   Pioglitazone Other (See Comments)    Leg swelling   Gabapentin Diarrhea, Nausea Only and Other (See Comments)    dizziness     PERTINENT MEDICATIONS:  Outpatient Encounter Medications as of 09/08/2020  Medication Sig   ACCU-CHEK AVIVA PLUS test strip USE AS DIRECTED THREE TIMES DAILY   acetaminophen (TYLENOL) 325 MG tablet Take 650 mg by mouth every 6 (six) hours as needed.   amLODipine (NORVASC) 5 MG tablet Take 2.5 mg by mouth daily.    B-D UF  III MINI PEN NEEDLES 31G X 5 MM MISC USE AS DIRECTED EVERY DAY   Cholecalciferol (VITAMIN D3) 5000 units CAPS Take 50,000 Units by mouth daily. Daily    furosemide (LASIX) 40 MG tablet TK 1 T PO QD   glucose blood test strip Please dispence test strips and lancets of patient's choice.  Check sugars three times daily   LANTUS SOLOSTAR 100 UNIT/ML Solostar Pen ADMINISTER 34 UNITS UNDER THE SKIN DAILY AT 10 PM (Patient taking differently: Inject 26 Units into the skin daily. )   losartan (COZAAR) 50 MG tablet Take 100 mg by mouth daily.    Multiple Vitamins-Minerals (PRESERVISION AREDS PO) Take by mouth.   sertraline (ZOLOFT) 100 MG tablet Take 100 mg by mouth daily.    No facility-administered encounter medications on file as of 09/08/2020.     Jason Coop, NP , DNP, MPH, AGPCNP-BC, ACHPN  COVID-19 PATIENT SCREENING TOOL  Person answering questions: ____________staff______ _____   1.  Is the patient or any family member in the home showing any signs or symptoms regarding respiratory infection?               Person with Symptom- __________NA_________________  a. Fever                                                                          Yes___ No___          ___________________  b. Shortness of breath                                                    Yes___ No___          ___________________ c. Cough/congestion                                       Yes___  No___         ___________________ d. Body aches/pains  Yes___ No___        ____________________ e. Gastrointestinal symptoms (diarrhea, nausea)           Yes___ No___        ____________________  2. Within the past 14 days, has anyone living in the home had any contact with someone with or under investigation for COVID-19?    Yes___ No_X_   Person __________________

## 2020-10-11 ENCOUNTER — Telehealth: Payer: Self-pay | Admitting: Primary Care

## 2020-10-12 NOTE — Telephone Encounter (Signed)
error 

## 2020-11-07 ENCOUNTER — Other Ambulatory Visit: Payer: Medicare Other | Admitting: Primary Care

## 2020-11-07 ENCOUNTER — Other Ambulatory Visit: Payer: Self-pay

## 2020-11-07 DIAGNOSIS — Z515 Encounter for palliative care: Secondary | ICD-10-CM

## 2020-11-07 DIAGNOSIS — N183 Chronic kidney disease, stage 3 unspecified: Secondary | ICD-10-CM

## 2020-11-07 DIAGNOSIS — M457 Ankylosing spondylitis of lumbosacral region: Secondary | ICD-10-CM

## 2020-11-07 NOTE — Progress Notes (Signed)
[facility] Consult Note Telephone: [phone]  Fax: [phone]     Date of encounter: 11/07/20 PATIENT NAME: Heather Long [id number] [phone] (home)  DOB: [birth year] MRN: [id number]  PRIMARY CARE PROVIDER:    Sallee Lange, NP,  [address] [place] [address] [phone]  REFERRING PROVIDER:   Sallee Lange, NP [address] [place],  [place] [address] [phone]  RESPONSIBLE PARTY:   Extended Emergency Contact Information Primary Emergency Contact: Heather Long Address: [address]          [place], [place] [address] [place] Phone: [phone] Mobile Phone: [phone] Relation: Son  I met face to face with patient  in  Home. Palliative Care was asked to follow this patient by consultation request of Heather Long, * to help address advance care planning and goals of care. This is a follow up  visit.   ASSESSMENT AND RECOMMENDATIONS:    1. Advance Care Planning/Goals of Care: Goals include to maximize quality of life and symptom management. DNR in home. No changes in MOST. DNR, limited interventions, abx use, iv use, no feeding tube.  Discussed caregiving needs is hiring a new person to help with adls and iadls.   2. Symptom Management:   Caregiver strain: She voices her main need is help with caregiving. She is doing most of her own care and has fallen frequently as of late. She states she has applied for Medicaid but does not qualify for the in-home PCS service. Apparently she may have her Medicare benefit being paid under Medicaid. She was concerned that there was verbiage that she needed to be put into a nursing home. We discussed her being able to stay in place, as she is ambulatory and not cognitively impaired. She does need some help with house work as well as personal care supervision in the shower. I have reached out to our  Education officer, museum by email to help assist in clarifying programs for which patient is eligible to be supported in the home.    Pain: She endorses pain in her shoulder from a fall in the summer. She endorses having fallen several more times recently. We discussed safety measures such as sitting down to dress,  and  getting reachers so that she doesnt have to bend over  to pick up items off the floor. Shes currently seen by [facility] home health and OT could help her with this item. Recommend ATC CR acetaminophen if not currently taking.  Nutrition patient is on a very limited income of under $1100 a month and does have a mortgage. She is food insecure. I have made a referral to [employer], a request for help with  meal prep and meal purchase. She is receiving food stamps at $67 per month.   Mood: patient is very discouraged about the complexity of the social assistance system. She also needs to renew her drivers license and is concerned about how to get to the [facility] and concerned about having to take a test. Her daughter-in-law was helping her apply for a license in the mail but that was denied. She will phone DMV. She drives to go to the drugstore and to the grocery and this is necessary for her to continue to provide self-care.  Diabetes: Is well controlled for now.  3. Follow up Palliative Care Visit: Palliative care will continue to follow for  goals of care clarification and symptom management. Return 6-8 weeks or prn.  4. Family /Caregiver/Community Supports: Son and DIL are in area. Referred to [employer]. Hiring in home care.  5. Cognitive / Functional decline: A and O x 3 but anxious about care needs and onerous navigation of insurance and social service applications. (I) in most adls and iadls, needs some assistance from in home helpers.  I spent 60 minutes providing this consultation,  from 1300 to 1400. More than 50% of the time in this consultation was spent  coordinating communication.   CODE STATUS: DNR   PPS: 50%  HOSPICE ELIGIBILITY/DIAGNOSIS: no  Subjective:  CHIEF COMPLAINT: frequent falls, pain  HISTORY OF PRESENT ILLNESS:  Heather Long is a 86 y.o. year old female  with baseline DM, obesity, frequent falls. Has joint pain from falls and self care deficits. Today she is concerned RE caregiver issues and finances for self care. She is worried that someone will put her in a nursing home.   We are asked to consult around advance care planning and complex medical decision making.  History obtained from review of EMR, discussion with primary team, and  interview with family, caregiver  and/or Heather Long No. Records reviewed and summarized above.   CURRENT PROBLEM LIST:  Patient Active Problem List   Diagnosis Date Noted   Lumbar spondylosis 05/14/2019   Lumbar facet arthropathy 05/14/2019   SI joint arthritis 05/14/2019   Chronic pain syndrome 04/16/2019   Fibromyalgia 04/16/2019   Lumbar degenerative disc disease 04/16/2019   Lumbar facet joint syndrome 04/16/2019   Chronic SI joint pain 04/16/2019   Ankylosing spondylitis of lumbosacral region ([place]) 04/16/2019   CKD (chronic kidney disease) stage 3, GFR 30-59 ml/min (HCC) 04/16/2019   Lymphedema 09/17/2018   Long term current use of insulin ([place]) 09/12/2018   Rotator cuff disorder, right 04/15/2018   Secondary hyperparathyroidism of renal origin ([place]) 10/16/2017   Venous stasis dermatitis of left lower extremity 07/16/2017   Chronic congestion of paranasal sinus 03/13/2017   Degeneration macular 05/23/2016   Osteitis deformans 05/23/2016   Neoplasm of uncertain behavior of skin of face 05/23/2016   Psoriasis 05/23/2016   Vitamin B12 deficiency 05/16/2016   Obesity, Class III, BMI 40-49.9 (morbid obesity) ([place]) 05/02/2016   Type 2 DM with CKD stage 4 and hypertension ([place]) 05/02/2016   Vitamin D deficiency 05/02/2016   Lower extremity edema  05/02/2016   Spinal stenosis of lumbar region 05/02/2016   Essential (primary) hypertension 06/02/2012   Hyperlipidemia associated with type 2 diabetes mellitus ([place]) 06/02/2012   History of breast cancer 06/14/2011   Generalized OA 06/14/2011   Lymphedema of arm 06/14/2011   OP (osteoporosis) 06/14/2011   PAST MEDICAL HISTORY:  Active Ambulatory Problems    Diagnosis Date Noted   Obesity, Class III, BMI 40-49.9 (morbid obesity) ([place]) 05/02/2016   Type 2 DM with CKD stage 4 and hypertension ([place]) 05/02/2016   Vitamin D deficiency 05/02/2016   Lower extremity edema 05/02/2016   Spinal stenosis of lumbar region 05/02/2016   Vitamin B12 deficiency 05/16/2016   History of breast cancer 06/14/2011   Essential (primary) hypertension 06/02/2012   Generalized OA 06/14/2011   Hyperlipidemia associated with type 2 diabetes mellitus ([place]) 06/02/2012   Lymphedema of arm 06/14/2011   Degeneration macular 05/23/2016   OP (osteoporosis) 06/14/2011   Osteitis deformans 05/23/2016   Neoplasm of uncertain behavior of skin of face 05/23/2016   Psoriasis 05/23/2016   Chronic congestion of paranasal  sinus 03/13/2017   Venous stasis dermatitis of left lower extremity 07/16/2017   Secondary hyperparathyroidism of renal origin ([place]) 10/16/2017   Rotator cuff disorder, right 04/15/2018   Long term current use of insulin ([place]) 09/12/2018   Lymphedema 09/17/2018   Chronic pain syndrome 04/16/2019   Fibromyalgia 04/16/2019   Lumbar degenerative disc disease 04/16/2019   Lumbar facet joint syndrome 04/16/2019   Chronic SI joint pain 04/16/2019   Ankylosing spondylitis of lumbosacral region ([place]) 04/16/2019   CKD (chronic kidney disease) stage 3, GFR 30-59 ml/min (HCC) 04/16/2019   Lumbar spondylosis 05/14/2019   Lumbar facet arthropathy 05/14/2019   SI joint arthritis 05/14/2019   Resolved Ambulatory Problems    Diagnosis Date Noted   CKD (chronic kidney  disease) stage 3, GFR 30-59 ml/min ([place]) 05/02/2016   Bilateral impacted cerumen 07/16/2017   Past Medical History:  Diagnosis Date   Cancer ([place])    Chronic kidney disease    Diabetes mellitus without complication (HCC)    Gout    Hyperlipidemia    Hypertension    Obesity    Osteoporosis    Paget's disease of bone    Spinal stenosis    SOCIAL HX:  Social History   Tobacco Use   Smoking status: Former Smoker    Packs/day: 0.25    Years: 2.00    Pack years: 0.50    Types: Cigarettes    Quit date: 01/02/1952    Years since quitting: 68.8   Smokeless tobacco: Never Used   Tobacco comment: smoking cessation materials not required  Substance Use Topics   Alcohol use: No   FAMILY HX:  Family History  Problem Relation Age of Onset   Diabetes Mother    Cancer Mother    Heart disease Father    Throat cancer Sister    Heart disease Brother    Diabetes Brother    Cancer Sister    Heart disease Brother    Heart disease Brother       ALLERGIES:  Allergies  Allergen Reactions   Ciprofloxacin Nausea And Vomiting   Gluten Meal Other (See Comments)    GI distress   Pioglitazone Other (See Comments)    Leg swelling   Gabapentin Diarrhea, Nausea Only and Other (See Comments)    dizziness     PERTINENT MEDICATIONS:  Outpatient Encounter Medications as of 11/07/2020  Medication Sig   ACCU-CHEK AVIVA PLUS test strip USE AS DIRECTED THREE TIMES DAILY   acetaminophen (TYLENOL) 325 MG tablet Take 650 mg by mouth every 6 (six) hours as needed.   amLODipine (NORVASC) 5 MG tablet Take 2.5 mg by mouth daily.    B-D UF III MINI PEN NEEDLES 31G X 5 MM MISC USE AS DIRECTED EVERY DAY   Cholecalciferol (VITAMIN D3) 5000 units CAPS Take 50,000 Units by mouth daily. Daily    furosemide (LASIX) 40 MG tablet TK 1 T PO QD   glucose blood test strip Please dispence test strips and lancets of patient's choice.  Check sugars three times daily   LANTUS  SOLOSTAR 100 UNIT/ML Solostar Pen ADMINISTER 34 UNITS UNDER THE SKIN DAILY AT 10 PM (Patient taking differently: Inject 26 Units into the skin daily. )   losartan (COZAAR) 50 MG tablet Take 100 mg by mouth daily.    Multiple Vitamins-Minerals (PRESERVISION AREDS PO) Take by mouth.   sertraline (ZOLOFT) 100 MG tablet Take 100 mg by mouth daily.    No facility-administered encounter medications on file as of  11/07/2020.    Objective: ROS  General: NAD EYES: denies vision changes, endorses dry eyes, wears glasses ENMT: denies dysphagia Cardiovascular: denies chest pain Pulmonary: denies  cough, denies increased SOB Abdomen: endorses good appetite, denies constipation, endorses continence of bowel GU: denies dysuria, endorses continence of urine MSK:  endorses ROM limitations, esp L shoulder, multiple falls reported Skin: denies rashes or wounds Neurological: endorses weakness, endorses joint pain, denies insomnia Psych: Endorses discouraged mood  Heme/lymph/immuno: denies bruises, abnormal bleeding  Physical Exam: Current and past weights: 194 lbs, lost 40 lbs Constitutional: NAD General: frail appearing, obese  EYES: anicteric sclera,lids intact, no discharge  ENMT: intact hearing,oral mucous membranes moist, dentition intact CV:  bil LE lymphedema, pitting edema 2+ Pulmonary:  no increased work of breathing, no cough, no audible wheezes, room air Abdomen: intake 100%,  no ascites MSK: mild sarcopenia, decreased ROM in all extremities,  Ambulatory with walker  Skin: warm and dry, no rashes or wounds on visible skin Neuro: Generalized weakness, no cognitive impairment, grossly non -focal Psych: anxious affect, A and O x 3 Hem/lymph/immuno: no widespread bruising   Thank you for the opportunity to participate in the care of Heather Long No.  The palliative care team will continue to follow. Please call our office at [phone] if we can be of additional assistance.  Jason Coop, NP , DNP, MPH, AGPCNP-BC, ACHPN  COVID-19 PATIENT SCREENING TOOL  Person answering questions: ____________self______ _____   1.  Is the patient or any family member in the home showing any signs or symptoms regarding respiratory infection?               Person with Symptom- __________NA_________________  a. Fever                                                                          Yes___ No___          ___________________  b. Shortness of breath                                                    Yes___ No___          ___________________ c. Cough/congestion                                       Yes___  No___         ___________________ d. Body aches/pains                                                         Yes___ No___        ____________________ e. Gastrointestinal symptoms (diarrhea, nausea)           Yes___ No___        ____________________  2. Within the past 14 days, has anyone  living in the home had any contact with someone with or under investigation for COVID-19?    Yes___ No_X_   Person __________________

## 2020-11-08 ENCOUNTER — Other Ambulatory Visit: Payer: Medicare Other | Admitting: Primary Care

## 2021-01-03 ENCOUNTER — Other Ambulatory Visit: Payer: Medicare Other | Admitting: Primary Care

## 2021-01-03 ENCOUNTER — Other Ambulatory Visit: Payer: Self-pay

## 2021-01-03 DIAGNOSIS — M159 Polyosteoarthritis, unspecified: Secondary | ICD-10-CM

## 2021-01-03 DIAGNOSIS — N183 Chronic kidney disease, stage 3 unspecified: Secondary | ICD-10-CM

## 2021-01-03 DIAGNOSIS — M457 Ankylosing spondylitis of lumbosacral region: Secondary | ICD-10-CM

## 2021-01-03 NOTE — Progress Notes (Signed)
[facility] Consult Note Telephone: [phone]  Fax: [phone]    Date of encounter: 01/03/21 PATIENT NAME: Heather Long [address] [place] [address] [phone] (home)  DOB: 26-Jul-1928 MRN: [id number]  PRIMARY CARE PROVIDER:    Sallee Lange, NP,  [address] [place] [address] [phone]  REFERRING PROVIDER:   Sallee Lange, NP [address] [place],  [place] [address] [phone]  RESPONSIBLE PARTY:   Extended Emergency Contact Information Primary Emergency Contact: Deah, [place] Address: [address]          [place], [place] [address] [place] Phone: [phone] Mobile Phone: [phone] Relation: Son  I met face to face with patient  home. Palliative Care was asked to follow this patient by consultation request of Gauger, Victoriano Lain, * to help address advance care planning and goals of care. This is a follow up  visit.   ASSESSMENT AND RECOMMENDATIONS:    1. Advance Care Planning/Goals of Care: Goals include to maximize quality of life and symptom management. DNR on file   2. Symptom Management:  I visited with patient in her home.   She endorses several falls but did not follow up with her primary provider. She said she had bruising on her head from three falls but no long-term sequelae. She does endorse a chronic periodic daily five minute headache. I have encouraged her to reach out to primary provider to discuss any needed follow up. We discussed safety measures and ambulating with her walker. One of her falls however was over the wheel of her walker. She endorses doing home exercises to help her stamina and flexibility. Edema in her lower extremities is  3+.   She has had trouble finding suitable footwear. Her daughter in law has found a larger size for when her feet are more swollen and so these have been ordered for her. We discussed the safety of  having well- fitting shoes. Currently she is using some tennis shoes without the laces.  BP runs reliably normotensive. Has had a few falls and hit her head.  Endorses headache in whole head lasting several minutes.  It is very painful and subsides in a few minutes. This happens every other day.   Vision: Endorses poor vision now, and has trouble reading. Has had some periods of white flashes. Uses rollator in house and has someone accompany her out. She has given up driving due to her eyesight. Endorses dry eyes.  Covid prevention: Does not intend to be immunized, and family is not immunized.    Falls: Endorses 3 falls in 2 weeks, with head bruising. Denies any injuries or sequelae.   3. Follow up Palliative [place]: Palliative care will continue to follow for goals of care clarification and symptom management. Return 6-8 weeks or prn.  4. Family /Caregiver/Community Supports: Son Jeanne Ivan is POA, lives alone in town, in own home.  5. Cognitive / Functional decline: A and O x 3, needs assistance with adls and iadls.  I spent 60 minutes providing this consultation,  from 1600 to 1700. More than 50% of the time in this consultation was spent in counseling and care coordination.  CODE STATUS: DNR  PPS: 50%  HOSPICE ELIGIBILITY/DIAGNOSIS: TBD  Subjective:  CHIEF COMPLAINT: falls  HISTORY OF PRESENT ILLNESS:  Heather Long is a 87 y.o. year old female  with DM, obesity, mobility, falls.  She is (I) at home with some periodic  help. She reports several falls and headaches every 1-2 days with wide spread pain, lasting several minutes.  We are asked to consult around advance care planning and complex medical decision making.    Review and summarization of old Epic records shows or history from other than patient.   History obtained from review of EMR, discussion with primary team, and  interview with family, caregiver  and/or [name] No. Records reviewed and summarized above.   CURRENT  PROBLEM LIST:  Patient Active Problem List   Diagnosis Date Noted  . Lumbar spondylosis 05/14/2019  . Lumbar facet arthropathy 05/14/2019  . SI joint arthritis 05/14/2019  . Chronic pain syndrome 04/16/2019  . Fibromyalgia 04/16/2019  . Lumbar degenerative disc disease 04/16/2019  . Lumbar facet joint syndrome 04/16/2019  . Chronic SI joint pain 04/16/2019  . Ankylosing spondylitis of lumbosacral region ([place]) 04/16/2019  . CKD (chronic kidney disease) stage 3, GFR 30-59 ml/min (HCC) 04/16/2019  . Lymphedema 09/17/2018  . Long term current use of insulin ([place]) 09/12/2018  . Rotator cuff disorder, right 04/15/2018  . Secondary hyperparathyroidism of renal origin ([place]) 10/16/2017  . Venous stasis dermatitis of left lower extremity 07/16/2017  . Chronic congestion of paranasal sinus 03/13/2017  . Degeneration macular 05/23/2016  . Osteitis deformans 05/23/2016  . Neoplasm of uncertain behavior of skin of face 05/23/2016  . Psoriasis 05/23/2016  . Vitamin B12 deficiency 05/16/2016  . Obesity, Class III, BMI 40-49.9 (morbid obesity) ([place]) 05/02/2016  . Type 2 DM with CKD stage 4 and hypertension ([place]) 05/02/2016  . Vitamin D deficiency 05/02/2016  . Lower extremity edema 05/02/2016  . Spinal stenosis of lumbar region 05/02/2016  . Essential (primary) hypertension 06/02/2012  . Hyperlipidemia associated with type 2 diabetes mellitus ([place]) 06/02/2012  . History of breast cancer 06/14/2011  . Generalized OA 06/14/2011  . Lymphedema of arm 06/14/2011  . OP (osteoporosis) 06/14/2011   PAST MEDICAL HISTORY:  Active Ambulatory Problems    Diagnosis Date Noted  . Obesity, Class III, BMI 40-49.9 (morbid obesity) ([place]) 05/02/2016  . Type 2 DM with CKD stage 4 and hypertension ([place]) 05/02/2016  . Vitamin D deficiency 05/02/2016  . Lower extremity edema 05/02/2016  . Spinal stenosis of lumbar region 05/02/2016  . Vitamin B12 deficiency 05/16/2016  . History of breast cancer 06/14/2011  .  Essential (primary) hypertension 06/02/2012  . Generalized OA 06/14/2011  . Hyperlipidemia associated with type 2 diabetes mellitus ([place]) 06/02/2012  . Lymphedema of arm 06/14/2011  . Degeneration macular 05/23/2016  . OP (osteoporosis) 06/14/2011  . Osteitis deformans 05/23/2016  . Neoplasm of uncertain behavior of skin of face 05/23/2016  . Psoriasis 05/23/2016  . Chronic congestion of paranasal sinus 03/13/2017  . Venous stasis dermatitis of left lower extremity 07/16/2017  . Secondary hyperparathyroidism of renal origin ([place]) 10/16/2017  . Rotator cuff disorder, right 04/15/2018  . Long term current use of insulin ([place]) 09/12/2018  . Lymphedema 09/17/2018  . Chronic pain syndrome 04/16/2019  . Fibromyalgia 04/16/2019  . Lumbar degenerative disc disease 04/16/2019  . Lumbar facet joint syndrome 04/16/2019  . Chronic SI joint pain 04/16/2019  . Ankylosing spondylitis of lumbosacral region ([place]) 04/16/2019  . CKD (chronic kidney disease) stage 3, GFR 30-59 ml/min (HCC) 04/16/2019  . Lumbar spondylosis 05/14/2019  . Lumbar facet arthropathy 05/14/2019  . SI joint arthritis 05/14/2019   Resolved Ambulatory Problems    Diagnosis Date Noted  . CKD (chronic kidney disease) stage 3, GFR 30-59 ml/min (HCC) 05/02/2016  .  Bilateral impacted cerumen 07/16/2017   Past Medical History:  Diagnosis Date  . Cancer ([place])   . Chronic kidney disease   . Diabetes mellitus without complication ([place])   . Gout   . Hyperlipidemia   . Hypertension   . Obesity   . Osteoporosis   . Paget's disease of bone   . Spinal stenosis    SOCIAL HX:  Social History   Tobacco Use  . Smoking status: Former Smoker    Packs/day: 0.25    Years: 2.00    Pack years: 0.50    Types: Cigarettes    Quit date: 01/02/1952    Years since quitting: 69.0  . Smokeless tobacco: Never Used  . Tobacco comment: smoking cessation materials not required  Substance Use Topics  . Alcohol use: No   FAMILY HX:  Family  History  Problem Relation Age of Onset  . Diabetes Mother   . Cancer Mother   . Heart disease Father   . Throat cancer Sister   . Heart disease Brother   . Diabetes Brother   . Cancer Sister   . Heart disease Brother   . Heart disease Brother       ALLERGIES:  Allergies  Allergen Reactions  . Ciprofloxacin Nausea And Vomiting  . Gluten Meal Other (See Comments)    GI distress  . Pioglitazone Other (See Comments)    Leg swelling  . Gabapentin Diarrhea, Nausea Only and Other (See Comments)    dizziness     PERTINENT MEDICATIONS:  Outpatient Encounter Medications as of 01/03/2021  Medication Sig  . ACCU-CHEK AVIVA PLUS test strip USE AS DIRECTED THREE TIMES DAILY  . acetaminophen (TYLENOL) 325 MG tablet Take 650 mg by mouth every 6 (six) hours as needed.  Marland Kitchen amLODipine (NORVASC) 5 MG tablet Take 2.5 mg by mouth daily.   . B-D UF III MINI PEN NEEDLES 31G X 5 MM MISC USE AS DIRECTED EVERY DAY  . Cholecalciferol (VITAMIN D3) 5000 units CAPS Take 50,000 Units by mouth daily. Daily   . furosemide (LASIX) 40 MG tablet TK 1 T PO QD  . glucose blood test strip Please dispence test strips and lancets of patient's choice.  Check sugars three times daily  . LANTUS SOLOSTAR 100 UNIT/ML Solostar Pen ADMINISTER 34 UNITS UNDER THE SKIN DAILY AT 10 PM (Patient taking differently: Inject 26 Units into the skin daily. )  . losartan (COZAAR) 50 MG tablet Take 100 mg by mouth daily.   . Multiple Vitamins-Minerals (PRESERVISION AREDS PO) Take by mouth.  . sertraline (ZOLOFT) 100 MG tablet Take 100 mg by mouth daily.    No facility-administered encounter medications on file as of 01/03/2021.    Objective: ROS  General: NAD EYES: denies vision changes, wears glasses ENMT: denies dysphagia Cardiovascular: denies chest pain Pulmonary: denies cough, denies increased SOB Abdomen: endorses good appetite,denies constipation, endorses continence of bowel GU: denies dysuria, endorses continence of  urine MSK:  endorses ROM limitations, 3 recent  falls reported Skin: denies rashes or wounds Neurological: endorses weakness, Endorses chronic  pain, denies insomnia Psych: Endorses positive mood Heme/lymph/immuno: denies bruises, abnormal bleeding  Physical Exam: Current and past weights:198 lbs Constitutional: NAD General: frail appearing, obese  EYES: anicteric sclera, lids intact, no discharge  ENMT: intact hearing,oral mucous membranes moist, dentition intact CV: , 3+ LE edema Pulmonary:  no increased work of breathing, no cough, no audible wheezes, room air Abdomen: intake 100%, no ascites GU: deferred MSK: mild sarcopenia, decreased  ROM in all extremities, no contractures of LE,  Ambulatory with walker in home. Skin: warm and dry, no rashes or wounds on visible skin Neuro: Generalized weakness,  No cognitive impairment Psych: non-anxious affect, A and O x 3 Hem/lymph/immuno: no widespread bruising   Thank you for the opportunity to participate in the care of [name] No.  The palliative care team will continue to follow. Please call our office at [phone] if we can be of additional assistance.  Jason Coop, NP , DNP, MPH, AGPCNP-BC, ACHPN   COVID-19 PATIENT SCREENING TOOL  Person answering questions: _______self____________   1.  Is the patient or any family member in the home showing any signs or symptoms regarding respiratory infection?                  Person with Symptom  ______________na___________ a. Fever/chills/headache                                                        Yes___ No__X_            b. Shortness of breath                                                            Yes___ No__X_           c. Cough/congestion                                               Yes___  No__X_          d. Muscle/Body aches/pains                                                   Yes___ No__X_         e. Gastrointestinal symptoms (diarrhea,nausea)              Yes___ No__X_         f. Sudden loss of smell or taste      Yes___ No__X_        2. Within the past 10 days, has anyone living in the home had any contact with someone with or under investigation for COVID-19?    Yes___ No__X__   Person __________________

## 2021-02-14 ENCOUNTER — Other Ambulatory Visit: Payer: Medicare Other | Admitting: Primary Care

## 2021-02-14 ENCOUNTER — Other Ambulatory Visit: Payer: Self-pay

## 2021-02-14 DIAGNOSIS — E1122 Type 2 diabetes mellitus with diabetic chronic kidney disease: Secondary | ICD-10-CM

## 2021-02-14 DIAGNOSIS — M457 Ankylosing spondylitis of lumbosacral region: Secondary | ICD-10-CM

## 2021-02-14 DIAGNOSIS — E66813 Obesity, class 3: Secondary | ICD-10-CM

## 2021-02-14 DIAGNOSIS — N183 Chronic kidney disease, stage 3 unspecified: Secondary | ICD-10-CM

## 2021-02-14 DIAGNOSIS — Z515 Encounter for palliative care: Secondary | ICD-10-CM

## 2021-02-14 NOTE — Progress Notes (Signed)
[facility] Consult Note Telephone: [phone]  Fax: [phone]    Date of encounter: 02/14/21 PATIENT NAME: Heather Long [place] [address]   [phone] (home)  DOB: [birth year] MRN: [id number] PRIMARY CARE PROVIDER:    Sallee Lange, NP,  [address] [place] [address] [phone]  REFERRING PROVIDER:   Sallee Lange, NP [address] [place],  [place] [address] [phone]  RESPONSIBLE PARTY:    Contact Information    Name Relation Home Work Ackley W Son [phone]  [phone]      I met face to face with patient and family in  home. Palliative Care was asked to follow this patient by consultation request of  Gauger, Victoriano Lain, * to address advance care planning and complex medical decision making. This is the follow up visit.    ASSESSMENT AND RECOMMENDATIONS:    1. Advance Care Planning/Goals of Care: Goals include to maximize quality of life and symptom management.  2. Symptom Management:   Skin infection: Newly reported to me. Has yeast infection under breast, (R) .  Informed DIL to get Interdry for daily use. Ketoconazole 2% bid x 4 weeks. Will recheck for improvement on next visit.  Headaches:  Has been having a lot of headaches and falls would like this assessed. I am looking into neurology referral. T/c to clinic but on lunch break. I will call back.  Falls: Has had many falls. Encouraged to use rollator. She states she often forgets to take it. Risk for fracture.  Eyesight: Has been told that she cannot drive any more. This will be very limiting for her and source of depression. She is getting injections in her R eye to try to maintain some function. May need more supportive devices, from [employer], [place], or technology such as Alexa.  3. Follow up Palliative Care Visit: Palliative care will continue to follow for  goals of care clarification and symptom management. Return 4-6 weeks or prn.   4. Family /Caregiver/Community Supports: son  And DiL care for her. Has a care worker as well 2 x / week.   5. Cognitive / Functional decline:  A and O x 3. Needs assistance with alds, all iadls.   I spent 60 minutes providing this consultation,  from 1130 to 1230.  More than 50% of the time in this consultation was spent in counseling and care coordination.  CODE STATUS: DNR  PPS: 40%  HOSPICE ELIGIBILITY/DIAGNOSIS: TBD  CHIEF COMPLAINT: skin infection  HISTORY OF PRESENT ILLNESS:  Heather Long is a 87 y.o. year old female  with DM, obesity, frequent falls, abnormal of gait, candidiasis infection of R breast.  Has had ongoing x many months of red rash under R breast. This is also painful and red. Has been using A and D ointment without help. I have sent in ketaconazole 2% for bid.   History obtained from review of EMR, discussion with primary team, and  interview with family, caregiver  and/or [name] No. Records reviewed and summarized above.   CURRENT PROBLEM LIST:  Patient Active Problem List   Diagnosis Date Noted  . Lumbar spondylosis 05/14/2019  . Lumbar facet arthropathy 05/14/2019  . SI joint arthritis 05/14/2019  . Chronic pain syndrome 04/16/2019  . Fibromyalgia 04/16/2019  . Lumbar degenerative disc disease 04/16/2019  . Lumbar facet joint syndrome 04/16/2019  . Chronic SI joint pain  04/16/2019  . Ankylosing spondylitis of lumbosacral region ([place]) 04/16/2019  . CKD (chronic kidney disease) stage 3, GFR 30-59 ml/min (HCC) 04/16/2019  . Lymphedema 09/17/2018  . Long term current use of insulin ([place]) 09/12/2018  . Rotator cuff disorder, right 04/15/2018  . Secondary hyperparathyroidism of renal origin ([place]) 10/16/2017  . Venous stasis dermatitis of left lower extremity 07/16/2017  . Chronic congestion of paranasal sinus 03/13/2017  . Degeneration macular 05/23/2016  . Osteitis  deformans 05/23/2016  . Neoplasm of uncertain behavior of skin of face 05/23/2016  . Psoriasis 05/23/2016  . Vitamin B12 deficiency 05/16/2016  . Obesity, Class III, BMI 40-49.9 (morbid obesity) ([place]) 05/02/2016  . Type 2 DM with CKD stage 4 and hypertension ([place]) 05/02/2016  . Vitamin D deficiency 05/02/2016  . Lower extremity edema 05/02/2016  . Spinal stenosis of lumbar region 05/02/2016  . Essential (primary) hypertension 06/02/2012  . Hyperlipidemia associated with type 2 diabetes mellitus ([place]) 06/02/2012  . History of breast cancer 06/14/2011  . Generalized OA 06/14/2011  . Lymphedema of arm 06/14/2011  . OP (osteoporosis) 06/14/2011   PAST MEDICAL HISTORY:  Active Ambulatory Problems    Diagnosis Date Noted  . Obesity, Class III, BMI 40-49.9 (morbid obesity) ([place]) 05/02/2016  . Type 2 DM with CKD stage 4 and hypertension ([place]) 05/02/2016  . Vitamin D deficiency 05/02/2016  . Lower extremity edema 05/02/2016  . Spinal stenosis of lumbar region 05/02/2016  . Vitamin B12 deficiency 05/16/2016  . History of breast cancer 06/14/2011  . Essential (primary) hypertension 06/02/2012  . Generalized OA 06/14/2011  . Hyperlipidemia associated with type 2 diabetes mellitus ([place]) 06/02/2012  . Lymphedema of arm 06/14/2011  . Degeneration macular 05/23/2016  . OP (osteoporosis) 06/14/2011  . Osteitis deformans 05/23/2016  . Neoplasm of uncertain behavior of skin of face 05/23/2016  . Psoriasis 05/23/2016  . Chronic congestion of paranasal sinus 03/13/2017  . Venous stasis dermatitis of left lower extremity 07/16/2017  . Secondary hyperparathyroidism of renal origin ([place]) 10/16/2017  . Rotator cuff disorder, right 04/15/2018  . Long term current use of insulin ([place]) 09/12/2018  . Lymphedema 09/17/2018  . Chronic pain syndrome 04/16/2019  . Fibromyalgia 04/16/2019  . Lumbar degenerative disc disease 04/16/2019  . Lumbar facet joint syndrome 04/16/2019  . Chronic SI joint pain  04/16/2019  . Ankylosing spondylitis of lumbosacral region ([place]) 04/16/2019  . CKD (chronic kidney disease) stage 3, GFR 30-59 ml/min (HCC) 04/16/2019  . Lumbar spondylosis 05/14/2019  . Lumbar facet arthropathy 05/14/2019  . SI joint arthritis 05/14/2019   Resolved Ambulatory Problems    Diagnosis Date Noted  . CKD (chronic kidney disease) stage 3, GFR 30-59 ml/min (HCC) 05/02/2016  . Bilateral impacted cerumen 07/16/2017   Past Medical History:  Diagnosis Date  . Cancer ([place])   . Chronic kidney disease   . Diabetes mellitus without complication ([facility])   . Gout   . Hyperlipidemia   . Hypertension   . Obesity   . Osteoporosis   . Paget's disease of bone   . Spinal stenosis    SOCIAL HX:  Social History   Tobacco Use  . Smoking status: Former Smoker    Packs/day: 0.25    Years: 2.00    Pack years: 0.50    Types: Cigarettes    Quit date: 01/02/1952    Years since quitting: 69.1  . Smokeless tobacco: Never Used  . Tobacco comment: smoking cessation materials not required  Substance Use Topics  . Alcohol use: No  FAMILY HX:  Family History  Problem Relation Age of Onset  . Diabetes Mother   . Cancer Mother   . Heart disease Father   . Throat cancer Sister   . Heart disease Brother   . Diabetes Brother   . Cancer Sister   . Heart disease Brother   . Heart disease Brother       ALLERGIES:  Allergies  Allergen Reactions  . Ciprofloxacin Nausea And Vomiting  . Gluten Meal Other (See Comments)    GI distress  . Pioglitazone Other (See Comments)    Leg swelling  . Gabapentin Diarrhea, Nausea Only and Other (See Comments)    dizziness     PERTINENT MEDICATIONS:  Outpatient Encounter Medications as of 02/14/2021  Medication Sig  . ACCU-CHEK AVIVA PLUS test strip USE AS DIRECTED THREE TIMES DAILY  . acetaminophen (TYLENOL) 325 MG tablet Take 650 mg by mouth every 6 (six) hours as needed.  Marland Kitchen amLODipine (NORVASC) 5 MG tablet Take 2.5 mg by mouth daily.   . B-D  UF III MINI PEN NEEDLES 31G X 5 MM MISC USE AS DIRECTED EVERY DAY  . Cholecalciferol (VITAMIN D3) 5000 units CAPS Take 50,000 Units by mouth daily. Daily   . furosemide (LASIX) 40 MG tablet TK 1 T PO QD  . glucose blood test strip Please dispence test strips and lancets of patient's choice.  Check sugars three times daily  . LANTUS SOLOSTAR 100 UNIT/ML Solostar Pen ADMINISTER 34 UNITS UNDER THE SKIN DAILY AT 10 PM (Patient taking differently: Inject 26 Units into the skin daily. )  . losartan (COZAAR) 50 MG tablet Take 100 mg by mouth daily.   . Multiple Vitamins-Minerals (PRESERVISION AREDS PO) Take by mouth.  . sertraline (ZOLOFT) 100 MG tablet Take 100 mg by mouth daily.    No facility-administered encounter medications on file as of 02/14/2021.   ROS  General: NAD EYES:endorses  vision changes, decreased vision L eye ENMT: denies dysphagia Cardiovascular: denies chest pain, denies DOE Pulmonary: denies cough, denies increased SOB Abdomen: endorses good appetite, denies constipation, endorses continence of bowel GU: denies dysuria, endorses continence of urine MSK:  denies weakness,  + falls reported Skin: denies rashes or wounds Neurological: endorses joint pain, denies insomnia Psych: Endorses depressed mood from increasing fx loss. Heme/lymph/immuno: denies bruises, abnormal bleeding  Physical Exam: Current and past weights: unchanged Constitutional:  NAD General: frail appearing, obese  EYES: anicteric sclera, lids intact, no discharge  ENMT: intact hearing, oral mucous membranes moist, dentition intact CV: 1+  LE edema Pulmonary:  no increased work of breathing, no cough, room air Abdomen: intake 100%, no ascites GU: deferred MSK: + sarcopenia, moves all extremities, ambulatory with walker Skin: warm and dry, no rashes or wounds on visible skin Neuro:  +  generalized weakness,  no cognitive impairment Psych: non-anxious affect, A and O x 3 Hem/lymph/immuno: no  widespread bruising   Thank you for the opportunity to participate in the care of [name] No.  The palliative care team will continue to follow. Please call our office at [phone] if we can be of additional assistance.   Jason Coop, NP , DNP, MPH, AGPCNP-BC, ACHPN   COVID-19 PATIENT SCREENING TOOL Asked and negative response unless otherwise noted:   Have you had symptoms of covid, tested positive or been in contact with someone with symptoms/positive test in the past 5-10 days?

## 2021-04-24 ENCOUNTER — Other Ambulatory Visit: Payer: Self-pay

## 2021-04-24 ENCOUNTER — Other Ambulatory Visit: Payer: Medicare Other | Admitting: Primary Care

## 2021-04-26 ENCOUNTER — Other Ambulatory Visit: Payer: Self-pay

## 2021-04-26 ENCOUNTER — Other Ambulatory Visit: Payer: Medicare Other | Admitting: Primary Care

## 2021-04-26 DIAGNOSIS — H3530 Unspecified macular degeneration: Secondary | ICD-10-CM

## 2021-04-26 DIAGNOSIS — M461 Sacroiliitis, not elsewhere classified: Secondary | ICD-10-CM

## 2021-04-26 DIAGNOSIS — M159 Polyosteoarthritis, unspecified: Secondary | ICD-10-CM

## 2021-04-26 DIAGNOSIS — E1122 Type 2 diabetes mellitus with diabetic chronic kidney disease: Secondary | ICD-10-CM

## 2021-04-26 DIAGNOSIS — N184 Chronic kidney disease, stage 4 (severe): Secondary | ICD-10-CM

## 2021-04-26 DIAGNOSIS — M47818 Spondylosis without myelopathy or radiculopathy, sacral and sacrococcygeal region: Secondary | ICD-10-CM

## 2021-04-26 NOTE — Progress Notes (Signed)
[facility] Consult Note Telephone: [phone]  Fax: [phone]    Date of encounter: 04/26/21 PATIENT NAME: Heather Long [id number]   [phone] (home)  DOB: 08-31-1928 MRN: [id number] PRIMARY CARE PROVIDER:    Sallee Lange, NP,  [address] [place] [address] [phone]  REFERRING PROVIDER:   Sallee Lange, NP [address] [place],  [place] [address] [phone]  RESPONSIBLE PARTY:    Contact Information    Name Relation Home Work [place] W Son [phone]  [phone]       I met face to face with patient  home. Palliative Care was asked to follow this patient by consultation request of  Gauger, Victoriano Lain, * to address advance care planning and complex medical decision making. This is a follow up visit.                                   ASSESSMENT AND PLAN / RECOMMENDATIONS:   Advance Care Planning/Goals of Care: Goals include to maximize quality of life and symptom management. Our advance care planning conversation included a discussion about:     The value and importance of advance care planning  Exploration of personal, cultural or spiritual beliefs that might influence medical decisions   Exploration of goals of care in the event of a sudden injury or illness   Identification and preparation of a healthcare agent   Review and  creation of an  advance directive document .  Decision not to resuscitate reviewed  CODE STATUS: left goldenrod DNR, reviewed MOST and updated  Symptom Management/Plan   Blindness: Discusses eyesight limitations, to see representatives from council for blind tomorrow.  Endorses much less ability to see to function eg cannot check her sugars and has to click insulin pen to right doses.  Recommend upgraded magnifying devices to read. She also enjoys audio books.  Caregiver needs : Has given up driving and is  considering to move in with son and DIL. This is the plan they are working on. She will then sell the house. She is very positive about this plan as she would like to have someone around. She is growing more and more isolated now that she cannot see well or drive.   Headaches: Has had headaches for about a year. Frontal and occipital areas, cannot correlate with hunger or insomnia. Sometimes has dizziness. States she gets dizzy from ibuprofen. Works well but gives her light headedness. Denies TMJ pain. Discussed it being tension related to life decisions. Recommend occ OTC analgesia. Her bp is elevated today but she has not taken her bp meds yet. Advised to take meds asap this am. She checks her BP daily.  Follow up Palliative Care Visit: Palliative care will continue to follow for complex medical decision making, advance care planning, and clarification of goals. Return 6-8 weeks or prn.  I spent 60 minutes providing this consultation. More than 50% of the time in this consultation was spent in counseling and care coordination.  PPS: 50%  HOSPICE ELIGIBILITY/DIAGNOSIS: TBD  Chief Complaint: self care deficits  HISTORY OF PRESENT ILLNESS:  Heather Long is a 87 y.o. year old female  with blindness due to macular degeneration,  immobility, self care deficits related to decreased mobility and poor eyesight. She is seen by eye center and receiving rx for macular degeneration  with some stabilizing but generally having more problems with self care.   History obtained from review of EMR, discussion with primary team, and interview with family, facility staff/caregiver and/or [name] No.  I reviewed available labs, medications, imaging, studies and related documents from the EMR.  Records reviewed and summarized above.   ROS  General: NAD EYES: endorses worsening eyesight ENMT: denies dysphagia Cardiovascular: denies chest pain, denies DOE Pulmonary: denies cough, denies increased  SOB Abdomen: endorses good appetite, denies constipation, endorses continence of bowel GU: denies dysuria, endorses continence of urine MSK: endorses weakness,  no falls reported Skin: denies rashes or wounds Neurological: endorses head and general OA pain, endorses  insomnia Psych: Endorses depressed but improving  mood Heme/lymph/immuno: denies bruises, abnormal bleeding  Physical Exam: Current and past weights: stable Constitutional: NAD General: frail appearing, obese , 192/74 HR 78 RR 18 EYES: anicteric sclera, lids intact, no discharge  ENMT: Hard of  hearing, oral mucous membranes moist, dentition intact CV: 2+ LE edema, compression in place Pulmonary:no increased work of breathing, no cough, room air Abdomen: intake 100%,  no ascites GU: deferred MSK: mod sarcopenia, moves all extremities, ambulatory with [place] Skin: warm and dry, no rashes or wounds on visible skin Neuro:  + generalized weakness,  no cognitive impairment Psych: slight anxious affect, A and O x 3 Hem/lymph/immuno: no widespread bruising   Thank you for the opportunity to participate in the care of [name] No.  The palliative care team will continue to follow. Please call our office at [phone] if we can be of additional assistance.   Jason Coop, NP , DNP, MPH, AGPCNP-BC, ACHPN  COVID-19 PATIENT SCREENING TOOL Asked and negative response unless otherwise noted:   Have you had symptoms of covid, tested positive or been in contact with someone with symptoms/positive test in the past 5-10 days?

## 2021-05-04 ENCOUNTER — Telehealth: Payer: Self-pay | Admitting: Primary Care

## 2021-05-04 NOTE — Telephone Encounter (Signed)
Patient telephone call received. Returned the call. She states that her son was distressed that she had elected the  do not resuscitate order.  She has capacity to make her own decisions. She states that he requested that she revoke it. She asked me to discontinue all advance directives in her medical chart that include DNR. I will go into epic and remove her current MOST which has do not resuscitate, and her [place] do not resuscitate orders. We can reassess her other advance directives upon our next visit.  She voiced agreement with this plan. I encouraged her to call with any questions.

## 2021-05-15 ENCOUNTER — Other Ambulatory Visit: Payer: Medicare Other | Admitting: Primary Care

## 2021-06-01 ENCOUNTER — Other Ambulatory Visit: Payer: Self-pay | Admitting: Nurse Practitioner

## 2021-06-01 DIAGNOSIS — Z87828 Personal history of other (healed) physical injury and trauma: Secondary | ICD-10-CM

## 2021-06-01 DIAGNOSIS — R296 Repeated falls: Secondary | ICD-10-CM

## 2021-06-01 DIAGNOSIS — R519 Headache, unspecified: Secondary | ICD-10-CM

## 2021-06-07 ENCOUNTER — Ambulatory Visit
Admission: RE | Admit: 2021-06-07 | Discharge: 2021-06-07 | Disposition: A | Discharge status: Home / Self Care | Payer: Medicare Other | Source: Ambulatory Visit | Attending: Nurse Practitioner | Admitting: Nurse Practitioner

## 2021-06-07 ENCOUNTER — Other Ambulatory Visit: Payer: Self-pay

## 2021-06-07 DIAGNOSIS — R519 Headache, unspecified: Secondary | ICD-10-CM | POA: Insufficient documentation

## 2021-06-07 DIAGNOSIS — Z87828 Personal history of other (healed) physical injury and trauma: Secondary | ICD-10-CM | POA: Insufficient documentation

## 2021-06-07 DIAGNOSIS — R296 Repeated falls: Secondary | ICD-10-CM | POA: Diagnosis present

## 2021-06-27 ENCOUNTER — Other Ambulatory Visit: Payer: Medicare Other | Admitting: Primary Care

## 2021-06-28 ENCOUNTER — Other Ambulatory Visit: Payer: Self-pay

## 2021-06-28 ENCOUNTER — Telehealth: Payer: Self-pay | Admitting: Primary Care

## 2021-06-28 ENCOUNTER — Other Ambulatory Visit: Payer: Medicare Other | Admitting: Primary Care

## 2021-06-28 DIAGNOSIS — N184 Chronic kidney disease, stage 4 (severe): Secondary | ICD-10-CM

## 2021-06-28 DIAGNOSIS — E1122 Type 2 diabetes mellitus with diabetic chronic kidney disease: Secondary | ICD-10-CM

## 2021-06-28 DIAGNOSIS — M159 Polyosteoarthritis, unspecified: Secondary | ICD-10-CM

## 2021-06-28 DIAGNOSIS — Z515 Encounter for palliative care: Secondary | ICD-10-CM

## 2021-06-28 DIAGNOSIS — N183 Chronic kidney disease, stage 3 unspecified: Secondary | ICD-10-CM

## 2021-06-28 NOTE — Progress Notes (Signed)
[facility] Consult Note Telephone: [phone]  Fax: [phone]    Date of encounter: 06/28/21 PATIENT NAME: Heather Long [address] [place] [address]   [phone] (home)  DOB: 1927-12-25 MRN: [id number] PRIMARY CARE PROVIDER:    Sallee Lange, NP,  [address] [place] [address] [phone]  REFERRING PROVIDER:   Sallee Lange, NP [address] [place],  [place] [address] [phone]  RESPONSIBLE PARTY:    Contact Information     Name Relation Home Work [place] W Son [phone]  [phone]       I met face to face with patient in her  home. Palliative Care was asked to follow this patient by consultation request of  Gauger, Victoriano Lain, * to address advance care planning and complex medical decision making. This is a follow up visit.                                    ASSESSMENT AND PLAN / RECOMMENDATIONS:   Advance Care Planning/Goals of Care: Goals include to maximize quality of life and symptom management. Patient agreed to discuss ACP. Our advance care planning conversation included a discussion about:     Exploration of personal, cultural or spiritual beliefs that might influence medical decisions  Exploration of goals of care in the event of a sudden injury or illness  Review and updating  of an  advance directive document . CODE STATUS: States her son wants her to have CPR. States she has ripped up DNR.   Symptom Management/Plan:   Patient is prepping to move to her son's home. She is taking a few items and going to have a yard sale for others. She is emotionally overwhelmed with moving from her home and is tearful. She appears tired/fatigued as she works on getting rid of items in her home.  We discussed her request to destroy her DNR. She states her son became very upset at that decision. She states she will defer to her son and DIL for their decisions at that  time. She was open to further discussion and will involve her family.  Medications: Would like sent to mail order pharmacies now, [facility]. To discuss with PCP about changing. She cannot get out to get her medications now that she cannot drive.  Blind resources:  Getting services from the [employer] for the blind. Has appt this week. States she has a hard time seeing small print and needs something to clip onto her table so she can check her blood sugars.   Follow up Palliative Care Visit: Palliative care will continue to follow for complex medical decision making, advance care planning, and clarification of goals. Return 6-8 weeks or prn.  I spent 40 minutes providing this consultation. More than 50% of the time in this consultation was spent in counseling and care coordination.  PPS: 50%  HOSPICE ELIGIBILITY/DIAGNOSIS: no  Chief Complaint: debility, fatigue  HISTORY OF PRESENT ILLNESS:  Heather Long is a 87 y.o. year old female  with DM, obesity, OA, chronic pain, fatigue.   History obtained from review of EMR, discussion with primary team, and interview with family, facility staff/caregiver and/or [name] No.  I reviewed available labs, medications, imaging, studies and related documents from the EMR.  Records reviewed and summarized above.   ROS   General: NAD EYES: denies vision changes  ENMT: denies dysphagia Cardiovascular: denies chest pain, denies DOE Pulmonary: denies cough, denies increased SOB Abdomen: endorses good appetite, denies constipation, endorses continence of bowel GU: denies dysuria, endorses continence of urine MSK:  endorses weakness,  no falls reported, using cane Skin: denies rashes or wounds Neurological: endorses OA pain, denies insomnia Psych: Endorses down mood Heme/lymph/immuno: denies bruises, abnormal bleeding  Physical Exam: Current and past weights:stable, has scale Constitutional: NAD General: frail appearing, obese  EYES: anicteric  sclera, lids intact, no discharge , sight declining due to macular degeneration ENMT: hard of hearing, oral mucous membranes moist, dentition intact Pulmonary:no increased work of breathing, no cough, room air Abdomen: intake 100%, no ascites GU: deferred MSK: mod sarcopenia, moves all extremities, ambulatory Skin: warm and dry, no rashes or wounds on visible skin Neuro:  mild  generalized weakness,  mild  cognitive impairment Psych: anxious affect, A and O x 3 Hem/lymph/immuno: no widespread bruising  Thank you for the opportunity to participate in the care of [name] No.  The palliative care team will continue to follow. Please call our office at [phone] if we can be of additional assistance.   Jason Coop, NP   COVID-19 PATIENT SCREENING TOOL Asked and negative response unless otherwise noted:   Have you had symptoms of covid, tested positive or been in contact with someone with symptoms/positive test in the past 5-10 days?

## 2021-06-28 NOTE — Telephone Encounter (Signed)
T/c to  make palliative care appointment. I had mistakenly thought she had moved to her son's, will make a visit today.

## 2021-11-17 ENCOUNTER — Telehealth: Payer: Self-pay | Admitting: Primary Care

## 2021-11-17 NOTE — Telephone Encounter (Signed)
T/c to son as pt number has been disconnected. Offered home palliative medicine visit. Message left with son.

## 2021-12-11 ENCOUNTER — Telehealth: Payer: Self-pay

## 2021-12-11 NOTE — Telephone Encounter (Signed)
1239 pm.  Message received from Ralene Bathe, NP to return call to patient to schedule a home visit.  Return call made to patient.  She recently moved in with her son and was calling to schedule a visit with [facility] NP.  Patient is scheduled for 01/23/22 @ 11 am.  Ralene Bathe, NP notified of appointment.   New address [place]

## 2022-01-23 ENCOUNTER — Other Ambulatory Visit: Payer: Self-pay

## 2022-01-23 ENCOUNTER — Other Ambulatory Visit: Payer: Medicare Other | Admitting: Primary Care

## 2022-01-23 DIAGNOSIS — E1122 Type 2 diabetes mellitus with diabetic chronic kidney disease: Secondary | ICD-10-CM

## 2022-01-23 DIAGNOSIS — Z515 Encounter for palliative care: Secondary | ICD-10-CM

## 2022-01-23 DIAGNOSIS — E66813 Obesity, class 3: Secondary | ICD-10-CM

## 2022-01-23 DIAGNOSIS — H3530 Unspecified macular degeneration: Secondary | ICD-10-CM

## 2022-01-23 DIAGNOSIS — N184 Chronic kidney disease, stage 4 (severe): Secondary | ICD-10-CM

## 2022-01-23 DIAGNOSIS — M461 Sacroiliitis, not elsewhere classified: Secondary | ICD-10-CM

## 2022-01-23 DIAGNOSIS — M47818 Spondylosis without myelopathy or radiculopathy, sacral and sacrococcygeal region: Secondary | ICD-10-CM

## 2022-01-23 NOTE — Progress Notes (Addendum)
? ? ?[facility] Consult Note ?Telephone: [phone]  ?Fax: [phone]  ? ? ?Date of encounter: 01/23/22 ?11:57 AM ?PATIENT NAME: Heather Long ?[place], [place] [address] ? ?DOB: 07/16/1928 ? ?MRN: [id number] ?PRIMARY CARE PROVIDER:    ?Sallee Lange, NP,  ?[address] ?[place] [address] ?[phone] ? ?REFERRING PROVIDER:   ?Sallee Lange, NP ?[address],  [place] [address] ?[phone] ? ?RESPONSIBLE PARTY:    ?Contact Information   ? ? Name Relation Home Work Mobile  ? [place], Ste[place]ann Son [phone]  [phone]  ? ?  ? ? ?I met face to face with patient in  home. Family was at home but did not join [place]. Palliative Care was asked to follow this patient by consultation request of  Gauger, Victoriano Lain, * to address advance care planning and complex medical decision making. This is a follow up visit. ? ?                                 ASSESSMENT AND PLAN / RECOMMENDATIONS:  ? ?Advance Care Planning/Goals of Care: Goals include to maximize quality of life and symptom management. Patient/health care surrogate gave his/her permission to discuss.Our advance care planning conversation included a discussion about:    ?The value and importance of advance care planning  ?Exploration of personal, cultural or spiritual beliefs that might influence medical decisions  ?Exploration of goals of care in the event of a sudden injury or illness  ?Identification of a healthcare agent - son Jeanne Ivan ?CODE STATUS:FULL ?Son had called to cancel her DNR, and she agreed with his request  ? ?Symptom Management/Plan: ?I met with patient in her home she is now living with her son and daughter-in-law after selling her home.  ? ?Caregiver strain: She outlines that they had considered assisted-living which she was interested in. She is concerned about some finances and states that she doesn't have access to her proceeds of her house sale. We  discussed her preferring to look at [employer] whereby she can participate in a social community as well as have readily available transportation. She does endorse [place].  ? ?She endorses being interested in assisted living or a small apartment. I will consult with social worker regarding options given her income that she reports and available housing options as desired. ? ?Macular degeneration she uses magnifying glasses and has in the past receive services for the blind. She continues to struggle with site and is dependent and I ADLs such as banking and other business tape paperwork.  ? ?Diabetes endorses a vegan diet now that she is at her son's house. She states she was told to check her blood sugar just quarterly with her A-1 C so she no longer checks her blood sugars daily.  She endorses being on [place] herb preparations and having stopped other of her medication's but is unclear on what she is now taking. ? ?Mobility she is able to ambulate in the home with a cane. She endorses some feelings of instability in the bathroom. She has a shower bench but no grab bars are available at her new residence.  She would benefit from more accommodations for immobility such as installed grab bars and perhaps a tub bench that straddled the tub for easier maneuvering into the bath.  ? ?Mobility: She is currently going to  the  [facility] [employer] bus one day a week; I encouraged her to perhaps go for two if she enjoys it. She goes for some exercise. I asked her to inquire if they had other activities and maybe a meal that she could stay for. ? ?I spent 60 minutes providing this consultation. More than 50% of the time in this consultation was spent in counseling and care coordination. ? ?Follow up Palliative Care Visit: Palliative care will continue to follow for complex medical decision making, advance care planning, and clarification of goals. Return 4 weeks or  prn. ? ?PPS: 50% ? ?HOSPICE ELIGIBILITY/DIAGNOSIS: no ? ?Chief Complaint: deficits in self care, depression ? ?HISTORY OF PRESENT ILLNESS:  Heather Long is a 88 y.o. year old female  with blindness due to MD, DM, obesity, abnormality of gait. Seen today for PC follow up and assessment of medical management. ? ?History obtained from review of EMR, discussion with primary team, and interview with family, facility staff/caregiver and/or [name] No.  ?I reviewed available labs, medications, imaging, studies and related documents from the EMR.  Records reviewed and summarized above.  ? ?ROS ? ? ?General: NAD ?EYES: denies vision changes, low vision, being Rx for MD ?ENMT: denies dysphagia ?Cardiovascular: denies chest pain, denies DOE ?Pulmonary: denies cough, denies increased SOB ?Abdomen: endorses good appetite, denies constipation, endorses continence of bowel ?GU: denies dysuria, endorses continence of urine ?MSK:  denies  increased weakness,  no falls reported ?Skin: denies rashes or wounds ?Neurological: denies pain, denies insomnia ?Psych: Endorses depressed  mood ?Heme/lymph/immuno: denies bruises, abnormal bleeding ? ?Physical Exam: ?Current and past weights: unavailable ?Constitutional: NAD ?General: frail appearing, obese  ?EYES: anicteric sclera, lids intact, no discharge  ?ENMT: hearing, loss, uses aids, oral mucous membranes moist, dentition intact ?CV: S1S2, RRR, 1+ bil  LE edema ?Pulmonary: LCTA, no increased work of breathing, no cough, room air ?Abdomen: intake 80%, normo-active BS + 4 quadrants, soft and non tender, no ascites ?GU: deferred ?MSK: mild  sarcopenia, moves all extremities, ambulatory with cane ?Skin: warm and dry, no rashes or wounds on visible skin ?Neuro:  no  new  generalized weakness,  no cognitive impairment ?Psych: anxious affect, tearful, A and O x 3 ?Hem/lymph/immuno: no widespread bruising ? ?Thank you for the opportunity to participate in the care of [name] No.  The  palliative care team will continue to follow. Please call our office at [phone] if we can be of additional assistance.  ? ?Jason Coop, NP DNP, AGPCNP-BC ? ?COVID-19 PATIENT SCREENING TOOL ?Asked and negative response unless otherwise noted:  ? ?Have you had symptoms of covid, tested positive or been in contact with someone with symptoms/positive test in the past 5-10 days?  ? ?

## 2022-01-24 ENCOUNTER — Telehealth: Payer: Self-pay | Admitting: Primary Care

## 2022-01-24 NOTE — Telephone Encounter (Signed)
Several emails this AM from patient DIL outlining strain of pt living in her and pt's son's home. Patient and son and DIL are not getting along per her description and she wanted input from me RE ALF. Email returned outlining details of ALF vs SNF care, requirement of TB testing for either, and offer for FL2 if needed. Encouraged them to consult our [facility] for clarifying any questions surrounding caregiver strain or placement. Contact information given for SW consultation. ?

## 2022-01-26 ENCOUNTER — Encounter (INDEPENDENT_AMBULATORY_CARE_PROVIDER_SITE_OTHER): Payer: Self-pay | Admitting: Vascular Surgery

## 2022-01-26 ENCOUNTER — Ambulatory Visit (INDEPENDENT_AMBULATORY_CARE_PROVIDER_SITE_OTHER): Payer: Medicare Other | Admitting: Vascular Surgery

## 2022-01-26 ENCOUNTER — Other Ambulatory Visit: Payer: Self-pay

## 2022-01-26 VITALS — BP 173/93 | HR 97 | Resp 16 | Wt 174.0 lb

## 2022-01-26 DIAGNOSIS — N184 Chronic kidney disease, stage 4 (severe): Secondary | ICD-10-CM

## 2022-01-26 DIAGNOSIS — I129 Hypertensive chronic kidney disease with stage 1 through stage 4 chronic kidney disease, or unspecified chronic kidney disease: Secondary | ICD-10-CM

## 2022-01-26 DIAGNOSIS — E1122 Type 2 diabetes mellitus with diabetic chronic kidney disease: Secondary | ICD-10-CM | POA: Diagnosis not present

## 2022-01-26 DIAGNOSIS — N183 Chronic kidney disease, stage 3 unspecified: Secondary | ICD-10-CM

## 2022-01-26 DIAGNOSIS — I10 Essential (primary) hypertension: Secondary | ICD-10-CM

## 2022-01-26 DIAGNOSIS — I890 Lymphedema, not elsewhere classified: Secondary | ICD-10-CM | POA: Diagnosis not present

## 2022-01-26 DIAGNOSIS — M7989 Other specified soft tissue disorders: Secondary | ICD-10-CM | POA: Insufficient documentation

## 2022-01-26 NOTE — Assessment & Plan Note (Signed)
blood glucose control important in reducing the progression of atherosclerotic disease. Also, involved in wound healing. On appropriate medications.  

## 2022-01-26 NOTE — Assessment & Plan Note (Signed)
Definitely a contributor to her LE swelling. ?

## 2022-01-26 NOTE — Progress Notes (Signed)
Patient ID: Heather Long, female   DOB: 02-Feb-1928, 88 y.o.   MRN: [id number]  Chief Complaint  Patient presents with   New Patient (Initial Visit)    Ref Luana Shu lymphedema    HPI Heather Long is a 88 y.o. female.  I am asked to see the patient by Dr. Luana Shu for evaluation of prominent lower extremity swelling bilaterally.  She had been seen in our office almost 4 years ago for swelling and pain in her legs.  She had Unna boots briefly, and ultimately did not have much change in her therapies.  Since that time, her legs have continued to swell.  Her mobility and activity have diminished over time given her advancing age and her poor vision.  She walks with a walker now.  She has chronic kidney disease and multiple other medical issues as well.  Both legs are prominently swollen and heavy.  The left may be a little worse than the right.  No open wounds or ulceration.  No weeping of the tissue.  No fevers or chills.  She previously had ABIs which showed no significant arterial insufficiency in years past.  She has worn compression socks until recently and these did help control the symptoms somewhat.   Past Medical History:  Diagnosis Date   Cancer ([place])    Chronic kidney disease    Diabetes mellitus without complication (HCC)    Gout    Hyperlipidemia    Hypertension    Lymphedema    Obesity    Osteoporosis    Paget's disease of bone    Spinal stenosis     Past Surgical History:  Procedure Laterality Date   ABDOMINAL HYSTERECTOMY     JOINT REPLACEMENT  2000, 2006   MASTECTOMY Left 2010   ORIF WRIST FRACTURE  07/2016     Family History  Problem Relation Age of Onset   Diabetes Mother    Cancer Mother    Heart disease Father    Throat cancer Sister    Heart disease Brother    Diabetes Brother    Cancer Sister    Heart disease Brother    Heart disease Brother       Social History   Tobacco Use   Smoking status: Former    Packs/day: 0.25    Years: 2.00     Pack years: 0.50    Types: Cigarettes    Quit date: 01/02/1952    Years since quitting: 70.1   Smokeless tobacco: Never   Tobacco comments:    smoking cessation materials not required  Vaping Use   Vaping Use: Never used  Substance Use Topics   Alcohol use: No   Drug use: No     Allergies  Allergen Reactions   Ciprofloxacin Nausea And Vomiting   Gluten Meal Other (See Comments)    GI distress   Pioglitazone Other (See Comments)    Leg swelling   Gabapentin Diarrhea, Nausea Only and Other (See Comments)    dizziness    Current Outpatient Medications  Medication Sig Dispense Refill   ACCU-CHEK AVIVA PLUS test strip USE AS DIRECTED THREE TIMES DAILY 100 each 3   acetaminophen (TYLENOL) 325 MG tablet Take 650 mg by mouth every 6 (six) hours as needed.     B-D UF III MINI PEN NEEDLES 31G X 5 MM MISC USE AS DIRECTED EVERY DAY 100 each 0   Cholecalciferol (VITAMIN D3) 5000 units CAPS Take 50,000 Units by mouth daily. Daily  furosemide (LASIX) 40 MG tablet TK 1 T PO QD  6   glucose blood test strip Please dispence test strips and lancets of patient's choice.  Check sugars three times daily     LANTUS SOLOSTAR 100 UNIT/ML Solostar Pen ADMINISTER 34 UNITS UNDER THE SKIN DAILY AT 10 PM (Patient taking differently: Inject 10 Units into the skin daily.) 15 mL 12   losartan (COZAAR) 50 MG tablet Take 100 mg by mouth daily.      Multiple Vitamins-Minerals (PRESERVISION AREDS PO) Take by mouth.     amLODipine (NORVASC) 5 MG tablet Take 2.5 mg by mouth daily.  (Patient not taking: Reported on 01/26/2022)     sertraline (ZOLOFT) 100 MG tablet Take 100 mg by mouth daily.  (Patient not taking: Reported on 01/26/2022)     No current facility-administered medications for this visit.      REVIEW OF SYSTEMS (Negative unless checked)  Constitutional: '[]'$ Weight loss  '[]'$ Fever  '[]'$ Chills Cardiac: '[]'$ Chest pain   '[]'$ Chest pressure   '[]'$ Palpitations   '[]'$ Shortness of breath when laying flat    '[]'$ Shortness of breath at rest   '[]'$ Shortness of breath with exertion. Vascular:  '[x]'$ Pain in legs with walking   '[x]'$ Pain in legs at rest   '[]'$ Pain in legs when laying flat   '[]'$ Claudication   '[]'$ Pain in feet when walking  '[]'$ Pain in feet at rest  '[]'$ Pain in feet when laying flat   '[]'$ History of DVT   '[]'$ Phlebitis   '[x]'$ Swelling in legs   '[]'$ Varicose veins   '[]'$ Non-healing ulcers Pulmonary:   '[]'$ Uses home oxygen   '[]'$ Productive cough   '[]'$ Hemoptysis   '[]'$ Wheeze  '[]'$ COPD   '[]'$ Asthma Neurologic:  '[]'$ Dizziness  '[]'$ Blackouts   '[]'$ Seizures   '[]'$ History of stroke   '[]'$ History of TIA  '[]'$ Aphasia   '[]'$ Temporary blindness   '[]'$ Dysphagia   '[]'$ Weakness or numbness in arms   '[]'$ Weakness or numbness in legs Musculoskeletal:  '[x]'$ Arthritis   '[]'$ Joint swelling   '[x]'$ Joint pain   '[]'$ Low back pain Hematologic:  '[]'$ Easy bruising  '[]'$ Easy bleeding   '[]'$ Hypercoagulable state   '[]'$ Anemic  '[]'$ Hepatitis Gastrointestinal:  '[]'$ Blood in stool   '[]'$ Vomiting blood  '[]'$ Gastroesophageal reflux/heartburn   '[]'$ Abdominal pain Genitourinary:  '[x]'$ Chronic kidney disease   '[]'$ Difficult urination  '[]'$ Frequent urination  '[]'$ Burning with urination   '[]'$ Hematuria Skin:  '[]'$ Rashes   '[]'$ Ulcers   '[]'$ Wounds Psychological:  '[]'$ History of anxiety   '[]'$  History of major depression.    Physical Exam BP (!) 173/93 (BP Location: Left Arm)    Pulse 97    Resp 16    Wt 174 lb (78.9 kg)    BMI 35.14 kg/m  Gen:  WD/WN, NAD.  Appears younger than stated age Head: [place]/AT, No temporalis wasting. Ear/Nose/Throat: Hearing diminished, nares w/o erythema or drainage, oropharynx w/o Erythema/Exudate Eyes: Conjunctiva clear, sclera non-icteric.  Visual acuity diminished Neck: trachea midline.  No JVD.  Pulmonary:  Good air movement, respirations not labored, no use of accessory muscles  Cardiac: RRR, no JVD Vascular:  Vessel Right Left  Radial Palpable Palpable                          DP 1+ trace  PT NP NP   Gastrointestinal:. No masses, surgical incisions, or scars. Musculoskeletal: M/S 5/5  throughout.  Extremities without ischemic changes.  No deformity or atrophy.  2-3+ bilateral lower extremity edema.  Walks with a walker Neurologic: Sensation grossly intact in extremities.  Symmetrical.  Speech is fluent.  Motor exam as listed above. Psychiatric: Judgment intact, Mood & affect appropriate for pt's clinical situation. Dermatologic: No rashes or ulcers noted.  No cellulitis or open wounds.    Radiology No results found.  Labs No results found for this or any previous visit (from the past 2160 hour(s)).  Assessment/Plan:  Swelling of limb I have had a long discussion with the patient regarding swelling and why it  causes symptoms.  Patient will begin wearing graduated compression stockings class 1 (20-30 mmHg) on a daily basis a prescription was given. The patient will  beginning wearing the stockings first thing in the morning and removing them in the evening. The patient is instructed specifically not to sleep in the stockings.   In addition, behavioral modification will be initiated.  This will include frequent elevation, use of over the counter pain medications and exercise such as walking.  I have reviewed systemic causes for chronic edema such as liver, kidney and cardiac etiologies.  The patient denies problems with these organ systems.    Consideration for a lymph pump will also be made based upon the effectiveness of conservative therapy.  This would help to improve the edema control and prevent sequela such as ulcers and infections   Patient should undergo duplex ultrasound of the venous system to ensure that DVT or reflux is not present.  The patient will follow-up with me after the ultrasound.    Lymphedema The patient has stage II possibly even stage III lymphedema at this point with swelling refractory to compression and elevation.  She has not had a venous reflux study and I think that is prudent to assess this as well in the near future at her convenience.   A lymphedema pump would be an excellent adjuvant therapy in addition to her resuming compression stockings and elevating her legs is much as possible.  With her advanced chronic kidney disease, she may need more diuretic therapy as well.  CKD (chronic kidney disease) stage 3, GFR 30-59 ml/min (HCC) Definitely a contributor to her LE swelling.  Type 2 DM with CKD stage 4 and hypertension (HCC) blood glucose control important in reducing the progression of atherosclerotic disease. Also, involved in wound healing. On appropriate medications.   Essential (primary) hypertension blood pressure control important in reducing the progression of atherosclerotic disease. On appropriate oral medications.      Leotis Pain 01/26/2022, 2:22 PM   This note was created with Dragon medical transcription system.  Any errors from dictation are unintentional.

## 2022-01-26 NOTE — Assessment & Plan Note (Signed)
blood pressure control important in reducing the progression of atherosclerotic disease. On appropriate oral medications.  

## 2022-01-26 NOTE — Assessment & Plan Note (Signed)

## 2022-01-26 NOTE — Assessment & Plan Note (Signed)
The patient has stage II possibly even stage III lymphedema at this point with swelling refractory to compression and elevation.  She has not had a venous reflux study and I think that is prudent to assess this as well in the near future at her convenience.  A lymphedema pump would be an excellent adjuvant therapy in addition to her resuming compression stockings and elevating her legs is much as possible.  With her advanced chronic kidney disease, she may need more diuretic therapy as well. ?

## 2022-02-16 ENCOUNTER — Ambulatory Visit (INDEPENDENT_AMBULATORY_CARE_PROVIDER_SITE_OTHER): Payer: Medicare Other

## 2022-02-16 DIAGNOSIS — M7989 Other specified soft tissue disorders: Secondary | ICD-10-CM

## 2022-02-16 DIAGNOSIS — I890 Lymphedema, not elsewhere classified: Secondary | ICD-10-CM

## 2022-02-28 ENCOUNTER — Other Ambulatory Visit: Payer: Medicare Other | Admitting: Primary Care

## 2022-03-02 ENCOUNTER — Non-Acute Institutional Stay: Payer: Medicare Other | Admitting: Student

## 2022-03-02 DIAGNOSIS — F4321 Adjustment disorder with depressed mood: Secondary | ICD-10-CM

## 2022-03-02 DIAGNOSIS — R52 Pain, unspecified: Secondary | ICD-10-CM

## 2022-03-02 DIAGNOSIS — Z515 Encounter for palliative care: Secondary | ICD-10-CM

## 2022-03-02 NOTE — Progress Notes (Signed)
? ? ?Manufacturing engineer ?Community Palliative Care Consult Note ?Telephone: [phone]  ?Fax: [phone]  ? ? ?Date of encounter: 03/02/22 ? ?PATIENT NAME: Heather Long ?[address] ?[place] [address]   ?[phone] (home)  ?DOB: 1928/06/27 ?MRN: [id number] ?PRIMARY CARE PROVIDER:    ?Sallee Lange, NP,  ?[address] ?[place] [address] ?[phone] ? ?REFERRING PROVIDER:   ?Sallee Lange, NP ?[address],  [place] [address] ?[phone] ? ?RESPONSIBLE PARTY:    ?Contact Information   ? ? Name Relation Home Work Mobile  ? Andie, Mungin Son [phone]  [phone]  ? ?  ? ? ? ?I met face to face with patient in the facility. Palliative Care was asked to follow this patient by consultation request of  Gauger, Victoriano Lain, * to address advance care planning and complex medical decision making. This is a follow up visit. ? ?                                 ASSESSMENT AND PLAN / RECOMMENDATIONS:  ? ?Advance Care Planning/Goals of Care: Goals include to maximize quality of life and symptom management. Patient/health care surrogate gave his/her permission to discuss. ?Our advance care planning conversation included a discussion about:    ?The value and importance of advance care planning  ?Experiences with loved ones who have been seriously ill or have died  ?Exploration of personal, cultural or spiritual beliefs that might influence medical decisions  ?Exploration of goals of care in the event of a sudden injury or illness  ?CODE STATUS: Full Code ? ?Education on Palliative Medicine; will continue to provide support and symptom management.  ? ?Symptom Management/Plan: ? ?Pain-patient endorses having back pain and arthritis to joints. She is taking ibuprofen PRN. Offered alternatives; she declines at this time. ? ?Adjustment to facility-patient states she is doing okay, becomes tearful at times. She is encouraged to continue going to dining room for meals,  participate in activities of her interest. Will monitor for changes in mood/behaviors.  ? ?Follow up Palliative Care Visit: Palliative care will continue to follow for complex medical decision making, advance care planning, and clarification of goals. Return in 1-2 weeks or prn. ? ?I spent 20 minutes providing this consultation. More than 50% of the time in this consultation was spent in counseling and care coordination. ? ? ?PPS: 50% ? ?HOSPICE ELIGIBILITY/DIAGNOSIS: TBD ? ?Chief Complaint: Palliative Medicine follow up visit.  ? ?HISTORY OF PRESENT ILLNESS:  Heather Long is a 88 y.o. year old female  with macular degeneration, Type 2 diabetes, CKD 4, depression, hypertension, hyperlipidemia, OA, lumbar degenerative disc disease, LE edema, lymphedema of arm, hx of breast cancer.   ? ?Patient recently moved to [facility] from her son's home. She states she is doing okay, although having some episodes of being tearful. She expresses sadness of moving from her home to her son's. She states her appetite is improving; goes to dining room for meals. She does endorse chronic back pain, arthritis; takes ibuprofen PRN. Uses assistive device for ambulation. Denies shortness of breath, nausea, constipation. Endorses LE edema. A 10-point ROS is negative, except for the pertinent positives and negative detailed per the HPI.  ? ?History obtained from review of EMR, discussion with primary team, and interview with family, facility staff/caregiver and/or [name] No.  ?I reviewed available labs, medications, imaging, studies and related documents from the EMR.  Records reviewed and summarized above.  ? ?Physical Exam: ?Constitutional: NAD ?General: frail appearing ?EYES: anicteric sclera, lids intact, no discharge  ?ENMT: intact hearing, oral mucous membranes moist, dentition intact ?CV: S1S2, RRR, 1+ LE edema ?Pulmonary: LCTA, no increased work of breathing, no cough, room air ?Abdomen:  normo-active BS + 4  quadrants, soft and non tender, no ascites ?GU: deferred ?MSK: no sarcopenia, moves all extremities, ambulatory ?Skin: warm and dry, no rashes or wounds on visible skin ?Neuro:  generalized weakness,  no cognitive impairment ?Psych: non-anxious affect, A and O x 3, pleasant ?Hem/lymph/immuno: no widespread bruising ? ? ?Thank you for the opportunity to participate in the care of [name] No.  The palliative care team will continue to follow. Please call our office at [phone] if we can be of additional assistance.  ? ?Ezekiel Slocumb, NP  ? ?COVID-19 PATIENT SCREENING TOOL ?Asked and negative response unless otherwise noted:  ? ?Have you had symptoms of covid, tested positive or been in contact with someone with symptoms/positive test in the past 5-10 days? No ? ?

## 2022-03-08 ENCOUNTER — Non-Acute Institutional Stay: Payer: Medicare Other | Admitting: Student

## 2022-03-08 DIAGNOSIS — R609 Edema, unspecified: Secondary | ICD-10-CM

## 2022-03-08 DIAGNOSIS — F339 Major depressive disorder, recurrent, unspecified: Secondary | ICD-10-CM

## 2022-03-08 DIAGNOSIS — F4321 Adjustment disorder with depressed mood: Secondary | ICD-10-CM

## 2022-03-08 DIAGNOSIS — N1832 Chronic kidney disease, stage 3b: Secondary | ICD-10-CM

## 2022-03-08 DIAGNOSIS — R531 Weakness: Secondary | ICD-10-CM

## 2022-03-08 DIAGNOSIS — Z515 Encounter for palliative care: Secondary | ICD-10-CM

## 2022-03-08 NOTE — Progress Notes (Signed)
? ? ?Manufacturing engineer ?Community Palliative Care Consult Note ?Telephone: [phone]  ?Fax: [phone]  ? ? ?Date of encounter: 03/08/22 ?1:16 PM ?PATIENT NAME: Heather Long ?[address] ?[place] [address]   ?[phone] (home)  ?DOB: Jan 22, 1928 ?MRN: [id number] ?PRIMARY CARE PROVIDER:    ?Sallee Lange, NP,  ?[address] ?[place] [address] ?[phone] ? ?REFERRING PROVIDER:   ?Sallee Lange, NP ?[address],  [place] [address] ?[phone] ? ?RESPONSIBLE PARTY:    ?Contact Information   ? ? Name Relation Home Work Mobile  ? Cambrey, Lupi Son [phone]  [phone]  ? ?  ? ? ? ?I met face to face with patient in the facility. Palliative Care was asked to follow this patient by consultation request of  Gauger, Victoriano Lain, * to address advance care planning and complex medical decision making. This is a follow up visit. ? ?                                 ASSESSMENT AND PLAN / RECOMMENDATIONS:  ? ?Advance Care Planning/Goals of Care: Goals include to maximize quality of life and symptom management. Patient/health care surrogate gave his/her permission to discuss. ?Our advance care planning conversation included a discussion about:    ?The value and importance of advance care planning  ?Experiences with loved ones who have been seriously ill or have died  ?Exploration of personal, cultural or spiritual beliefs that might influence medical decisions  ?Exploration of goals of care in the event of a sudden injury or illness  ?Son Jeanne Ivan is HCPOA ?CODE STATUS: Full Code ? ?Symptom Management/Plan: ? ?Adjustment to facility-patient transitioned to [facility] facility recently. She is going to dining room for meals, participating in some activities. Encourage books on tape, music due to her impaired vision.  ?  ?Depression-patient with increased tearfulness. Patient was on Zoloft previously, states it made her feel 'fuzzy, dizzy." We discussed trying an alternative.  She is to continue working with therapy. Encourage patient to participate in activities of interest. She is to see PCP tomorrow and to discuss restarting an antidepressant. ? ?Weakness-patient endorses weakness. She is encouraged to use walker for ambulation. She denies need for therapy at this time; will monitor for worsening weakness.  ? ?Back pain, arthritis- patient declines to take any additional medications at this time for pain. Will monitor for worsening symptoms. Patient states she prefers to take as little medication as possible. Discussed topical pain medications.  ? ?LE edema-currently taking furosemide 20 mg every other day.  ? ?CKD- last GFR on file 33. Follow up with nephrology as scheduled.  ? ?Patient expresses wanting to speak with SW regarding finances, HCPOA, obtaining an ID. Will refer to palliative SW.  ? ?Follow up Palliative Care Visit: Palliative care will continue to follow for complex medical decision making, advance care planning, and clarification of goals. Return in 2-4 weeks or prn. ? ?This visit was coded based on medical decision making (MDM). ? ?PPS: 50% ? ?HOSPICE ELIGIBILITY/DIAGNOSIS: TBD ? ?Chief Complaint: Palliative Medicine follow up visit. ? ?HISTORY OF PRESENT ILLNESS:  Heather Long is a 88 y.o. year old female  with depression, macular degeneration, Type 2 diabetes, CKD 4, hypertension, hyperlipidemia, OA, lumbar degenerative disc disease, LE edema, lymphedema of arm, hx of breast cancer.  ? ?Patient started therapy today. She has been feeling more depressed; expresses multiple losses such as her  home, finances, transitioning to new facility. She has been going to dining room for meals, participating in activities. She does endorse her vision limiting her activities. Has peripheral vision in right eye. Left eye legally blind, reports dx  in the past year. Appetite has improved. No longer checking blood sugars. Denies recent episodes of hypo or hyperglycemia. Reports  weakness; no falls or injury; uses walker for ambulation. Endorses back pain, bilateral hand pain. Takes ibuprofen PRN. A 10-point ROS is negative, except for the pertinent positives and negatives detailed per the HPI.  ? ?History obtained from review of EMR, discussion with primary team, and interview with family, facility staff/caregiver and/or [name] No.  ?I reviewed available labs, medications, imaging, studies and related documents from the EMR.  Records reviewed and summarized above.  ? ?Physical Exam: ?Constitutional: NAD ?General: frail appearing ?EYES: anicteric sclera, lids intact, no discharge  ?ENMT: intact hearing with hearing aids, oral mucous membranes moist, dentition intact ?CV: S1S2, RRR, 1-2+ LE edema ?Pulmonary: LCTA, no increased work of breathing, no cough, room air ?Abdomen:  normo-active BS + 4 quadrants, soft and non tender, no ascites ?GU: deferred ?MSK: no sarcopenia, moves all extremities, ambulatory ?Skin: warm and dry, no rashes or wounds on visible skin ?Neuro:  no generalized weakness,  no cognitive impairment ?Psych: non-anxious affect, A and O x 3 ?Hem/lymph/immuno: no widespread bruising ? ? ?Thank you for the opportunity to participate in the care of [name] No.  The palliative care team will continue to follow. Please call our office at [phone] if we can be of additional assistance.  ? ?Ezekiel Slocumb, NP  ? ?COVID-19 PATIENT SCREENING TOOL ?Asked and negative response unless otherwise noted:  ? ?Have you had symptoms of covid, tested positive or been in contact with someone with symptoms/positive test in the past 5-10 days? No ? ?

## 2022-03-15 ENCOUNTER — Telehealth: Payer: Self-pay

## 2022-03-15 NOTE — Telephone Encounter (Signed)
PC SW attempted to outreach patient, per [facility] NP - L. Rivers SW referral request. ? ?Call unsuccessful. SW unable to connect with patient or LVM.  ?

## 2022-03-16 ENCOUNTER — Non-Acute Institutional Stay: Payer: Medicare Other | Admitting: Student

## 2022-03-16 DIAGNOSIS — Z515 Encounter for palliative care: Secondary | ICD-10-CM

## 2022-03-16 DIAGNOSIS — F339 Major depressive disorder, recurrent, unspecified: Secondary | ICD-10-CM

## 2022-03-16 DIAGNOSIS — F4321 Adjustment disorder with depressed mood: Secondary | ICD-10-CM

## 2022-03-18 NOTE — Progress Notes (Signed)
? ? ?Manufacturing engineer ?Community Palliative Care Consult Note ?Telephone: [phone]  ?Fax: [phone]  ? ? ?Date of encounter: 03/16/2022 ? ?PATIENT NAME: Heather Long ?[address] ?[place] [address]   ?[phone] (home)  ?DOB: [birth year] ?MRN: [id number] ?PRIMARY CARE PROVIDER:    ?Heather Lange, NP,  ?[address] ?[place] [address] ?[phone] ? ?REFERRING PROVIDER:   ?Heather Lange, NP ?[address],  [place] [address] ?[phone] ? ?RESPONSIBLE PARTY:    ?Contact Information   ? ? Name Relation Home Work Mobile  ? Long, Carollo Son [phone]  [phone]  ? ?  ? ? ? ?I met face to face with patient in the facility. Palliative Care was asked to follow this patient by consultation request of  Gauger, Heather Long, * to address advance care planning and complex medical decision making. This is a follow up visit. ? ?                                 ASSESSMENT AND PLAN / RECOMMENDATIONS:  ? ?Advance Care Planning/Goals of Care: Goals include to maximize quality of life and symptom management. Patient/health care surrogate gave his/her permission to discuss. ? ?CODE STATUS: Full Code ? ?Symptom Management/Plan: ? ?Depression-patient has new order for escitalopram 5 mg daily per PCP.  She has expressed some hesitation about starting medication. Today she states that she will begin medication after discussion.  Ongoing support from SW counseling services. Palliative SW also to join on next visit.  ? ?Adjustment to facility-patient transitioned to AL facility recently. She is engaging with other residents. She is going to dining room for meals, participating in some activities. Encourage books on tape, music due to her impaired vision.  ?  ? ?Follow up Palliative Care Visit: Palliative care will continue to follow for complex medical decision making, advance care planning, and clarification of goals. Return 2 weeks or prn. ? ? ?This visit was coded  based on medical decision making (MDM). ? ?PPS: 50% ? ?HOSPICE ELIGIBILITY/DIAGNOSIS: TBD ? ?Chief Complaint: Palliative Medicine follow up visit.  ? ?HISTORY OF PRESENT ILLNESS:  Heather Long is a 88 y.o. year old female  with  depression, macular degeneration, Type 2 diabetes, CKD 4, hypertension, hyperlipidemia, OA, lumbar degenerative disc disease, LE edema, lymphedema of arm, hx of breast cancer.  ? ?Patient resides at [facility] assisted living facility.  She states that she was seen by PCP but has yet to start new antidepressant medication.  She was started on glipizide 2.5 mg daily; A1c of 6.5.  She has been checking blood sugars daily ranging from 115-180 mg/dL.  She denies any concerns today.  Patient is tearful at times.  She is appreciative of counseling services and support. A 10-point ROS is negative, except for the pertinent positives and negatives detailed per the HPI.  ? ?History obtained from review of EMR, discussion with primary team, and interview with family, facility staff/caregiver and/or Heather Long.  ?I reviewed available labs, medications, imaging, studies and related documents from the EMR.  Records reviewed and summarized above.  ? ?Physical Exam: ? ?Constitutional: NAD ?General: frail appearing ?EYES: anicteric sclera, lids intact, Long discharge  ?ENMT: intact hearing, oral mucous membranes moist, dentition intact ?CV: S1S2, RRR, trace LE edema ?Pulmonary: LCTA, Long increased work of breathing, Long cough, room air ?Abdomen: normo-active BS + 4 quadrants, soft and non tender, Long ascites ?  GU: deferred ?MSK: Long sarcopenia, moves all extremities, ambulatory ?Skin: warm and dry, Long rashes or wounds on visible skin ?Neuro: generalized weakness,  Long cognitive impairment ?Psych: non-anxious affect, A and O x 3 ?Hem/lymph/immuno: Long widespread bruising ? ? ?Thank you for the opportunity to participate in the care of Heather Long.  The palliative care team will continue to follow.  Please call our office at [phone] if we can be of additional assistance.  ? ?Ezekiel Slocumb, NP  ? ?COVID-19 PATIENT SCREENING TOOL ?Asked and negative response unless otherwise noted:  ? ?Have you had symptoms of covid, tested positive or been in contact with someone with symptoms/positive test in the past 5-10 days? Long ? ?

## 2022-03-22 ENCOUNTER — Non-Acute Institutional Stay: Payer: Medicare Other | Admitting: Student

## 2022-03-22 DIAGNOSIS — K5900 Constipation, unspecified: Secondary | ICD-10-CM

## 2022-03-22 DIAGNOSIS — Z515 Encounter for palliative care: Secondary | ICD-10-CM

## 2022-03-22 DIAGNOSIS — R52 Pain, unspecified: Secondary | ICD-10-CM

## 2022-03-22 DIAGNOSIS — F339 Major depressive disorder, recurrent, unspecified: Secondary | ICD-10-CM

## 2022-03-22 DIAGNOSIS — R600 Localized edema: Secondary | ICD-10-CM

## 2022-03-22 NOTE — Progress Notes (Signed)
? ? ?Manufacturing engineer ?Community Palliative Care Consult Note ?Telephone: [phone]  ?Fax: [phone]  ? ? ?Date of encounter: 03/22/22 ?9:09 AM ?PATIENT NAME: Heather Long ?[address] ?[place] [address]   ?[phone] (home)  ?DOB: [birth year] ?MRN: [id number] ?PRIMARY CARE PROVIDER:    ?Sallee Lange, NP,  ?[address] ?[place] [address] ?[phone] ? ?REFERRING PROVIDER:   ?Sallee Lange, NP ?[address],  [place] [address] ?[phone] ? ?RESPONSIBLE PARTY:    ?Contact Information   ? ? Name Relation Home Work Mobile  ? Amore, Ackman Son [phone]  [phone]  ? ?  ? ? ? ?I met face to face with patient in the facility. Palliative Care was asked to follow this patient by consultation request of  Gauger, Victoriano Lain, * to address advance care planning and complex medical decision making. This is a follow up visit. ? ?                                 ASSESSMENT AND PLAN / RECOMMENDATIONS:  ? ?Advance Care Planning/Goals of Care: Goals include to maximize quality of life and symptom management. Patient/health care surrogate gave his/her permission to discuss. ? ?CODE STATUS: Full Code ? ?Patient's counselor Malachy Mood is present today. Patient expresses wanting to regain some independence. She also expresses wanting to move to a [facility] if possible. Will refer to palliative SW. ? ?Symptom Management/Plan: ? ?Depression-patient tried Lexapro x 1 dose. She reported feeling dizzy and stopped medication. She will continue counseling services, which she feels is helpful.  ? ?Constipation-reports occasional constipation; request stool softener. Will start colace 100 mg daily PRN. ? ?Pain-patient reports back pain. She will take PRN ibuprofen. She also expresses pain to low toilet and her mattress is uncomfortable. Left message for patient's son to see if he is agreeable to NP ordering raised toilet seat and to make him aware of mattress.   ? ?LE  edema-patient with chronic LE edema. She is to follow up with lymphedema clinic for leg pumps. Continue to wear compression socks or stockings, elevate legs, continue furosemide daily as directed.  ? ?Follow up Palliative Care Visit: Palliative care will continue to follow for complex medical decision making, advance care planning, and clarification of goals. Return in 4-6 weeks or prn. ? ? ?This visit was coded based on medical decision making (MDM). ? ?PPS: 50% ? ?HOSPICE ELIGIBILITY/DIAGNOSIS: TBD ? ?Chief Complaint: Palliative Medicine follow up visit.  ? ?HISTORY OF PRESENT ILLNESS:  Heather Long is a 88 y.o. year old female  with depression, macular degeneration, Type 2 diabetes, CKD 4, hypertension, hyperlipidemia, OA, lumbar degenerative disc disease, LE edema, lymphedema of arm, hx of breast cancer.   ? ?Patient is tearful at times during visit today. She states she did try antidepressant one time and felt dizzy, so she stopped medication. She reports back pain, states her bed is uncomfortable. Takes ibuprofen PRN. No falls; uses rollator for ambulation. Denies weakness. She endorses good appetite. She endorses LE edema; she has been wearing compression socks, taking furosemide daily. A 10-point ROS is negative, except for the pertinent positives and negatives detailed per the HPI.  ? ? ?History obtained from review of EMR, discussion with primary team, and interview with family, facility staff/caregiver and/or [name] No.  ?I reviewed available labs, medications, imaging, studies and related documents from the EMR.  Records reviewed and summarized  above.  ? ? ?Physical Exam: ?Pulse 88, resp 16, sats 97% on room air ?Constitutional: NAD ?General: frail appearing ?EYES: anicteric sclera, lids intact, no discharge, impaired vision ?ENMT: intact hearing, oral mucous membranes moist, dentition intact ?CV: S1S2, RRR, 1+ LE edema ?Pulmonary: LCTA, no increased work of breathing, no cough, room  air ?Abdomen: normo-active BS + 4 quadrants, soft and non tender, no ascites ?GU: deferred ?MSK: moves all extremities, ambulatory ?Skin: warm and dry, no rashes or wounds on visible skin ?Neuro:  no generalized weakness,  no cognitive impairment ?Psych: non-anxious affect, A and O x 3 ?Hem/lymph/immuno: no widespread bruising ? ? ?Thank you for the opportunity to participate in the care of [name] No.  The palliative care team will continue to follow. Please call our office at [phone] if we can be of additional assistance.  ? ?Ezekiel Slocumb, NP  ? ?COVID-19 PATIENT SCREENING TOOL ?Asked and negative response unless otherwise noted:  ? ?Have you had symptoms of covid, tested positive or been in contact with someone with symptoms/positive test in the past 5-10 days? No ? ?

## 2022-03-23 ENCOUNTER — Telehealth: Payer: Self-pay | Admitting: Student

## 2022-03-23 NOTE — Telephone Encounter (Signed)
Received return call from daughter in law Butch Penny. She states they received message regarding raised toilet seat and mattress. She has ordered raised toilet seat and it should arrive in next couple of days. She states they have tried multiple mattresses for patient. She is encouraged to call should any questions arise.  ?

## 2022-03-28 ENCOUNTER — Telehealth: Payer: Self-pay

## 2022-03-28 NOTE — Telephone Encounter (Signed)
PC SW attempted reach patient x3, per [facility] NP - L. Rivers request to follow up with patient. ? ?Calls were unsuccessful. SW unable to connect with patient due to each call dropping. Will attempt to outreach again at later time.  ?

## 2022-04-27 ENCOUNTER — Ambulatory Visit (INDEPENDENT_AMBULATORY_CARE_PROVIDER_SITE_OTHER): Payer: Medicare Other | Admitting: Vascular Surgery

## 2022-04-27 ENCOUNTER — Encounter (INDEPENDENT_AMBULATORY_CARE_PROVIDER_SITE_OTHER): Payer: Medicare Other

## 2022-05-18 ENCOUNTER — Encounter (INDEPENDENT_AMBULATORY_CARE_PROVIDER_SITE_OTHER): Payer: Medicare Other

## 2022-05-18 ENCOUNTER — Ambulatory Visit (INDEPENDENT_AMBULATORY_CARE_PROVIDER_SITE_OTHER): Payer: Medicare Other | Admitting: Vascular Surgery

## 2022-05-28 ENCOUNTER — Ambulatory Visit (INDEPENDENT_AMBULATORY_CARE_PROVIDER_SITE_OTHER): Payer: Medicare Other | Admitting: Nurse Practitioner

## 2022-05-28 ENCOUNTER — Encounter (INDEPENDENT_AMBULATORY_CARE_PROVIDER_SITE_OTHER): Payer: Self-pay | Admitting: Nurse Practitioner

## 2022-05-28 VITALS — BP 141/75 | HR 75 | Resp 16 | Wt 177.4 lb

## 2022-05-28 DIAGNOSIS — I10 Essential (primary) hypertension: Secondary | ICD-10-CM

## 2022-05-28 DIAGNOSIS — I890 Lymphedema, not elsewhere classified: Secondary | ICD-10-CM | POA: Diagnosis not present

## 2022-05-30 ENCOUNTER — Non-Acute Institutional Stay: Payer: Medicare Other | Admitting: Student

## 2022-05-30 DIAGNOSIS — R52 Pain, unspecified: Secondary | ICD-10-CM

## 2022-05-30 DIAGNOSIS — F339 Major depressive disorder, recurrent, unspecified: Secondary | ICD-10-CM

## 2022-05-30 DIAGNOSIS — R600 Localized edema: Secondary | ICD-10-CM

## 2022-05-30 DIAGNOSIS — Z515 Encounter for palliative care: Secondary | ICD-10-CM

## 2022-05-30 NOTE — Progress Notes (Signed)
[facility]er, [facility] Palliative Care Consult Note Telephone: [phone]  Fax: [phone]    Date of encounter: 05/30/22  PATIENT NAME: Heather Long [id number]   [phone] (home)  DOB: 1927-12-26 MRN: [id number] PRIMARY CARE PROVIDER:    Sallee Lange, NP,  [address] [place] [address] [phone]  REFERRING PROVIDER:   Sallee Lange, NP [address] [place],  [place] [address] [phone]  RESPONSIBLE PARTY:    Contact Information     Name Relation Home Work [place] Son [phone]  [phone]        I met face to face with patient in the facility. Palliative Care was asked to follow this patient by consultation request of  Gauger, Victoriano Lain, * to address advance care planning and complex medical decision making. This is a follow up visit.                                   ASSESSMENT AND PLAN / RECOMMENDATIONS:   Advance Care Planning/Goals of Care: Goals include to maximize quality of life and symptom management. Patient/health care surrogate gave his/her permission to discuss.  CODE STATUS: Full Code  Education provided on palliative medicine.  Patient still expresses wanting to move to a more independent living situation.  She is still seeing counselor weekly.  Palliative medicine will continue to provide supportive care, symptom management as needed.  Symptom Management/Plan:  Depression-patient declines any medicine interventions at this time.  Due to stating how they have made her feel in the past. She feels weekly counseling has been effective; recommend continuing weekly counseling services. She does appear to have adjusted to facility.  She still expresses some strain over family dynamics.  LE edema-patient with chronic lower extremity edema.  Patient seen by vascular in past couple of days.  She is to be measured for compression device. Patient  encouraged to wear compression stockings and elevate legs we discussed continuing diuretics as well.  Pain-endorses back and shoulder pain. Recommend continuing ibuprofen PRN.     Follow up Palliative Care Visit: Palliative care will continue to follow for complex medical decision making, advance care planning, and clarification of goals. Return in 8 weeks or prn.   This visit was coded based on medical decision making (MDM).  PPS: 50%  HOSPICE ELIGIBILITY/DIAGNOSIS: TBD  Chief Complaint: Palliative Medicine follow up visit.   HISTORY OF PRESENT ILLNESS:  Heather Long is a 88 y.o. year old female  with depression, macular degeneration, Type 2 diabetes, CKD 4, hypertension, hyperlipidemia, OA, lumbar degenerative disc disease, LE edema, lymphedema of arm, hx of breast cancer.      Patient resides at [facility] assisted living.  She states that she has adjusted well although she would like to move to an independent living apartment.  She also expresses strain with family. She is continuing with weekly counseling services, which she feels have been effective. She endorses occasional back and shoulder pain.  Patient with chronic lower extremity edema.  She endorses good appetite.  No recent infections, no recent falls or injury.A 10-point ROS is negative, except for the pertinent positives and negatives detailed per the HPI.   History obtained from review of EMR, discussion with primary team, and interview with family, facility staff/caregiver and/or [name] No.  I reviewed available labs, medications, imaging, studies and related documents from the  EMR.  Records reviewed and summarized above.    Physical Exam: Pulse 68, respirations 16, sats 98% on room air. Constitutional: NAD General: frail appearing EYES: anicteric sclera, lids intact, no discharge  ENMT: intact hearing, oral mucous membranes moist, dentition intact CV: S1S2, RRR,  BLE edema Pulmonary: LCTA, no increased work  of breathing, no cough, room air Abdomen: normo-active BS + 4 quadrants, soft and non tender GU: deferred MSK:  moves all extremities, ambulatory Skin: warm and dry, no rashes or wounds on visible skin Neuro:  generalized weakness Psych: Labile mood, A and O x 3 Hem/lymph/immuno: no widespread bruising   Thank you for the opportunity to participate in the care of [name] No.  The palliative care team will continue to follow. Please call our office at [phone] if we can be of additional assistance.   Ezekiel Slocumb, NP   COVID-19 PATIENT SCREENING TOOL Asked and negative response unless otherwise noted:   Have you had symptoms of covid, tested positive or been in contact with someone with symptoms/positive test in the past 5-10 days? No

## 2022-06-10 ENCOUNTER — Encounter (INDEPENDENT_AMBULATORY_CARE_PROVIDER_SITE_OTHER): Payer: Self-pay | Admitting: Nurse Practitioner

## 2022-06-10 NOTE — Progress Notes (Addendum)
Subjective:    Patient ID: Heather Long, female    DOB: [birth year], 88 y.o.   MRN: [id number] Chief Complaint  Patient presents with   Follow-up    Follow up    Heather Long is a 88 y.o. female.  I am asked to see the patient by Dr. Luana Shu for evaluation of prominent lower extremity swelling bilaterally.  She had been seen in our office almost 4 years ago for swelling and pain in her legs.  She had Unna boots briefly, and ultimately did not have much change in her therapies.  Since that time, her legs have continued to swell.  Her mobility and activity have diminished over time given her advancing age and her poor vision.  She walks with a walker now.  She has chronic kidney disease and multiple other medical issues as well.  Both legs are prominently swollen and heavy.  The left may be a little worse than the right.  No open wounds or ulceration.  No weeping of the tissue.  No fevers or chills.  She previously had ABIs which showed no significant arterial insufficiency in years past.  She has worn compression socks until recently and these did help control the symptoms somewhat.    Review of Systems  Cardiovascular:  Positive for leg swelling.  All other systems reviewed and are negative.      Objective:   Physical Exam Vitals reviewed.  HENT:     Head: Normocephalic.  Cardiovascular:     Rate and Rhythm: Normal rate.  Pulmonary:     Effort: Pulmonary effort is normal.  Musculoskeletal:     Right lower leg: Edema present.     Left lower leg: Edema present.  Skin:    General: Skin is warm and dry.     Comments: Bilateral dermal thickening with some stasis dermatitis  Neurological:     Mental Status: She is alert and oriented to person, place, and time.     Gait: Gait abnormal.  Psychiatric:        Mood and Affect: Mood normal.        Behavior: Behavior normal.        Thought Content: Thought content normal.        Judgment: Judgment normal.     BP (!) 141/75  (BP Location: Left Arm)   Pulse 75   Resp 16   Wt 177 lb 6.4 oz (80.5 kg)   BMI 35.83 kg/m   Past Medical History:  Diagnosis Date   Cancer ([place])    Chronic kidney disease    Diabetes mellitus without complication (HCC)    Gout    Hyperlipidemia    Hypertension    Lymphedema    Obesity    Osteoporosis    Paget's disease of bone    Spinal stenosis     Social History   Socioeconomic History   Marital status: Divorced    Spouse name: Not on file   Number of children: 2   Years of education: Not on file   Highest education level: 12th grade  Occupational History   Occupation: Retired  Tobacco Use   Smoking status: Former    Packs/day: 0.25    Years: 2.00    Total pack years: 0.50    Types: Cigarettes    Quit date: 01/02/1952    Years since quitting: 70.4   Smokeless tobacco: Never   Tobacco comments:    smoking cessation materials not required  Vaping Use  Vaping Use: Never used  Substance and Sexual Activity   Alcohol use: No   Drug use: No   Sexual activity: Not Currently  Other Topics Concern   Not on file  Social History Narrative   Not on file   Social Determinants of Health   Financial Resource Strain: Low Risk  (12/09/2017)   Overall Financial Resource Strain (CARDIA)    Difficulty of Paying Living Expenses: Not hard at all  Food Insecurity: No Food Insecurity (12/09/2017)   Hunger Vital Sign    Worried About Running Out of Food in the Last Year: Never true    Ran Out of Food in the Last Year: Never true  Transportation Needs: No Transportation Needs (12/09/2017)   PRAPARE - Hydrologist (Medical): No    Lack of Transportation (Non-Medical): No  Physical Activity: Inactive (12/09/2017)   Exercise Vital Sign    Days of Exercise per Week: 0 days    Minutes of Exercise per Session: 0 min  Stress: No Stress Concern Present (12/09/2017)   [place]     Feeling of Stress : Only a little  Social Connections: Unknown (12/09/2017)   Social Connection and Isolation Panel [NHANES]    Frequency of Communication with Friends and Family: Patient refused    Frequency of Social Gatherings with Friends and Family: Patient refused    Attends Religious Services: Patient refused    Active Member of Clubs or Organizations: Patient refused    Attends Archivist Meetings: Patient refused    Marital Status: Divorced  Human resources officer Violence: Not At Risk (12/09/2017)   Humiliation, Afraid, Rape, and Kick questionnaire    Fear of Current or Ex-Partner: No    Emotionally Abused: No    Physically Abused: No    Sexually Abused: No    Past Surgical History:  Procedure Laterality Date   ABDOMINAL HYSTERECTOMY     JOINT REPLACEMENT  2000, 2006   MASTECTOMY Left 2010   ORIF WRIST FRACTURE  07/2016    Family History  Problem Relation Age of Onset   Diabetes Mother    Cancer Mother    Heart disease Father    Throat cancer Sister    Heart disease Brother    Diabetes Brother    Cancer Sister    Heart disease Brother    Heart disease Brother     Allergies  Allergen Reactions   Ciprofloxacin Nausea And Vomiting   Gluten Meal Other (See Comments)    GI distress   Pioglitazone Other (See Comments)    Leg swelling   Gabapentin Diarrhea, Nausea Only and Other (See Comments)    dizziness       Latest Ref Rng & Units 08/18/2018   10:49 AM 12/17/2016    3:32 PM 05/02/2016    4:27 PM  CBC  WBC 3.4 - 10.8 x10E3/uL 5.0  5.4  6.0   Hemoglobin 11.1 - 15.9 g/dL 12.7  12.9  12.1   Hematocrit 34.0 - 46.6 % 38.3  39.3  36.6   Platelets 150 - 450 x10E3/uL 173  171  168       CMP     Component Value Date/Time   NA 142 08/18/2018 1049   NA 142 03/14/2015 1539   K 5.0 08/18/2018 1049   K 4.2 03/14/2015 1539   CL 107 (H) 08/18/2018 1049   CL 110 03/14/2015 1539   CO2 19 (L) 08/18/2018  1049   CO2 26 03/14/2015 1539   GLUCOSE 158 (H)  08/18/2018 1049   GLUCOSE 115 (H) 03/14/2015 1539   BUN 26 08/18/2018 1049   BUN 23 (H) 03/14/2015 1539   CREATININE 1.26 (H) 08/18/2018 1049   CREATININE 1.30 (H) 03/14/2015 1539   CALCIUM 9.5 08/18/2018 1049   CALCIUM 9.3 03/14/2015 1539   PROT 6.0 04/15/2018 0954   PROT 7.0 03/14/2015 1539   ALBUMIN 4.0 04/15/2018 0954   ALBUMIN 4.1 03/14/2015 1539   AST 16 04/15/2018 0954   AST 17 03/14/2015 1539   ALT 12 04/15/2018 0954   ALT 12 (L) 03/14/2015 1539   ALKPHOS 61 04/15/2018 0954   ALKPHOS 60 03/14/2015 1539   BILITOT 0.3 04/15/2018 0954   BILITOT 0.3 03/14/2015 1539   GFRNONAA 38 (L) 08/18/2018 1049   GFRNONAA 37 (L) 03/14/2015 1539   GFRAA 44 (L) 08/18/2018 1049   GFRAA 43 (L) 03/14/2015 1539     No results found.     Assessment & Plan:   1. Lymphedema Recommend:  No surgery or intervention at this point in time.    I have reviewed my discussion with the patient regarding lymphedema and why it  causes symptoms.  Patient will continue wearing graduated compression on a daily basis. The patient should put the compression on first thing in the morning and removing them in the evening. The patient should not sleep in the compression.   In addition, behavioral modification throughout the day will be continued.  This will include frequent elevation (such as in a recliner), use of over the counter pain medications as needed and exercise such as walking.  The systemic causes for chronic edema such as liver, kidney and cardiac etiologies do not appear to have significant changed over the past year.    Despite conservative treatments for at least 4 weeks, including graduated compression therapy class 1 and behavioral modification including exercise and elevation, the patient  has not obtained adequate control of the lymphedema.  The patient still has stage 3 lymphedema and therefore, I believe that a lymph pump should be added to improve the control of the patient's lymphedema.   Additionally, a lymph pump is warranted because it will reduce the risk of cellulitis and ulceration in the future.  Patient should follow-up in six months    2. Essential (primary) hypertension The patient has some concerns and would like a referral to cardiology.  She notes that it has been several years since she seen a cardiologist.  Patient requesting referral to [facility] clinic cardiology. - Ambulatory referral to Cardiology     Current Outpatient Medications on File Prior to Visit  Medication Sig Dispense Refill   ACCU-CHEK AVIVA PLUS test strip USE AS DIRECTED THREE TIMES DAILY 100 each 3   acetaminophen (TYLENOL) 325 MG tablet Take 650 mg by mouth every 6 (six) hours as needed.     bismuth subsalicylate (PEPTO BISMOL) 262 MG chewable tablet Chew 524 mg by mouth as needed.     calcium carbonate (TUMS - DOSED IN MG ELEMENTAL CALCIUM) 500 MG chewable tablet Chew 1 tablet by mouth daily.     Cholecalciferol (VITAMIN D3) 5000 units CAPS Take 50,000 Units by mouth daily. Daily      dicyclomine (BENTYL) 10 MG capsule Take 10 mg by mouth every 12 (twelve) hours as needed for spasms.     docusate sodium (COLACE) 100 MG capsule Take 100 mg by mouth daily as needed for mild constipation.  escitalopram (LEXAPRO) 5 MG tablet Take 5 mg by mouth daily.     glipiZIDE (GLUCOTROL XL) 2.5 MG 24 hr tablet Take 2.5 mg by mouth daily with breakfast.     ibuprofen (ADVIL) 200 MG tablet Take 200 mg by mouth every 6 (six) hours as needed.     losartan (COZAAR) 100 MG tablet Take 100 mg by mouth daily.      Multiple Vitamins-Minerals (PRESERVISION AREDS PO) Take by mouth.     pantoprazole (PROTONIX) 40 MG tablet Take 40 mg by mouth daily.     vitamin B-12 (CYANOCOBALAMIN) 500 MCG tablet Take 1,500 mcg by mouth daily.     amLODipine (NORVASC) 5 MG tablet Take 2.5 mg by mouth daily.  (Patient not taking: Reported on 01/26/2022)     B-D UF III MINI PEN NEEDLES 31G X 5 MM MISC USE AS DIRECTED EVERY DAY  (Patient not taking: Reported on 05/28/2022) 100 each 0   furosemide (LASIX) 40 MG tablet TK 1 T PO QD (Patient not taking: Reported on 05/28/2022)  6   glucose blood test strip Please dispence test strips and lancets of patient's choice.  Check sugars three times daily (Patient not taking: Reported on 05/28/2022)     LANTUS SOLOSTAR 100 UNIT/ML Solostar Pen ADMINISTER 34 UNITS UNDER THE SKIN DAILY AT 10 PM (Patient not taking: Reported on 05/28/2022) 15 mL 12   sertraline (ZOLOFT) 100 MG tablet Take 100 mg by mouth daily.  (Patient not taking: Reported on 01/26/2022)     No current facility-administered medications on file prior to visit.    There are no Patient Instructions on file for this visit. No follow-ups on file.   Kris Hartmann, NP

## 2022-07-26 ENCOUNTER — Telehealth: Payer: Self-pay

## 2022-07-26 NOTE — Telephone Encounter (Signed)
PC SW Achilles Dunk, Therapist, sports at [facility] , per her request to discuss alternate placement for patient.   Call unsuccessful. SW left message with front desk receptionist.

## 2022-08-28 ENCOUNTER — Other Ambulatory Visit (INDEPENDENT_AMBULATORY_CARE_PROVIDER_SITE_OTHER): Payer: Self-pay | Admitting: Nurse Practitioner

## 2022-08-28 DIAGNOSIS — M7989 Other specified soft tissue disorders: Secondary | ICD-10-CM

## 2022-08-28 DIAGNOSIS — I890 Lymphedema, not elsewhere classified: Secondary | ICD-10-CM

## 2022-08-29 ENCOUNTER — Ambulatory Visit (INDEPENDENT_AMBULATORY_CARE_PROVIDER_SITE_OTHER): Payer: Medicare Other

## 2022-08-29 ENCOUNTER — Non-Acute Institutional Stay: Payer: Medicare Other | Admitting: Student

## 2022-08-29 DIAGNOSIS — F339 Major depressive disorder, recurrent, unspecified: Secondary | ICD-10-CM

## 2022-08-29 DIAGNOSIS — M7989 Other specified soft tissue disorders: Secondary | ICD-10-CM | POA: Diagnosis not present

## 2022-08-29 DIAGNOSIS — I890 Lymphedema, not elsewhere classified: Secondary | ICD-10-CM

## 2022-08-29 DIAGNOSIS — R600 Localized edema: Secondary | ICD-10-CM

## 2022-08-29 DIAGNOSIS — Z515 Encounter for palliative care: Secondary | ICD-10-CM

## 2022-08-30 NOTE — Progress Notes (Signed)
[facility] Consult Note Telephone: [phone]  Fax: [phone]    Date of encounter: 08/29/22  PATIENT NAME: Heather Long [place] [address]   [phone] (home)  DOB: 03/26/1928 MRN: [id number] PRIMARY CARE PROVIDER:    Sallee Lange, NP,  [address] [place] [address] [phone]  REFERRING PROVIDER:   Sallee Lange, NP [address] [place],  [place] [address] [phone]  RESPONSIBLE PARTY:    Contact Information     Name Relation Home Work Rives W Son [phone]  [phone]        I met face to face with patient in the facility. Palliative Care was asked to follow this patient by consultation request of  Gauger, Victoriano Lain, * to address advance care planning and complex medical decision making. This is a follow up visit.                                   ASSESSMENT AND PLAN / RECOMMENDATIONS:   Advance Care Planning/Goals of Care: Goals include to maximize quality of life and symptom management. Patient/health care surrogate gave his/her permission to discuss. Our advance care planning conversation included a discussion about:    The value and importance of advance care planning  Experiences with loved ones who have been seriously ill or have died  Exploration of personal, cultural or spiritual beliefs that might influence medical decisions  Exploration of goals of care in the event of a sudden injury or illness  CODE STATUS: Full Code   Education provided on Palliative Medicine. Patient is currently seeking an apartment as she expresses wanting to be more independent.   Symptom Management/Plan:  Depression-patient is encouraged to continue taking Lexapro. She continues to meet with counselor. She is currently seeking an apartment due to wanting to be more independent. She expresses strain over family dynamics. She is less tearful today.  Emotional support provided.   BLE edema-patient has f/u appointment with Vascular today. She has been wearing compression socks and elevating her legs; some improvement in edema.   Follow up Palliative Care Visit: Palliative care will continue to follow for complex medical decision making, advance care planning, and clarification of goals. Return n 6-8 weeks or prn.  I spent 40 minutes providing this consultation. More than 50% of the time in this consultation was spent in counseling and care coordination.   PPS: 50%  HOSPICE ELIGIBILITY/DIAGNOSIS: TBD  Chief Complaint: Palliative Medicine follow up visit.   HISTORY OF PRESENT ILLNESS:  Pamelia Botto is a 88 y.o. year old female  with depression, macular degeneration, Type 2 diabetes, CKD 4, hypertension, hyperlipidemia, OA, lumbar degenerative disc disease, LE edema, lymphedema of arm, hx of breast cancer.      Patient resides at [facility] assisted [facility]. She has been currently seeking an apartment. She states the rent is going up at [facility] and would not be able to afford to stay after December. She states the staff has been good to her, but she prefers to be living independently. She also expresses ongoing strain with her son and DIL; she states she received a letter from a lawyer from them. She endorses shoulder pain, arthritis. She received cortisone injection to left shoulder in August; she is also taking ibuprofen PRN. She endorses good appetite. She is having occasional loose stools; taking imodium PRN. She states  meeting with counselor Malachy Mood has been helpful with dealing with past traumas. She has Lexapro ordered for her depression.   History obtained from review of EMR, discussion with primary team, and interview with family, facility staff/caregiver and/or [name] No.  I reviewed available labs, medications, imaging, studies and related documents from the EMR.  Records reviewed and summarized above.   ROS  A 10-point ROS  is negative, except for the pertinent positives and negatives detailed per the HPI.   Physical Exam: Pulse 68, resp 16, sats 95% on room air Weight: 182.8 pounds Constitutional: NAD General: frail appearing EYES: anicteric sclera, lids intact, no discharge  ENMT: intact hearing, oral mucous membranes moist, dentition intact CV: S1S2, RRR, 1+ LE edema Pulmonary: LCTA, no increased work of breathing, no cough, room air Abdomen: normo-active BS + 4 quadrants, soft and non tender GU: deferred FVC:BSWHQ all extremities, ambulatory Skin: warm and dry, no rashes or wounds on visible skin Neuro:  + generalized weakness,  no cognitive impairment Psych: non-anxious affect, A and O x 3 Hem/lymph/immuno: no widespread bruising   Thank you for the opportunity to participate in the care of [name] No.  The palliative care team will continue to follow. Please call our office at [phone] if we can be of additional assistance.   Ezekiel Slocumb, NP   COVID-19 PATIENT SCREENING TOOL Asked and negative response unless otherwise noted:   Have you had symptoms of covid, tested positive or been in contact with someone with symptoms/positive test in the past 5-10 days? No

## 2022-09-04 ENCOUNTER — Encounter (INDEPENDENT_AMBULATORY_CARE_PROVIDER_SITE_OTHER): Payer: Self-pay | Admitting: Vascular Surgery

## 2022-09-04 ENCOUNTER — Ambulatory Visit (INDEPENDENT_AMBULATORY_CARE_PROVIDER_SITE_OTHER): Payer: Medicare Other | Admitting: Vascular Surgery

## 2022-09-04 VITALS — BP 182/84 | HR 67 | Resp 18 | Ht 59.0 in | Wt 187.8 lb

## 2022-09-04 DIAGNOSIS — N183 Chronic kidney disease, stage 3 unspecified: Secondary | ICD-10-CM | POA: Diagnosis not present

## 2022-09-04 DIAGNOSIS — I890 Lymphedema, not elsewhere classified: Secondary | ICD-10-CM

## 2022-09-04 DIAGNOSIS — N184 Chronic kidney disease, stage 4 (severe): Secondary | ICD-10-CM

## 2022-09-04 DIAGNOSIS — E1122 Type 2 diabetes mellitus with diabetic chronic kidney disease: Secondary | ICD-10-CM | POA: Diagnosis not present

## 2022-09-04 DIAGNOSIS — I10 Essential (primary) hypertension: Secondary | ICD-10-CM

## 2022-09-04 DIAGNOSIS — M7989 Other specified soft tissue disorders: Secondary | ICD-10-CM

## 2022-09-04 DIAGNOSIS — I129 Hypertensive chronic kidney disease with stage 1 through stage 4 chronic kidney disease, or unspecified chronic kidney disease: Secondary | ICD-10-CM

## 2022-09-04 NOTE — Assessment & Plan Note (Signed)
Swelling is a little worse recently.  She asks about a diuretic which I will defer to her primary care provider who she sees next week.  She does have significant underlying chronic kidney disease but if felt safe, diuretic may be helpful therapy to remove some of the fluid.  She will continue to wear her compression socks and elevate her legs.  She will continue to be as active as possible.  We will see her back in a few months to check on her swelling.

## 2022-09-04 NOTE — Progress Notes (Signed)
MRN : [id number]  Heather Long is a 88 y.o. (12-09-1927) female who presents with chief complaint of No chief complaint on file. Marland Kitchen  History of Present Illness: Patient returns today in follow up of her leg swelling and lymphedema.  Her swelling has been a little worse recently.  Both legs are affected.  She has a litany of medical issues and significant chronic kidney disease.  She is scheduled to see her primary care provider next week.  No open wounds, weeping tissue, or signs of infection.  The legs are heavy and achy because of the swelling.  She does have a lot of arthritis she says too.   Current Outpatient Medications  Medication Sig Dispense Refill   bismuth subsalicylate (PEPTO BISMOL) 262 MG chewable tablet Chew 524 mg by mouth as needed.     Cholecalciferol (VITAMIN D3) 5000 units CAPS Take 50,000 Units by mouth daily. Daily      dicyclomine (BENTYL) 10 MG capsule Take 10 mg by mouth every 12 (twelve) hours as needed for spasms.     escitalopram (LEXAPRO) 5 MG tablet Take 5 mg by mouth daily.     glipiZIDE (GLUCOTROL XL) 2.5 MG 24 hr tablet Take 2.5 mg by mouth daily with breakfast.     ibuprofen (ADVIL) 200 MG tablet Take 200 mg by mouth every 6 (six) hours as needed.     losartan (COZAAR) 100 MG tablet Take 100 mg by mouth daily.      Multiple Vitamins-Minerals (PRESERVISION AREDS PO) Take by mouth.     pantoprazole (PROTONIX) 40 MG tablet Take 40 mg by mouth daily.     vitamin B-12 (CYANOCOBALAMIN) 500 MCG tablet Take 1,500 mcg by mouth daily.     No current facility-administered medications for this visit.    Past Medical History:  Diagnosis Date   Cancer ([place])    Chronic kidney disease    Diabetes mellitus without complication ([place])    Gout    Hyperlipidemia    Hypertension    Lymphedema    Obesity    Osteoporosis    Paget's disease of bone    Spinal stenosis     Past Surgical History:  Procedure Laterality Date   ABDOMINAL HYSTERECTOMY     JOINT  REPLACEMENT  2000, 2006   MASTECTOMY Left 2010   ORIF WRIST FRACTURE  07/2016     Social History   Tobacco Use   Smoking status: Former    Packs/day: 0.25    Years: 2.00    Total pack years: 0.50    Types: Cigarettes    Quit date: 01/02/1952    Years since quitting: 70.7   Smokeless tobacco: Never   Tobacco comments:    smoking cessation materials not required  Vaping Use   Vaping Use: Never used  Substance Use Topics   Alcohol use: No   Drug use: No       Family History  Problem Relation Age of Onset   Diabetes Mother    Cancer Mother    Heart disease Father    Throat cancer Sister    Heart disease Brother    Diabetes Brother    Cancer Sister    Heart disease Brother    Heart disease Brother      Allergies  Allergen Reactions   Ciprofloxacin Nausea And Vomiting   Gluten Meal Other (See Comments)    GI distress   Pioglitazone Other (See Comments)    Leg swelling   Gabapentin  Diarrhea, Nausea Only and Other (See Comments)    dizziness     REVIEW OF SYSTEMS (Negative unless checked)   Constitutional: '[]'$ Weight loss  '[]'$ Fever  '[]'$ Chills Cardiac: '[]'$ Chest pain   '[]'$ Chest pressure   '[]'$ Palpitations   '[]'$ Shortness of breath when laying flat   '[]'$ Shortness of breath at rest   '[]'$ Shortness of breath with exertion. Vascular:  '[x]'$ Pain in legs with walking   '[x]'$ Pain in legs at rest   '[]'$ Pain in legs when laying flat   '[]'$ Claudication   '[]'$ Pain in feet when walking  '[]'$ Pain in feet at rest  '[]'$ Pain in feet when laying flat   '[]'$ History of DVT   '[]'$ Phlebitis   '[x]'$ Swelling in legs   '[]'$ Varicose veins   '[]'$ Non-healing ulcers Pulmonary:   '[]'$ Uses home oxygen   '[]'$ Productive cough   '[]'$ Hemoptysis   '[]'$ Wheeze  '[]'$ COPD   '[]'$ Asthma Neurologic:  '[]'$ Dizziness  '[]'$ Blackouts   '[]'$ Seizures   '[]'$ History of stroke   '[]'$ History of TIA  '[]'$ Aphasia   '[]'$ Temporary blindness   '[]'$ Dysphagia   '[]'$ Weakness or numbness in arms   '[]'$ Weakness or numbness in legs Musculoskeletal:  '[x]'$ Arthritis   '[]'$ Joint swelling   '[x]'$ Joint pain    '[]'$ Low back pain Hematologic:  '[]'$ Easy bruising  '[]'$ Easy bleeding   '[]'$ Hypercoagulable state   '[]'$ Anemic  '[]'$ Hepatitis Gastrointestinal:  '[]'$ Blood in stool   '[]'$ Vomiting blood  '[]'$ Gastroesophageal reflux/heartburn   '[]'$ Abdominal pain Genitourinary:  '[x]'$ Chronic kidney disease   '[]'$ Difficult urination  '[]'$ Frequent urination  '[]'$ Burning with urination   '[]'$ Hematuria Skin:  '[]'$ Rashes   '[]'$ Ulcers   '[]'$ Wounds Psychological:  '[]'$ History of anxiety   '[]'$  History of major depression.  Physical Examination  BP (!) 182/84 (BP Location: Left Arm)   Pulse 67   Resp 18   Ht '4\' 11"'$  (1.499 m)   Wt 187 lb 12.8 oz (85.2 kg)   BMI 37.93 kg/m  Gen:  WD/WN, NAD Head: [place]/AT, No temporalis wasting. Ear/Nose/Throat: Hearing grossly intact, nares w/o erythema or drainage Eyes: Conjunctiva clear. Sclera non-icteric Neck: Supple.  Trachea midline Pulmonary:  Good air movement, no use of accessory muscles.  Cardiac: RRR, no JVD Vascular:  Vessel Right Left  Radial Palpable Palpable           Musculoskeletal: M/S 5/5 throughout.  No deformity or atrophy. 2+ BLE edema. Walks with a walker Neurologic: Sensation grossly intact in extremities.  Symmetrical.  Speech is fluent.  Psychiatric: Judgment intact, Mood & affect appropriate for pt's clinical situation. Dermatologic: No rashes or ulcers noted.  No cellulitis or open wounds.      Labs No results found for this or any previous visit (from the past 2160 hour(s)).  Radiology No results found.  Assessment/Plan CKD (chronic kidney disease) stage 3, GFR 30-59 ml/min (HCC) Definitely a contributor to her LE swelling.   Type 2 DM with CKD stage 4 and hypertension (HCC) blood glucose control important in reducing the progression of atherosclerotic disease. Also, involved in wound healing. On appropriate medications.     Essential (primary) hypertension blood pressure control important in reducing the progression of atherosclerotic disease. On appropriate oral  medications.  Lymphedema Swelling is a little worse recently.  She asks about a diuretic which I will defer to her primary care provider who she sees next week.  She does have significant underlying chronic kidney disease but if felt safe, diuretic may be helpful therapy to remove some of the fluid.  She will continue to wear her compression socks and elevate her legs.  She will  continue to be as active as possible.  We will see her back in a few months to check on her swelling.    Leotis Pain, MD  09/04/2022 12:11 PM    This note was created with Dragon medical transcription system.  Any errors from dictation are purely unintentional

## 2022-09-17 ENCOUNTER — Encounter (INDEPENDENT_AMBULATORY_CARE_PROVIDER_SITE_OTHER): Payer: Self-pay

## 2022-11-16 ENCOUNTER — Ambulatory Visit (INDEPENDENT_AMBULATORY_CARE_PROVIDER_SITE_OTHER): Payer: Medicare Other | Admitting: Vascular Surgery

## 2022-12-05 ENCOUNTER — Ambulatory Visit (INDEPENDENT_AMBULATORY_CARE_PROVIDER_SITE_OTHER): Payer: 59 | Admitting: Nurse Practitioner

## 2022-12-05 ENCOUNTER — Encounter (INDEPENDENT_AMBULATORY_CARE_PROVIDER_SITE_OTHER): Payer: Self-pay | Admitting: Nurse Practitioner

## 2022-12-05 VITALS — BP 176/76 | HR 67 | Ht 59.0 in | Wt 191.0 lb

## 2022-12-05 DIAGNOSIS — E1122 Type 2 diabetes mellitus with diabetic chronic kidney disease: Secondary | ICD-10-CM

## 2022-12-05 DIAGNOSIS — I890 Lymphedema, not elsewhere classified: Secondary | ICD-10-CM | POA: Diagnosis not present

## 2022-12-05 DIAGNOSIS — N183 Chronic kidney disease, stage 3 unspecified: Secondary | ICD-10-CM

## 2022-12-05 DIAGNOSIS — N184 Chronic kidney disease, stage 4 (severe): Secondary | ICD-10-CM

## 2022-12-05 DIAGNOSIS — I129 Hypertensive chronic kidney disease with stage 1 through stage 4 chronic kidney disease, or unspecified chronic kidney disease: Secondary | ICD-10-CM | POA: Diagnosis not present

## 2022-12-05 NOTE — Progress Notes (Signed)
Subjective:    Patient ID: Heather Long, female    DOB: 1928/02/24, 89 y.o.   MRN: [id number] Chief Complaint  Patient presents with   Follow-up    Would like compression socks    Heather Long is a 89 year old female who returns today for follow-up of her lymphedema.  The patient has been doing well with her lymphedema pump.  She uses it for approximately 30 minutes in the evening every other night.  She does not wear compression because it is difficult to place with her arthritis.  However she would actively like to begin wearing them again.  Currently there are no open wounds or ulcerations.  No evidence of cellulitis.    Review of Systems  Cardiovascular:  Positive for leg swelling.  Musculoskeletal:  Positive for arthralgias and gait problem.  All other systems reviewed and are negative.      Objective:   Physical Exam Vitals reviewed.  HENT:     Head: Normocephalic.  Cardiovascular:     Rate and Rhythm: Normal rate.     Pulses: Normal pulses.  Pulmonary:     Effort: Pulmonary effort is normal.  Musculoskeletal:     Right lower leg: Edema present.     Left lower leg: Edema present.  Skin:    General: Skin is warm and dry.  Neurological:     Mental Status: She is alert and oriented to person, place, and time.  Psychiatric:        Mood and Affect: Mood normal.        Behavior: Behavior normal.        Thought Content: Thought content normal.        Judgment: Judgment normal.     BP (!) 176/76   Pulse 67   Ht '4\' 11"'$  (1.499 m)   Wt 191 lb (86.6 kg)   BMI 38.58 kg/m   Past Medical History:  Diagnosis Date   Cancer ([place])    Chronic kidney disease    Diabetes mellitus without complication (HCC)    Gout    Hyperlipidemia    Hypertension    Lymphedema    Obesity    Osteoporosis    Paget's disease of bone    Spinal stenosis     Social History   Socioeconomic History   Marital status: Divorced    Spouse name: Not on file   Number of  children: 2   Years of education: Not on file   Highest education level: 12th grade  Occupational History   Occupation: Retired  Tobacco Use   Smoking status: Former    Packs/day: 0.25    Years: 2.00    Total pack years: 0.50    Types: Cigarettes    Quit date: 01/02/1952    Years since quitting: 70.9   Smokeless tobacco: Never   Tobacco comments:    smoking cessation materials not required  Vaping Use   Vaping Use: Never used  Substance and Sexual Activity   Alcohol use: No   Drug use: No   Sexual activity: Not Currently  Other Topics Concern   Not on file  Social History Narrative   Not on file   Social Determinants of Health   Financial Resource Strain: Low Risk  (12/09/2017)   Overall Financial Resource Strain (CARDIA)    Difficulty of Paying Living Expenses: Not hard at all  Food Insecurity: No Food Insecurity (12/09/2017)   Hunger Vital Sign    Worried About Running Out of Food  in the Last Year: Never true    [place] in the Last Year: Never true  Transportation Needs: No Transportation Needs (12/09/2017)   PRAPARE - Hydrologist (Medical): No    Lack of Transportation (Non-Medical): No  Physical Activity: Inactive (12/09/2017)   Exercise Vital Sign    Days of Exercise per Week: 0 days    Minutes of Exercise per Session: 0 min  Stress: No Stress Concern Present (12/09/2017)   [place]    Feeling of Stress : Only a little  Social Connections: Unknown (12/09/2017)   Social Connection and Isolation Panel [NHANES]    Frequency of Communication with Friends and Family: Patient refused    Frequency of Social Gatherings with Friends and Family: Patient refused    Attends Religious Services: Patient refused    Active Member of Clubs or Organizations: Patient refused    Attends Archivist Meetings: Patient refused    Marital Status: Divorced  Human resources officer  Violence: Not At Risk (12/09/2017)   Humiliation, Afraid, Rape, and Kick questionnaire    Fear of Current or Ex-Partner: No    Emotionally Abused: No    Physically Abused: No    Sexually Abused: No    Past Surgical History:  Procedure Laterality Date   ABDOMINAL HYSTERECTOMY     JOINT REPLACEMENT  2000, 2006   MASTECTOMY Left 2010   ORIF WRIST FRACTURE  07/2016    Family History  Problem Relation Age of Onset   Diabetes Mother    Cancer Mother    Heart disease Father    Throat cancer Sister    Heart disease Brother    Diabetes Brother    Cancer Sister    Heart disease Brother    Heart disease Brother     Allergies  Allergen Reactions   Ciprofloxacin Nausea And Vomiting   Gluten Meal Other (See Comments)    GI distress   Pioglitazone Other (See Comments)    Leg swelling   Gabapentin Diarrhea, Nausea Only and Other (See Comments)    dizziness       Latest Ref Rng & Units 08/18/2018   10:49 AM 12/17/2016    3:32 PM 05/02/2016    4:27 PM  CBC  WBC 3.4 - 10.8 x10E3/uL 5.0  5.4  6.0   Hemoglobin 11.1 - 15.9 g/dL 12.7  12.9  12.1   Hematocrit 34.0 - 46.6 % 38.3  39.3  36.6   Platelets 150 - 450 x10E3/uL 173  171  168       CMP     Component Value Date/Time   NA 142 08/18/2018 1049   NA 142 03/14/2015 1539   K 5.0 08/18/2018 1049   K 4.2 03/14/2015 1539   CL 107 (H) 08/18/2018 1049   CL 110 03/14/2015 1539   CO2 19 (L) 08/18/2018 1049   CO2 26 03/14/2015 1539   GLUCOSE 158 (H) 08/18/2018 1049   GLUCOSE 115 (H) 03/14/2015 1539   BUN 26 08/18/2018 1049   BUN 23 (H) 03/14/2015 1539   CREATININE 1.26 (H) 08/18/2018 1049   CREATININE 1.30 (H) 03/14/2015 1539   CALCIUM 9.5 08/18/2018 1049   CALCIUM 9.3 03/14/2015 1539   PROT 6.0 04/15/2018 0954   PROT 7.0 03/14/2015 1539   ALBUMIN 4.0 04/15/2018 0954   ALBUMIN 4.1 03/14/2015 1539   AST 16 04/15/2018 0954   AST 17 03/14/2015 1539  ALT 12 04/15/2018 0954   ALT 12 (L) 03/14/2015 1539   ALKPHOS 61  04/15/2018 0954   ALKPHOS 60 03/14/2015 1539   BILITOT 0.3 04/15/2018 0954   BILITOT 0.3 03/14/2015 1539   GFRNONAA 38 (L) 08/18/2018 1049   GFRNONAA 37 (L) 03/14/2015 1539   GFRAA 44 (L) 08/18/2018 1049   GFRAA 43 (L) 03/14/2015 1539     No results found.     Assessment & Plan:   1. Lymphedema Currently the lymphedema pump has been a very positive factor for the patient.  She is using it regularly.  She is not utilizing the compression socks as she does not currently have any once that fit her well.  She was given a prescription for compression socks as well as a recommendation for compression wraps given that her arthritis makes it difficult.  Because the patient swelling is doing much better we will plan on having her return in 6 months or sooner if she begins to have issues.  2. Type 2 DM with CKD stage 4 and hypertension ([place]) Continue hypoglycemic medications as already ordered, these medications have been reviewed and there are no changes at this time.  Hgb A1C to be monitored as already arranged by primary service  3. Stage 3 chronic kidney disease, unspecified whether stage 3a or 3b CKD ([place]) This can play a factor in the patient's lower extremity edema.  Patient should continue to follow with PCP   Current Outpatient Medications on File Prior to Visit  Medication Sig Dispense Refill   bismuth subsalicylate (PEPTO BISMOL) 262 MG chewable tablet Chew 524 mg by mouth as needed.     Cholecalciferol (VITAMIN D3) 5000 units CAPS Take 50,000 Units by mouth daily. Daily      dicyclomine (BENTYL) 10 MG capsule Take 10 mg by mouth every 12 (twelve) hours as needed for spasms.     escitalopram (LEXAPRO) 5 MG tablet Take 5 mg by mouth daily.     glipiZIDE (GLUCOTROL XL) 2.5 MG 24 hr tablet Take 2.5 mg by mouth daily with breakfast.     ibuprofen (ADVIL) 200 MG tablet Take 200 mg by mouth every 6 (six) hours as needed.     losartan (COZAAR) 100 MG tablet Take 100 mg by mouth daily.       Multiple Vitamins-Minerals (PRESERVISION AREDS PO) Take by mouth.     pantoprazole (PROTONIX) 40 MG tablet Take 40 mg by mouth daily.     vitamin B-12 (CYANOCOBALAMIN) 500 MCG tablet Take 1,500 mcg by mouth daily.     No current facility-administered medications on file prior to visit.    There are no Patient Instructions on file for this visit. No follow-ups on file.   Kris Hartmann, NP

## 2022-12-08 ENCOUNTER — Other Ambulatory Visit: Payer: Self-pay

## 2022-12-08 DIAGNOSIS — C189 Malignant neoplasm of colon, unspecified: Principal | ICD-10-CM | POA: Diagnosis present

## 2022-12-08 DIAGNOSIS — Z888 Allergy status to other drugs, medicaments and biological substances status: Secondary | ICD-10-CM

## 2022-12-08 DIAGNOSIS — E1122 Type 2 diabetes mellitus with diabetic chronic kidney disease: Secondary | ICD-10-CM | POA: Diagnosis present

## 2022-12-08 DIAGNOSIS — K56609 Unspecified intestinal obstruction, unspecified as to partial versus complete obstruction: Secondary | ICD-10-CM | POA: Diagnosis not present

## 2022-12-08 DIAGNOSIS — D123 Benign neoplasm of transverse colon: Secondary | ICD-10-CM | POA: Diagnosis present

## 2022-12-08 DIAGNOSIS — E669 Obesity, unspecified: Secondary | ICD-10-CM | POA: Diagnosis present

## 2022-12-08 DIAGNOSIS — Z9012 Acquired absence of left breast and nipple: Secondary | ICD-10-CM

## 2022-12-08 DIAGNOSIS — Z91018 Allergy to other foods: Secondary | ICD-10-CM

## 2022-12-08 DIAGNOSIS — I129 Hypertensive chronic kidney disease with stage 1 through stage 4 chronic kidney disease, or unspecified chronic kidney disease: Secondary | ICD-10-CM | POA: Diagnosis present

## 2022-12-08 DIAGNOSIS — Z808 Family history of malignant neoplasm of other organs or systems: Secondary | ICD-10-CM

## 2022-12-08 DIAGNOSIS — K56600 Partial intestinal obstruction, unspecified as to cause: Secondary | ICD-10-CM | POA: Diagnosis present

## 2022-12-08 DIAGNOSIS — K8020 Calculus of gallbladder without cholecystitis without obstruction: Secondary | ICD-10-CM | POA: Diagnosis present

## 2022-12-08 DIAGNOSIS — Z79899 Other long term (current) drug therapy: Secondary | ICD-10-CM

## 2022-12-08 DIAGNOSIS — N1832 Chronic kidney disease, stage 3b: Secondary | ICD-10-CM | POA: Diagnosis present

## 2022-12-08 DIAGNOSIS — D519 Vitamin B12 deficiency anemia, unspecified: Secondary | ICD-10-CM | POA: Diagnosis present

## 2022-12-08 DIAGNOSIS — E876 Hypokalemia: Secondary | ICD-10-CM | POA: Diagnosis present

## 2022-12-08 DIAGNOSIS — E785 Hyperlipidemia, unspecified: Secondary | ICD-10-CM | POA: Diagnosis present

## 2022-12-08 DIAGNOSIS — M109 Gout, unspecified: Secondary | ICD-10-CM | POA: Diagnosis present

## 2022-12-08 DIAGNOSIS — I2510 Atherosclerotic heart disease of native coronary artery without angina pectoris: Secondary | ICD-10-CM | POA: Diagnosis present

## 2022-12-08 DIAGNOSIS — Z882 Allergy status to sulfonamides status: Secondary | ICD-10-CM

## 2022-12-08 DIAGNOSIS — Z8249 Family history of ischemic heart disease and other diseases of the circulatory system: Secondary | ICD-10-CM

## 2022-12-08 DIAGNOSIS — I160 Hypertensive urgency: Secondary | ICD-10-CM | POA: Diagnosis present

## 2022-12-08 DIAGNOSIS — Z833 Family history of diabetes mellitus: Secondary | ICD-10-CM

## 2022-12-08 DIAGNOSIS — Z6838 Body mass index (BMI) 38.0-38.9, adult: Secondary | ICD-10-CM

## 2022-12-08 DIAGNOSIS — Z7984 Long term (current) use of oral hypoglycemic drugs: Secondary | ICD-10-CM

## 2022-12-08 DIAGNOSIS — M810 Age-related osteoporosis without current pathological fracture: Secondary | ICD-10-CM | POA: Diagnosis present

## 2022-12-08 DIAGNOSIS — E872 Acidosis, unspecified: Secondary | ICD-10-CM | POA: Diagnosis present

## 2022-12-08 DIAGNOSIS — Z87891 Personal history of nicotine dependence: Secondary | ICD-10-CM

## 2022-12-08 LAB — CBC
HCT: 35.1 % — ABNORMAL LOW (ref 36.0–46.0)
Hemoglobin: 10.4 g/dL — ABNORMAL LOW (ref 12.0–15.0)
MCH: 27 pg (ref 26.0–34.0)
MCHC: 29.6 g/dL — ABNORMAL LOW (ref 30.0–36.0)
MCV: 91.2 fL (ref 80.0–100.0)
Platelets: 163 10*3/uL (ref 150–400)
RBC: 3.85 MIL/uL — ABNORMAL LOW (ref 3.87–5.11)
RDW: 14.3 % (ref 11.5–15.5)
WBC: 5.2 10*3/uL (ref 4.0–10.5)
nRBC: 0 % (ref 0.0–0.2)

## 2022-12-08 LAB — COMPREHENSIVE METABOLIC PANEL
ALT: 9 U/L (ref 0–44)
AST: 17 U/L (ref 15–41)
Albumin: 3.7 g/dL (ref 3.5–5.0)
Alkaline Phosphatase: 58 U/L (ref 38–126)
Anion gap: 8 (ref 5–15)
BUN: 20 mg/dL (ref 8–23)
CO2: 19 mmol/L — ABNORMAL LOW (ref 22–32)
Calcium: 9.1 mg/dL (ref 8.9–10.3)
Chloride: 111 mmol/L (ref 98–111)
Creatinine, Ser: 1.38 mg/dL — ABNORMAL HIGH (ref 0.44–1.00)
GFR, Estimated: 36 mL/min — ABNORMAL LOW (ref >60)
Glucose, Bld: 163 mg/dL — ABNORMAL HIGH (ref 70–99)
Potassium: 3.7 mmol/L (ref 3.5–5.1)
Sodium: 138 mmol/L (ref 135–145)
Total Bilirubin: 0.8 mg/dL (ref 0.3–1.2)
Total Protein: 6.2 g/dL — ABNORMAL LOW (ref 6.5–8.1)

## 2022-12-08 LAB — LIPASE, BLOOD: Lipase: 30 U/L (ref 11–51)

## 2022-12-08 NOTE — ED Triage Notes (Signed)
Pt denies abd pain currently

## 2022-12-08 NOTE — ED Notes (Signed)
This RN attempted to contact [facility] on behalf of the patient to notify them of her arrival here - the attempt was unsuccessful. Phone number [phone])

## 2022-12-08 NOTE — ED Triage Notes (Signed)
BIB [facility] from [facility] Pt called EMS herself and [facility] is not aware pt is here per pt and EMS report  Abd pain intermittent x7 months with diarrhea. Pt supposed to be on a gluten free diet but facility charges more for that diet so she has been opting out of it. Pt reports diarrhea and incomplete bm. Denies n/v. Pt reports pain "pushed me absolutely over the edge and I couldn't tolerate it."  Pt alert and oriented on arrival. Pt reports ambulatory with walker at baseline. Pt denies chest pain or pressure. Pt breathing unlabored speaking in full sentences with symmetric chest rise and fall.   142/68 HR 72 97% RA RR 18

## 2022-12-09 ENCOUNTER — Inpatient Hospital Stay
Admission: EM | Admit: 2022-12-09 | Discharge: 2022-12-12 | DRG: 375 | Disposition: A | Discharge status: Home Health | Payer: 59 | Attending: Internal Medicine | Admitting: Internal Medicine

## 2022-12-09 ENCOUNTER — Emergency Department: Payer: 59

## 2022-12-09 DIAGNOSIS — E872 Acidosis, unspecified: Secondary | ICD-10-CM | POA: Insufficient documentation

## 2022-12-09 DIAGNOSIS — E1122 Type 2 diabetes mellitus with diabetic chronic kidney disease: Secondary | ICD-10-CM | POA: Diagnosis present

## 2022-12-09 DIAGNOSIS — D123 Benign neoplasm of transverse colon: Secondary | ICD-10-CM | POA: Diagnosis present

## 2022-12-09 DIAGNOSIS — E876 Hypokalemia: Secondary | ICD-10-CM | POA: Diagnosis present

## 2022-12-09 DIAGNOSIS — K56609 Unspecified intestinal obstruction, unspecified as to partial versus complete obstruction: Secondary | ICD-10-CM

## 2022-12-09 DIAGNOSIS — Z8249 Family history of ischemic heart disease and other diseases of the circulatory system: Secondary | ICD-10-CM | POA: Diagnosis not present

## 2022-12-09 DIAGNOSIS — Z833 Family history of diabetes mellitus: Secondary | ICD-10-CM | POA: Diagnosis not present

## 2022-12-09 DIAGNOSIS — I160 Hypertensive urgency: Secondary | ICD-10-CM | POA: Diagnosis present

## 2022-12-09 DIAGNOSIS — Z888 Allergy status to other drugs, medicaments and biological substances status: Secondary | ICD-10-CM | POA: Diagnosis not present

## 2022-12-09 DIAGNOSIS — K6389 Other specified diseases of intestine: Secondary | ICD-10-CM | POA: Diagnosis not present

## 2022-12-09 DIAGNOSIS — N1832 Chronic kidney disease, stage 3b: Secondary | ICD-10-CM | POA: Diagnosis present

## 2022-12-09 DIAGNOSIS — E119 Type 2 diabetes mellitus without complications: Secondary | ICD-10-CM

## 2022-12-09 DIAGNOSIS — K56600 Partial intestinal obstruction, unspecified as to cause: Secondary | ICD-10-CM | POA: Diagnosis present

## 2022-12-09 DIAGNOSIS — I129 Hypertensive chronic kidney disease with stage 1 through stage 4 chronic kidney disease, or unspecified chronic kidney disease: Secondary | ICD-10-CM | POA: Diagnosis present

## 2022-12-09 DIAGNOSIS — Z6838 Body mass index (BMI) 38.0-38.9, adult: Secondary | ICD-10-CM | POA: Diagnosis not present

## 2022-12-09 DIAGNOSIS — Z882 Allergy status to sulfonamides status: Secondary | ICD-10-CM | POA: Diagnosis not present

## 2022-12-09 DIAGNOSIS — D519 Vitamin B12 deficiency anemia, unspecified: Secondary | ICD-10-CM | POA: Diagnosis present

## 2022-12-09 DIAGNOSIS — Z808 Family history of malignant neoplasm of other organs or systems: Secondary | ICD-10-CM | POA: Diagnosis not present

## 2022-12-09 DIAGNOSIS — Z7189 Other specified counseling: Secondary | ICD-10-CM | POA: Diagnosis not present

## 2022-12-09 DIAGNOSIS — R109 Unspecified abdominal pain: Secondary | ICD-10-CM | POA: Diagnosis present

## 2022-12-09 DIAGNOSIS — I2510 Atherosclerotic heart disease of native coronary artery without angina pectoris: Secondary | ICD-10-CM | POA: Diagnosis present

## 2022-12-09 DIAGNOSIS — R1084 Generalized abdominal pain: Principal | ICD-10-CM

## 2022-12-09 DIAGNOSIS — Z91018 Allergy to other foods: Secondary | ICD-10-CM | POA: Diagnosis not present

## 2022-12-09 DIAGNOSIS — M109 Gout, unspecified: Secondary | ICD-10-CM | POA: Diagnosis present

## 2022-12-09 DIAGNOSIS — Z87891 Personal history of nicotine dependence: Secondary | ICD-10-CM | POA: Diagnosis not present

## 2022-12-09 DIAGNOSIS — K8020 Calculus of gallbladder without cholecystitis without obstruction: Secondary | ICD-10-CM | POA: Diagnosis present

## 2022-12-09 DIAGNOSIS — E669 Obesity, unspecified: Secondary | ICD-10-CM | POA: Diagnosis present

## 2022-12-09 DIAGNOSIS — D649 Anemia, unspecified: Secondary | ICD-10-CM | POA: Diagnosis present

## 2022-12-09 DIAGNOSIS — M810 Age-related osteoporosis without current pathological fracture: Secondary | ICD-10-CM | POA: Diagnosis present

## 2022-12-09 DIAGNOSIS — E785 Hyperlipidemia, unspecified: Secondary | ICD-10-CM | POA: Diagnosis present

## 2022-12-09 DIAGNOSIS — C189 Malignant neoplasm of colon, unspecified: Secondary | ICD-10-CM | POA: Diagnosis present

## 2022-12-09 LAB — URINALYSIS, ROUTINE W REFLEX MICROSCOPIC
Bacteria, UA: NONE SEEN
Bilirubin Urine: NEGATIVE
Glucose, UA: NEGATIVE mg/dL
Ketones, ur: NEGATIVE mg/dL
Leukocytes,Ua: NEGATIVE
Nitrite: NEGATIVE
Protein, ur: 100 mg/dL — AB
Specific Gravity, Urine: 1.016 (ref 1.005–1.030)
pH: 5 (ref 5.0–8.0)

## 2022-12-09 LAB — GLUCOSE, CAPILLARY
Glucose-Capillary: 127 mg/dL — ABNORMAL HIGH (ref 70–99)
Glucose-Capillary: 137 mg/dL — ABNORMAL HIGH (ref 70–99)
Glucose-Capillary: 137 mg/dL — ABNORMAL HIGH (ref 70–99)
Glucose-Capillary: 234 mg/dL — ABNORMAL HIGH (ref 70–99)
Glucose-Capillary: 83 mg/dL (ref 70–99)
Glucose-Capillary: 113 mg/dL — ABNORMAL HIGH (ref 70–99)

## 2022-12-09 LAB — HEMOGLOBIN A1C
Hgb A1c MFr Bld: 7.1 % — ABNORMAL HIGH (ref 4.8–5.6)
Mean Plasma Glucose: 157.07 mg/dL

## 2022-12-09 MED ORDER — LOSARTAN POTASSIUM 50 MG PO TABS
100.0000 mg | ORAL_TABLET | Freq: Every day | ORAL | Status: DC
  Administered 2022-12-09 – 2022-12-10 (×2): 100 mg via ORAL
  Filled 2022-12-09 (×2): qty 2

## 2022-12-09 MED ORDER — SODIUM CHLORIDE 0.9 % IV SOLN: INTRAVENOUS | Status: AC

## 2022-12-09 MED ORDER — IOHEXOL 300 MG/ML  SOLN
75.0000 mL | Freq: Once | INTRAMUSCULAR | Status: AC | PRN
  Administered 2022-12-09: 75 mL via INTRAVENOUS

## 2022-12-09 MED ORDER — METOPROLOL TARTRATE 5 MG/5ML IV SOLN
2.5000 mg | Freq: Two times a day (BID) | INTRAVENOUS | Status: DC
  Administered 2022-12-09 – 2022-12-12 (×6): 2.5 mg via INTRAVENOUS
  Filled 2022-12-09 (×6): qty 5

## 2022-12-09 MED ORDER — ONDANSETRON HCL 4 MG PO TABS: 4.0000 mg | ORAL_TABLET | Freq: Four times a day (QID) | ORAL | Status: DC | PRN

## 2022-12-09 MED ORDER — SODIUM CHLORIDE 0.9 % IV BOLUS
500.0000 mL | Freq: Once | INTRAVENOUS | Status: AC
  Administered 2022-12-09: 500 mL via INTRAVENOUS

## 2022-12-09 MED ORDER — ACETAMINOPHEN 650 MG RE SUPP: 650.0000 mg | Freq: Four times a day (QID) | RECTAL | Status: DC | PRN

## 2022-12-09 MED ORDER — HYDROCODONE-ACETAMINOPHEN 5-325 MG PO TABS: 1.0000 | ORAL_TABLET | ORAL | Status: DC | PRN

## 2022-12-09 MED ORDER — PANTOPRAZOLE SODIUM 40 MG PO TBEC
40.0000 mg | DELAYED_RELEASE_TABLET | Freq: Every day | ORAL | Status: DC
  Administered 2022-12-09 – 2022-12-12 (×4): 40 mg via ORAL
  Filled 2022-12-09 (×4): qty 1

## 2022-12-09 MED ORDER — ESCITALOPRAM OXALATE 10 MG PO TABS
5.0000 mg | ORAL_TABLET | Freq: Every day | ORAL | Status: DC
  Administered 2022-12-09 – 2022-12-12 (×4): 5 mg via ORAL
  Filled 2022-12-09 (×4): qty 1

## 2022-12-09 MED ORDER — ONDANSETRON HCL 4 MG/2ML IJ SOLN: 4.0000 mg | Freq: Four times a day (QID) | INTRAMUSCULAR | Status: DC | PRN

## 2022-12-09 MED ORDER — MORPHINE SULFATE (PF) 2 MG/ML IV SOLN: 2.0000 mg | INTRAVENOUS | Status: DC | PRN

## 2022-12-09 MED ORDER — ONDANSETRON 4 MG PO TBDP: 4.0000 mg | ORAL_TABLET | Freq: Once | ORAL | Status: DC

## 2022-12-09 MED ORDER — PEG 3350-KCL-NA BICARB-NACL 420 G PO SOLR
4000.0000 mL | Freq: Once | ORAL | Status: AC
  Administered 2022-12-09: 4000 mL via ORAL
  Filled 2022-12-09: qty 4000

## 2022-12-09 MED ORDER — ACETAMINOPHEN 325 MG PO TABS: 650.0000 mg | ORAL_TABLET | Freq: Four times a day (QID) | ORAL | Status: DC | PRN

## 2022-12-09 MED ORDER — HYDRALAZINE HCL 20 MG/ML IJ SOLN
5.0000 mg | INTRAMUSCULAR | Status: DC | PRN
  Administered 2022-12-09 (×3): 5 mg via INTRAVENOUS
  Filled 2022-12-09 (×3): qty 1

## 2022-12-09 MED ORDER — INSULIN ASPART 100 UNIT/ML IJ SOLN
0.0000 [IU] | INTRAMUSCULAR | Status: DC
  Administered 2022-12-09: 3 [IU] via SUBCUTANEOUS
  Administered 2022-12-09: 7 [IU] via SUBCUTANEOUS
  Administered 2022-12-09 – 2022-12-10 (×2): 3 [IU] via SUBCUTANEOUS
  Administered 2022-12-10: 4 [IU] via SUBCUTANEOUS
  Administered 2022-12-10: 3 [IU] via SUBCUTANEOUS
  Administered 2022-12-10 – 2022-12-11 (×3): 4 [IU] via SUBCUTANEOUS
  Administered 2022-12-11: 3 [IU] via SUBCUTANEOUS
  Administered 2022-12-11: 4 [IU] via SUBCUTANEOUS
  Administered 2022-12-12: 7 [IU] via SUBCUTANEOUS
  Filled 2022-12-09 (×13): qty 1

## 2022-12-09 NOTE — Assessment & Plan Note (Addendum)
Continue home losartan Hydralazine IV as needed for additional BP control Also added IV Lopressor for better blood pressure control

## 2022-12-09 NOTE — ED Notes (Signed)
Pt to Ct

## 2022-12-09 NOTE — ED Provider Notes (Signed)
[facility] Provider Note    Event Date/Time   First MD Initiated Contact with Patient 12/09/22 0100     (approximate)   History   Abdominal Pain   HPI  Heather Long is a 89 y.o. female who presents for evaluation of generalized abdominal pain.  She said that it comes in waves and then will go away completely.  It feels sharp and stabbing and cramping when it happens.  She had nausea earlier but no vomiting.  She said that she is frequently constipated.  She had a partial bowel movement earlier and said that it did not come all the way out so she pushed it back up.  She said that she thinks that she is allergic to gluten, but she said that she cannot stick with a gluten-free diet at [facility].  She believes that this contributes to constipation and her abdominal pain.  She denies chest pain or shortness of breath.  She denies fever.  She denies dysuria.  She is currently having no abdominal pain.  She said that she thought it was going to start back up again but it did not.     Physical Exam   Triage Vital Signs: ED Triage Vitals [12/08/22 2259]  Enc Vitals Group     BP (!) 222/84     Pulse Rate 69     Resp 18     Temp 98.4 F (36.9 C)     Temp Source Oral     SpO2 95 %     Weight 86.6 kg (191 lb)     Height 1.499 m ('4\' 11"'$ )     Head Circumference      Peak Flow      Pain Score 0     Pain Loc      Pain Edu?      Excl. in [place]?     Most recent vital signs: Vitals:   12/09/22 0530 12/09/22 0600  BP: (!) 185/61 (!) 146/50  Pulse: 65 61  Resp: 18 20  Temp: 99 F (37.2 C)   SpO2: 100% 98%    General: Awake, no distress.  CV:  Good peripheral perfusion.  Regular rate and rhythm Resp:  Normal effort.  Speaking easily and comfortably, no accessory muscle usage nor intercostal retractions. Abd:  No distention.  Mild generalized tenderness to palpation throughout the abdomen.  Some guarding, but improved with distraction. Other:  Awake  and alert, oriented, pleasant mood and affect.   ED Results / Procedures / Treatments   Labs (all labs ordered are listed, but only abnormal results are displayed) Labs Reviewed  COMPREHENSIVE METABOLIC PANEL - Abnormal; Notable for the following components:      Result Value   CO2 19 (*)    Glucose, Bld 163 (*)    Creatinine, Ser 1.38 (*)    Total Protein 6.2 (*)    GFR, Estimated 36 (*)    All other components within normal limits  CBC - Abnormal; Notable for the following components:   RBC 3.85 (*)    Hemoglobin 10.4 (*)    HCT 35.1 (*)    MCHC 29.6 (*)    All other components within normal limits  URINALYSIS, ROUTINE W REFLEX MICROSCOPIC - Abnormal; Notable for the following components:   Color, Urine STRAW (*)    APPearance CLEAR (*)    Hgb urine dipstick SMALL (*)    Protein, ur 100 (*)    All other components within  normal limits  LIPASE, BLOOD  HEMOGLOBIN A1C     EKG  ED ECG REPORT I, Hinda Kehr, the attending physician, personally viewed and interpreted this ECG.  Date: 12/08/2022 EKG Time: 23: 09 Rate: 63 Rhythm: sinus rhythm with first-degree AV block QRS Axis: normal Intervals: normal ST/T Wave abnormalities: Non-specific ST segment / T-wave changes, but no clear evidence of acute ischemia. Narrative Interpretation: no definitive evidence of acute ischemia; does not meet STEMI criteria.    RADIOLOGY I viewed and interpreted the patient's CT abdomen pelvis.  There is an abnormality in the colon with an unusual appearance and is suggestive of a least a partial large bowel obstruction.  The radiologist further commented that there is an area that appears like an apple core lesion with this style partial small bowel obstruction.  The radiologist also mentions some concern for her atherosclerosis and the possibility of chronic mesenteric ischemia.    PROCEDURES:  Critical Care performed: No  Procedures   MEDICATIONS ORDERED IN ED: Medications   hydrALAZINE (APRESOLINE) injection 5 mg (has no administration in time range)  losartan (COZAAR) tablet 100 mg (has no administration in time range)  escitalopram (LEXAPRO) tablet 5 mg (has no administration in time range)  pantoprazole (PROTONIX) EC tablet 40 mg (has no administration in time range)  acetaminophen (TYLENOL) tablet 650 mg (has no administration in time range)    Or  acetaminophen (TYLENOL) suppository 650 mg (has no administration in time range)  ondansetron (ZOFRAN) tablet 4 mg (has no administration in time range)    Or  ondansetron (ZOFRAN) injection 4 mg (has no administration in time range)  insulin aspart (novoLOG) injection 0-20 Units (has no administration in time range)  0.9 %  sodium chloride infusion (has no administration in time range)  HYDROcodone-acetaminophen (NORCO/VICODIN) 5-325 MG per tablet 1-2 tablet (has no administration in time range)  morphine (PF) 2 MG/ML injection 2 mg (has no administration in time range)  sodium chloride 0.9 % bolus 500 mL (0 mLs Intravenous Stopped 12/09/22 0418)  iohexol (OMNIPAQUE) 300 MG/ML solution 75 mL (75 mLs Intravenous Contrast Given 12/09/22 0207)     IMPRESSION / MDM / ASSESSMENT AND PLAN / ED COURSE  I reviewed the triage vital signs and the nursing notes.                              Differential diagnosis includes, but is not limited to, constipation, SBO/ileus, viral illness, acute intra-abdominal infection such as diverticulitis, electrolyte or metabolic abnormality.  Patient's presentation is most consistent with acute presentation with potential threat to life or bodily function.  Lab/studies ordered: CMP, lipase, CBC, urinalysis, CT abdomen/pelvis.  Vital signs are reassuring.  Initial triage blood pressure was very elevated but I suspect this was erroneous.  On repeat her blood pressure was much closer to normal.  The patient is currently asymptomatic but she does have some generalized tenderness to  palpation.  I suspect that she is having waves of abdominal pain due to acute on chronic constipation.  However, acute intra-abdominal infection such as diverticulitis or even a partial SBO are not ruled out.  She has a mild elevation of her creatinine but this seems to be essentially baseline.  Her GFR is 36 but we should be able to proceed with a low-dose of IV contrast.  I am ordering normal saline 500 mL IV bolus to hydrate her as well.  No indication for analgesia  or antiemetics at this time given that she is currently asymptomatic.   Clinical Course as of 12/09/22 3291  Sun Dec 09, 2022  0430 As document above, the patient's CT scan is concerning for the possibility of a neoplastic apple core lesion which is leading to a partial small bowel obstruction.  This may explain both her pain and her bowel habit difficulties recently.  Additionally there was a question of chronic mesenteric ischemia, but I do not feel that this is an emergent issue.  Additionally she has an elevated creatinine and a GFR of 36 and she already received a contrasted CT study.  She would likely benefit from IV hydration before obtaining another CT scan with IV contrast (a CTA of the abdomen).  I consulted the hospitalist for admission after discussing the plan with the patient.  I believe that she would benefit from additional evaluation including GI consultation and possible colonoscopy.  She agrees with the plan. [CF]  9166 Delayed documentation: I discussed the case in person with Dr. Damita Dunnings with the hospitalist service who will admit the patient. [CF]    Clinical Course User Index [CF] Hinda Kehr, MD     FINAL CLINICAL IMPRESSION(S) / ED DIAGNOSES   Final diagnoses:  Generalized abdominal pain     Rx / DC Orders   ED Discharge Orders     None        Note:  This document was prepared using Dragon voice recognition software and may include unintentional dictation errors.   Hinda Kehr, MD 12/09/22  [phone]

## 2022-12-09 NOTE — Progress Notes (Signed)
Patient unable to tolerate prep, had two episodes of vomiting providers Jamse [place] and [place] notified. Patient had large BM, abdomen soft, non-tender. Verbal order to stop prep and monitor patient.

## 2022-12-09 NOTE — Assessment & Plan Note (Addendum)
Partial large bowel obstruction Secondary to possible colonic mass as well as visceral arteriosclerosis She had a large bowel movement this morning and has been having more since she started drinking colonoscopy prep last evening.  Bowel obstruction resolved.  Abdominal pain has been resolved also

## 2022-12-09 NOTE — H&P (Signed)
History and Physical    Patient: Heather Long IFO:[id number] DOB: 06/19/1928 DOA: 12/09/2022 DOS: the patient was seen and examined on 12/09/2022 PCP: Sallee Lange, NP  Patient coming from: Home  Chief Complaint:  Chief Complaint  Patient presents with   Abdominal Pain    HPI: Heather Long is a 89 y.o. female with medical history significant for HTN, DM, CKD 3B, chronic back pain who presents to the ED with a several week history of intermittent abdominal pain associated with nausea and ongoing constipation.  She denies fever or chills.  She has had no vomiting and denies dysuria.  Denies chest pain, shortness of breath or cough. ED course and data review: BP 222/84 with otherwise normal vitals.  Labs significant for normal WBC, hemoglobin 10.4,.   normal lipase and LFTs and creatinine 1.38 which is her baseline.  Urinalysis unremarkable. EKG G, personally viewed and interpreted showing NSR at 63 with no acute ST-T wave changes CT abdomen and pelvis with contrast significant for focal narrowing of the mid transverse colon and extensive visceral arteriosclerosis with recommendation for CT arteriography, further details below: IMPRESSION: 1. Extensive multi-vessel coronary artery calcification. Mild cardiomegaly. Morphologic changes in keeping with pulmonary arterial hypertension. 2. Cholelithiasis. 3. Focal narrowing of the mid transverse colon, new since prior examination, possibly representing a focal colonic stricture or apple-core mass. Correlation with colonoscopy may be helpful for further evaluation. Moderate stool proximal to this, possibly representing a resultant partial large bowel obstruction. 4. Extensive visceral arteriosclerosis with probable hemodynamically significant stenosis of the proximal and mid superior mesenteric artery as well as the renal arteries bilaterally. If there is clinical evidence of chronic mesenteric ischemia or  hemodynamically significant renal artery stenosis, CT arteriography may be helpful for further evaluation.   Patient given an NS bolus and hospitalist consulted for admission.   Review of Systems: As mentioned in the history of present illness. All other systems reviewed and are negative.  Past Medical History:  Diagnosis Date   Cancer ([place])    Chronic kidney disease    Diabetes mellitus without complication ([place])    Gout    Hyperlipidemia    Hypertension    Lymphedema    Obesity    Osteoporosis    Paget's disease of bone    Spinal stenosis    Past Surgical History:  Procedure Laterality Date   ABDOMINAL HYSTERECTOMY     JOINT REPLACEMENT  2000, 2006   MASTECTOMY Left 2010   ORIF WRIST FRACTURE  07/2016   Social History:  reports that she quit smoking about 70 years ago. Her smoking use included cigarettes. She has a 0.50 pack-year smoking history. She has never used smokeless tobacco. She reports that she does not drink alcohol and does not use drugs.  Allergies  Allergen Reactions   Ciprofloxacin Nausea And Vomiting   Gluten Meal Other (See Comments)    GI distress   Pioglitazone Other (See Comments)    Leg swelling   Gabapentin Diarrhea, Nausea Only and Other (See Comments)    dizziness    Family History  Problem Relation Age of Onset   Diabetes Mother    Cancer Mother    Heart disease Father    Throat cancer Sister    Heart disease Brother    Diabetes Brother    Cancer Sister    Heart disease Brother    Heart disease Brother     Prior to Admission medications   Medication Sig Start Date End Date Taking?  Authorizing Provider  bismuth subsalicylate (PEPTO BISMOL) 262 MG chewable tablet Chew 524 mg by mouth as needed.    [provider]  Cholecalciferol (VITAMIN D3) 5000 units CAPS Take 50,000 Units by mouth daily. Daily     [provider]  dicyclomine (BENTYL) 10 MG capsule Take 10 mg by mouth every 12 (twelve) hours as needed for  spasms.    [provider]  escitalopram (LEXAPRO) 5 MG tablet Take 5 mg by mouth daily.    [provider]  glipiZIDE (GLUCOTROL XL) 2.5 MG 24 hr tablet Take 2.5 mg by mouth daily with breakfast.    [provider]  ibuprofen (ADVIL) 200 MG tablet Take 200 mg by mouth every 6 (six) hours as needed.    [provider]  losartan (COZAAR) 100 MG tablet Take 100 mg by mouth daily.     [provider]  Multiple Vitamins-Minerals (PRESERVISION AREDS PO) Take by mouth.    [provider]  pantoprazole (PROTONIX) 40 MG tablet Take 40 mg by mouth daily.    [provider]  vitamin B-12 (CYANOCOBALAMIN) 500 MCG tablet Take 1,500 mcg by mouth daily.    [provider]    Physical Exam: Vitals:   12/08/22 2259 12/09/22 0130 12/09/22 0530  BP: (!) 222/84 (!) 168/89 (!) 185/61  Pulse: 69 64 65  Resp: '18 18 18  '$ Temp: 98.4 F (36.9 C) 98.4 F (36.9 C) 99 F (37.2 C)  TempSrc: Oral Oral Oral  SpO2: 95% 98% 100%  Weight: 86.6 kg    Height: '4\' 11"'$  (1.499 m)     Physical Exam Vitals and nursing note reviewed.  Constitutional:      General: She is not in acute distress. HENT:     Head: Normocephalic and atraumatic.  Cardiovascular:     Rate and Rhythm: Normal rate and regular rhythm.     Heart sounds: Normal heart sounds.  Pulmonary:     Effort: Pulmonary effort is normal.     Breath sounds: Normal breath sounds.  Abdominal:     Palpations: Abdomen is soft.     Tenderness: There is abdominal tenderness in the periumbilical area.  Neurological:     Mental Status: Mental status is at baseline.     Labs on Admission: I have personally reviewed following labs and imaging studies  CBC: Recent Labs  Lab 12/08/22 2310  WBC 5.2  HGB 10.4*  HCT 35.1*  MCV 91.2  PLT 588   Basic Metabolic Panel: Recent Labs  Lab 12/08/22 2310  NA 138  K 3.7  CL 111  CO2 19*  GLUCOSE 163*  BUN 20  CREATININE 1.38*  CALCIUM 9.1    GFR: Estimated Creatinine Clearance: 24.9 mL/min (A) (by C-G formula based on SCr of 1.38 mg/dL (H)). Liver Function Tests: Recent Labs  Lab 12/08/22 2310  AST 17  ALT 9  ALKPHOS 58  BILITOT 0.8  PROT 6.2*  ALBUMIN 3.7   Recent Labs  Lab 12/08/22 2310  LIPASE 30   No results for input(s): "AMMONIA" in the last 168 hours. Coagulation Profile: No results for input(s): "INR", "PROTIME" in the last 168 hours. Cardiac Enzymes: No results for input(s): "CKTOTAL", "CKMB", "CKMBINDEX", "TROPONINI" in the last 168 hours. BNP (last 3 results) No results for input(s): "PROBNP" in the last 8760 hours. HbA1C: No results for input(s): "HGBA1C" in the last 72 hours. CBG: No results for input(s): "GLUCAP" in the last 168 hours. Lipid Profile: No results for  input(s): "CHOL", "HDL", "LDLCALC", "TRIG", "CHOLHDL", "LDLDIRECT" in the last 72 hours. Thyroid Function Tests: No results for input(s): "TSH", "T4TOTAL", "FREET4", "T3FREE", "THYROIDAB" in the last 72 hours. Anemia Panel: No results for input(s): "VITAMINB12", "FOLATE", "FERRITIN", "TIBC", "IRON", "RETICCTPCT" in the last 72 hours. Urine analysis:    Component Value Date/Time   COLORURINE STRAW (A) 12/08/2022 2302   APPEARANCEUR CLEAR (A) 12/08/2022 2302   LABSPEC 1.016 12/08/2022 2302   PHURINE 5.0 12/08/2022 2302   GLUCOSEU NEGATIVE 12/08/2022 2302   HGBUR SMALL (A) 12/08/2022 2302   BILIRUBINUR NEGATIVE 12/08/2022 2302   BILIRUBINUR neg 11/14/2016 1114   KETONESUR NEGATIVE 12/08/2022 2302   PROTEINUR 100 (A) 12/08/2022 2302   UROBILINOGEN 0.2 11/14/2016 1114   NITRITE NEGATIVE 12/08/2022 2302   LEUKOCYTESUR NEGATIVE 12/08/2022 2302    Radiological Exams on Admission: CT ABDOMEN PELVIS W CONTRAST  Result Date: 12/09/2022 CLINICAL DATA:  Abdominal pain, acute, nonlocalized, diarrhea EXAM: CT ABDOMEN AND PELVIS WITH CONTRAST TECHNIQUE: Multidetector CT imaging of the abdomen and pelvis was performed using the standard  protocol following bolus administration of intravenous contrast. RADIATION DOSE REDUCTION: This exam was performed according to the departmental dose-optimization program which includes automated exposure control, adjustment of the mA and/or kV according to patient size and/or use of iterative reconstruction technique. CONTRAST:  74m OMNIPAQUE IOHEXOL 300 MG/ML  SOLN COMPARISON:  05/11/2008 FINDINGS: Lower chest: Extensive multi-vessel coronary artery calcification. Mild cardiomegaly. Central pulmonary arteries are enlarged in keeping with changes of pulmonary arterial hypertension. Visualized lung bases are clear. Small hiatal hernia. Hepatobiliary: Cholelithiasis without pericholecystic inflammatory change. Liver unremarkable. No intra or extrahepatic biliary ductal dilation. Pancreas: Unremarkable Spleen: Stable probable cyst or hemangioma within the splenic hilum. Scattered additional hypodensities within the spleen may relate to the phase of contrast enhancement. Normal size. Adrenals/Urinary Tract: The adrenal glands are unremarkable. Kidneys are normal in position. Mild bilateral renal cortical atrophy. No enhancing intrarenal masses. No hydronephrosis. No intrarenal or ureteral urinary calculi. Scattered calcifications within the right renal hilum likely represent vascular calcifications. The bladder is unremarkable. Stomach/Bowel: There is focal narrowing of the mid transverse colon best seen on axial image # 50/2 and coronal image # 18/5 which appears new since prior examination and may represent a focal colonic stricture or apple-core mass. There is moderate stool proximal to this, possibly representing a partial large bowel obstruction. The stomach, small bowel, and large bowel are otherwise unremarkable. Appendix normal. No free intraperitoneal gas or fluid. Vascular/Lymphatic: Moderate aortoiliac atherosclerotic calcification. No aortic aneurysm. Extensive visceral arteriosclerosis with probable  hemodynamically significant stenosis of the proximal and mid superior mesenteric artery as well as the renal arteries bilaterally. No pathologic adenopathy within the abdomen and pelvis. Reproductive: Status post hysterectomy. No adnexal masses. Other: Mild nonspecific subcutaneous body wall edema dependently and within the pannus. No abdominal wall hernia. Musculoskeletal: No acute bone abnormality. Degenerative changes are seen within the thoracolumbar spine. No lytic or blastic bone lesion. IMPRESSION: 1. Extensive multi-vessel coronary artery calcification. Mild cardiomegaly. Morphologic changes in keeping with pulmonary arterial hypertension. 2. Cholelithiasis. 3. Focal narrowing of the mid transverse colon, new since prior examination, possibly representing a focal colonic stricture or apple-core mass. Correlation with colonoscopy may be helpful for further evaluation. Moderate stool proximal to this, possibly representing a resultant partial large bowel obstruction. 4. Extensive visceral arteriosclerosis with probable hemodynamically significant stenosis of the proximal and mid superior mesenteric artery as well as the renal arteries bilaterally. If there is clinical evidence of chronic mesenteric ischemia  or hemodynamically significant renal artery stenosis, CT arteriography may be helpful for further evaluation. Aortic Atherosclerosis (ICD10-I70.0). Electronically Signed   By: Fidela Salisbury M.D.   On: 12/09/2022 02:36     Data Reviewed: Relevant notes from primary care and specialist visits, past discharge summaries as available in EHR, including Care Everywhere. Prior diagnostic testing as pertinent to current admission diagnoses Updated medications and problem lists for reconciliation ED course, including vitals, labs, imaging, treatment and response to treatment Triage notes, nursing and pharmacy notes and ED provider's notes Notable results as noted in HPI   Assessment and Plan: Abdominal  pain Partial large bowel obstruction Secondary to possible colonic mass as well as visceral arteriosclerosis Consider CTA abdomen and pelvis after contrast washout in 24 hours to evaluate for mesenteric ischemia WBC was normal.  Will get a lactic acid We will keep n.p.o. IV hydration GI consult   Hypertensive urgency Continue home losartan Hydralazine IV as needed for additional BP control  Anemia Stable  Stage 3b chronic kidney disease ([place]) Renal function at baseline  Type II diabetes mellitus ([place]) Sliding scale insulin coverage        DVT prophylaxis: SCD  Consults: GI, Dr. Vicente Males  Advance Care Planning: full code  Family Communication: none  Disposition Plan: Back to previous home environment  Severity of Illness: The appropriate patient status for this patient is INPATIENT. Inpatient status is judged to be reasonable and necessary in order to provide the required intensity of service to ensure the patient's safety. The patient's presenting symptoms, physical exam findings, and initial radiographic and laboratory data in the context of their chronic comorbidities is felt to place them at high risk for further clinical deterioration. Furthermore, it is not anticipated that the patient will be medically stable for discharge from the hospital within 2 midnights of admission.   * I certify that at the point of admission it is my clinical judgment that the patient will require inpatient hospital care spanning beyond 2 midnights from the point of admission due to high intensity of service, high risk for further deterioration and high frequency of surveillance required.*  Author: Athena Masse, MD 12/09/2022 5:32 AM  For on call review [email or web address].

## 2022-12-09 NOTE — Progress Notes (Addendum)
Briefly, patient is a 89 year old female who was admitted earlier this morning with severe constipation and nausea.  Workup reveals focal narrowing/stricture/possible apple core mass of the mid transverse colon with proximal fecal buildup.    Patient is also noted to have extensive visceral arterial sclerosis with probable significant stenoses of the proximal and mid superior mesenteric artery and bilateral renal arteries.   Patient was seen by gastroenterology who are planning for colonoscopy tomorrow morning. I spoke with patient at length about possible etiologies of her stricture as well as possible need for workup of what appears to be chronic intestinal angina.  I have attempted to call patient's son Kirke Shaggy however did not get a response.  Patient notes that she is not close with her son or daughter-in-law however they are her only family.  Patient with persistently elevated BP--possibly losartan is not getting absorbed given oral route, will add metop 2.5 bid standing in addition to prn hydralazine already ordered.

## 2022-12-09 NOTE — Assessment & Plan Note (Addendum)
Renal function at baseline.  Monitor and avoid nephrotoxic medications  Lab Results  Component Value Date   CREATININE 1.38 (H) 12/08/2022   CREATININE 1.26 (H) 08/18/2018   CREATININE 1.20 (H) 04/15/2018

## 2022-12-09 NOTE — Assessment & Plan Note (Signed)
Continue losartan. 

## 2022-12-09 NOTE — Assessment & Plan Note (Addendum)
Stable.  Likely of chronic disease

## 2022-12-09 NOTE — Consult Note (Addendum)
Heather Long , MD [address] [place], [place], [place], [id number] [address], [place], [place], [place], [address] Phone: [phone]  Fax: [phone]  Consultation  Referring Provider:     Dr Jamse Arn Primary Care Physician:  Sallee Lange, NP Primary Gastroenterologist: None       Reason for Consultation:     Abnormal Ct scan   Date of Admission:  12/09/2022 Date of Consultation:  12/09/2022         HPI:   Heather Long is a 89 y.o. female presented to the emergency room with abdominal pain of several weeks in duration.  CT abdomen and pelvis with contrast suggestive of focal narrowing of the mid transverse colon with extensive visceral arterial sclerosis.  Focal narrowing the mid transverse colon new since prior examination possibly representing a focal colonic stricture or apple core mass.  Moderate stool proximal possibly resulting partial large small bowel obstruction.  I been asked to eval this further.  Hemoglobin 10.4 g with an MCV of 91.  Creatinine 1.38. She says she has had abdominal discomfort ongoing for weeks, some nausea, no vomiting , stool has had an organge hue to it, recalls having had a colonoscopy many years back, no recent one. Denies any weight loss. No other complaints.   Past Medical History:  Diagnosis Date   Cancer ([facility])    Chronic kidney disease    Diabetes mellitus without complication ([facility])    Gout    Hyperlipidemia    Hypertension    Lymphedema    Obesity    Osteoporosis    Paget's disease of bone    Spinal stenosis     Past Surgical History:  Procedure Laterality Date   ABDOMINAL HYSTERECTOMY     JOINT REPLACEMENT  2000, 2006   MASTECTOMY Left 2010   ORIF WRIST FRACTURE  07/2016    Prior to Admission medications   Medication Sig Start Date End Date Taking? Authorizing Provider  bismuth subsalicylate (PEPTO BISMOL) 262 MG chewable tablet Chew 524 mg by mouth as needed.    [provider]  Cholecalciferol  (VITAMIN D3) 5000 units CAPS Take 50,000 Units by mouth daily. Daily     [provider]  dicyclomine (BENTYL) 10 MG capsule Take 10 mg by mouth every 12 (twelve) hours as needed for spasms.    [provider]  escitalopram (LEXAPRO) 5 MG tablet Take 5 mg by mouth daily.    [provider]  glipiZIDE (GLUCOTROL XL) 2.5 MG 24 hr tablet Take 2.5 mg by mouth daily with breakfast.    [provider]  ibuprofen (ADVIL) 200 MG tablet Take 200 mg by mouth every 6 (six) hours as needed.    [provider]  losartan (COZAAR) 100 MG tablet Take 100 mg by mouth daily.     [provider]  Multiple Vitamins-Minerals (PRESERVISION AREDS PO) Take by mouth.    [provider]  pantoprazole (PROTONIX) 40 MG tablet Take 40 mg by mouth daily.    [provider]  vitamin B-12 (CYANOCOBALAMIN) 500 MCG tablet Take 1,500 mcg by mouth daily.    [provider]    Family History  Problem Relation Age of Onset   Diabetes Mother    Cancer Mother    Heart disease Father    Throat cancer Sister    Heart disease Brother    Diabetes Brother    Cancer Sister    Heart disease Brother  Heart disease Brother      Social History   Tobacco Use   Smoking status: Former    Packs/day: 0.25    Years: 2.00    Total pack years: 0.50    Types: Cigarettes    Quit date: 01/02/1952    Years since quitting: 70.9   Smokeless tobacco: Never   Tobacco comments:    smoking cessation materials not required  Vaping Use   Vaping Use: Never used  Substance Use Topics   Alcohol use: No   Drug use: No    Allergies as of 12/08/2022 - Review Complete 12/08/2022  Allergen Reaction Noted   Ciprofloxacin Nausea And Vomiting 03/13/2017   Gluten meal Other (See Comments) 05/02/2016   Pioglitazone Other (See Comments) 05/02/2016   Gabapentin Diarrhea, Nausea Only, and Other (See Comments) 11/14/2016    Review of Systems:    All systems reviewed  and negative except where noted in HPI.   Physical Exam:  Vital signs in last 24 hours: Temp:  [98.2 F (36.8 C)-99 F (37.2 C)] 98.2 F (36.8 C) (01/21 0752) Pulse Rate:  [58-69] 58 (01/21 0752) Resp:  [18-20] 18 (01/21 0752) BP: (146-222)/(50-89) 179/65 (01/21 0752) SpO2:  [95 %-100 %] 99 % (01/21 0752) Weight:  [86.6 kg] 86.6 kg (01/20 2259) Last BM Date : 12/07/22 General:   Pleasant, cooperative in NAD Head:  Normocephalic and atraumatic. Eyes:   No icterus.   Conjunctiva pink. PERRLA. Ears:  Normal auditory acuity. Neck:  Supple; no masses or thyroidomegaly Lungs: Respirations even and unlabored. Lungs clear to auscultation bilaterally.   No wheezes, crackles, or rhonchi.  Heart:  Regular rate and rhythm;  Without murmur, clicks, rubs or gallops Abdomen:  Soft, nondistended, nontender. Normal bowel sounds. No appreciable masses or hepatomegaly.  No rebound or guarding.  Neurologic:  Alert and oriented x3;  grossly normal neurologically. Skin:  Intact without significant lesions or rashes. Cervical Nodes:  No significant cervical adenopathy. Psych:  Alert and cooperative. Normal affect.  LAB RESULTS: Recent Labs    12/08/22 2310  WBC 5.2  HGB 10.4*  HCT 35.1*  PLT 163   BMET Recent Labs    12/08/22 2310  NA 138  K 3.7  CL 111  CO2 19*  GLUCOSE 163*  BUN 20  CREATININE 1.38*  CALCIUM 9.1   LFT Recent Labs    12/08/22 2310  PROT 6.2*  ALBUMIN 3.7  AST 17  ALT 9  ALKPHOS 58  BILITOT 0.8   PT/INR No results for input(s): "LABPROT", "INR" in the last 72 hours.  STUDIES: CT ABDOMEN PELVIS W CONTRAST  Result Date: 12/09/2022 CLINICAL DATA:  Abdominal pain, acute, nonlocalized, diarrhea EXAM: CT ABDOMEN AND PELVIS WITH CONTRAST TECHNIQUE: Multidetector CT imaging of the abdomen and pelvis was performed using the standard protocol following bolus administration of intravenous contrast. RADIATION DOSE REDUCTION: This exam was performed according to the  departmental dose-optimization program which includes automated exposure control, adjustment of the mA and/or kV according to patient size and/or use of iterative reconstruction technique. CONTRAST:  20m OMNIPAQUE IOHEXOL 300 MG/ML  SOLN COMPARISON:  05/11/2008 FINDINGS: Lower chest: Extensive multi-vessel coronary artery calcification. Mild cardiomegaly. Central pulmonary arteries are enlarged in keeping with changes of pulmonary arterial hypertension. Visualized lung bases are clear. Small hiatal hernia. Hepatobiliary: Cholelithiasis without pericholecystic inflammatory change. Liver unremarkable. No intra or extrahepatic biliary ductal dilation. Pancreas: Unremarkable Spleen: Stable probable cyst or hemangioma within the splenic hilum. Scattered additional hypodensities within the spleen  may relate to the phase of contrast enhancement. Normal size. Adrenals/Urinary Tract: The adrenal glands are unremarkable. Kidneys are normal in position. Mild bilateral renal cortical atrophy. No enhancing intrarenal masses. No hydronephrosis. No intrarenal or ureteral urinary calculi. Scattered calcifications within the right renal hilum likely represent vascular calcifications. The bladder is unremarkable. Stomach/Bowel: There is focal narrowing of the mid transverse colon best seen on axial image # 50/2 and coronal image # 18/5 which appears new since prior examination and may represent a focal colonic stricture or apple-core mass. There is moderate stool proximal to this, possibly representing a partial large bowel obstruction. The stomach, small bowel, and large bowel are otherwise unremarkable. Appendix normal. No free intraperitoneal gas or fluid. Vascular/Lymphatic: Moderate aortoiliac atherosclerotic calcification. No aortic aneurysm. Extensive visceral arteriosclerosis with probable hemodynamically significant stenosis of the proximal and mid superior mesenteric artery as well as the renal arteries bilaterally. No  pathologic adenopathy within the abdomen and pelvis. Reproductive: Status post hysterectomy. No adnexal masses. Other: Mild nonspecific subcutaneous body wall edema dependently and within the pannus. No abdominal wall hernia. Musculoskeletal: No acute bone abnormality. Degenerative changes are seen within the thoracolumbar spine. No lytic or blastic bone lesion. IMPRESSION: 1. Extensive multi-vessel coronary artery calcification. Mild cardiomegaly. Morphologic changes in keeping with pulmonary arterial hypertension. 2. Cholelithiasis. 3. Focal narrowing of the mid transverse colon, new since prior examination, possibly representing a focal colonic stricture or apple-core mass. Correlation with colonoscopy may be helpful for further evaluation. Moderate stool proximal to this, possibly representing a resultant partial large bowel obstruction. 4. Extensive visceral arteriosclerosis with probable hemodynamically significant stenosis of the proximal and mid superior mesenteric artery as well as the renal arteries bilaterally. If there is clinical evidence of chronic mesenteric ischemia or hemodynamically significant renal artery stenosis, CT arteriography may be helpful for further evaluation. Aortic Atherosclerosis (ICD10-I70.0). Electronically Signed   By: Fidela Salisbury M.D.   On: 12/09/2022 02:36      Impression / Plan:   Abigayl Hor is a 89 y.o. y/o female presented to the emergency room with abdominal pain and CT scan concerning for an apple core lesion in the transverse colon with partial possible obstruction.  I have been called to evaluate this further.  Plan 1.  Would recommend slowly starting the process of drinking bowel prep with serial abdominal examination.  If at any point there is concern that she is obstructed then to stop the bowel prep, then  placing NG tube and vent the contents of the stomach.  I would also recommend a enema to disimpact distally.  Hopefully the bowel prep can clean  out  the colon slowly and we can plan for colonoscopy in 2-3 days time depending on her ability to tolerate the prep and cleanout.  If she develops features of bowel obstruction such as abdominal distention pain vomiting then we cannot do a colonoscopy and only option would be to do surgery if we cannot prep her. Clinically she is not obstructed presently , abdomen is non distended, soft , bowel sounds are present.   Thank you for involving me in the care of this patient.      LOS: 0 days   Heather Bellows, MD  12/09/2022, 8:44 AM

## 2022-12-09 NOTE — Plan of Care (Signed)

## 2022-12-09 NOTE — Assessment & Plan Note (Addendum)
Continue sliding scale insulin coverage

## 2022-12-10 DIAGNOSIS — I160 Hypertensive urgency: Secondary | ICD-10-CM | POA: Diagnosis not present

## 2022-12-10 DIAGNOSIS — K6389 Other specified diseases of intestine: Secondary | ICD-10-CM | POA: Diagnosis not present

## 2022-12-10 DIAGNOSIS — K56609 Unspecified intestinal obstruction, unspecified as to partial versus complete obstruction: Secondary | ICD-10-CM | POA: Diagnosis not present

## 2022-12-10 DIAGNOSIS — N1832 Chronic kidney disease, stage 3b: Secondary | ICD-10-CM | POA: Diagnosis not present

## 2022-12-10 LAB — GLUCOSE, CAPILLARY
Glucose-Capillary: 104 mg/dL — ABNORMAL HIGH (ref 70–99)
Glucose-Capillary: 119 mg/dL — ABNORMAL HIGH (ref 70–99)
Glucose-Capillary: 126 mg/dL — ABNORMAL HIGH (ref 70–99)
Glucose-Capillary: 136 mg/dL — ABNORMAL HIGH (ref 70–99)
Glucose-Capillary: 163 mg/dL — ABNORMAL HIGH (ref 70–99)
Glucose-Capillary: 189 mg/dL — ABNORMAL HIGH (ref 70–99)

## 2022-12-10 MED ORDER — HEPARIN SODIUM (PORCINE) 5000 UNIT/ML IJ SOLN
5000.0000 [IU] | Freq: Three times a day (TID) | INTRAMUSCULAR | Status: DC
  Administered 2022-12-10 – 2022-12-12 (×6): 5000 [IU] via SUBCUTANEOUS
  Filled 2022-12-10 (×5): qty 1

## 2022-12-10 MED ORDER — SODIUM CHLORIDE 0.9 % IV SOLN: INTRAVENOUS | Status: DC

## 2022-12-10 MED ORDER — POLYETHYLENE GLYCOL 3350 17 GM/SCOOP PO POWD
1.0000 | Freq: Once | ORAL | Status: AC
  Administered 2022-12-10: 255 g via ORAL
  Filled 2022-12-10: qty 255

## 2022-12-10 NOTE — TOC CM/SW Note (Signed)
Patient is a resident at [facility]. She receives home PT and OT through [facility]. They will need home health orders at discharge.  Dayton Scrape, [place]

## 2022-12-10 NOTE — Hospital Course (Addendum)
89 y.o. female with medical history significant for HTN, DM, CKD 3B, chronic back pain admitted for severe constipation and possible narrowing/stricture/apple core mass of the mid transverse colon seen on CT scan Seen by GI, colonoscopy was performed, showed a large colon mass with obstruction.  Patient is eval by general surgery, patient does not need emergent colectomy, but the patient has not decided that she will want colectomy in the future.  General surgery has scheduled the patient to be followed in the office on 2/14. Patient will also will be followed by oncology in office if cannot see today. Discussed with Dr. Dahlia Byes, currently, patient does not seem to have any evidence of bowel obstruction.  Will continue stool softeners at home.  Follow-up with PCP, oncology and general surgery as outpatient.

## 2022-12-10 NOTE — Progress Notes (Signed)
  Progress Note   Patient: Heather Long GNF:[id number] DOB: 26-Dec-1927 DOA: 12/09/2022     1 DOS: the patient was seen and examined on 12/10/2022   Brief hospital course: 89 y.o. female with medical history significant for HTN, DM, CKD 3B, chronic back pain admitted for severe constipation and possible narrowing/stricture/apple core mass of the mid transverse colon seen on CT scan  1/21: GI consult 1/22: Colonoscopy prep for possible colonoscopy next 1 to 2 days  Assessment and Plan: Abdominal pain Partial large bowel obstruction Secondary to possible colonic mass as well as visceral arteriosclerosis She had a large bowel movement this morning and has been having more since she started drinking colonoscopy prep last evening.  Bowel obstruction resolved.  Abdominal pain has been resolved also  Hypertensive urgency Continue home losartan Hydralazine IV as needed for additional BP control Also added IV Lopressor for better blood pressure control  Colonic mass GI seen.  Planning for colonoscopy  Anemia Stable.  Likely of chronic disease  Stage 3b chronic kidney disease ([facility] [place]) Renal function at baseline.  Monitor and avoid nephrotoxic medications  Lab Results  Component Value Date   CREATININE 1.38 (H) 12/08/2022   CREATININE 1.26 (H) 08/18/2018   CREATININE 1.20 (H) 04/15/2018     Type II diabetes mellitus (HCC) Continue sliding scale insulin coverage   Will consult palliative care for goals of care conversation     Subjective: Patient seems quite frustrated as her hearing aid charger was not with her and she could not hear very well and had minimal hearing in her left ear without having hearing aid.  She also has difficulty with vision so was very difficult to communicate with her.  She denies any abdominal pain and shared that she has had several bowel movement since started drinking prep last night.  Physical Exam: Vitals:   12/09/22 2013 12/10/22 0329 12/10/22 0821  12/10/22 1521  BP: (!) 133/56 (!) 130/46 (!) 156/50 (!) 144/56  Pulse: 80 66 69 63  Resp: '18 18 20 16  '$ Temp: 98.3 F (36.8 C) 99.6 F (37.6 C) 98.9 F (37.2 C) 98.2 F (36.8 C)  TempSrc:  Oral Oral Oral  SpO2: 96%  97% 95%  Weight:      Height:       89 year old female sitting in the bed somewhat frustrated due to not able to hear or see well Lungs clear to auscultation bilaterally Cardiovascular regular rate and rhythm Abdomen soft, obese, benign Neuro alert and awake, nonfocal Skin no rash or lesion Data Reviewed:  Hemoglobin 10.4  Family Communication: None  Disposition: Status is: Inpatient Remains inpatient appropriate because: Waiting for colonoscopy  Planned Discharge Destination: Home with Home Health   DVT prophylaxis-heparin Time spent: 35 minutes  Author: Max Sane, MD 12/10/2022 4:01 PM  For on call review [email or web address].

## 2022-12-10 NOTE — Assessment & Plan Note (Signed)
GI seen.  Planning for colonoscopy

## 2022-12-10 NOTE — Progress Notes (Signed)
Heather Darby, MD [address]  [place]  [place], [place] [address]  Main: [phone]  Fax: [phone] Pager: [phone]   [name] is a 89 year old female admitted with partial large bowel obstruction  Subjective: Patient had a large brown bowel movement and has been having loose bowel movements since then.  She has been tolerating liquid diet well.  She denies any nausea or vomiting, abdominal pain.  She reports significant relief of her abdominal discomfort.  She lives in nursing home, reports that she has been experiencing new onset of constipation started about 3 months ago.   Objective: Vital signs in last 24 hours: Vitals:   12/10/22 0329 12/10/22 0821 12/10/22 1521 12/10/22 2005  BP: (!) 130/46 (!) 156/50 (!) 144/56 (!) 132/46  Pulse: 66 69 63 (!) 58  Resp: '18 20 16 16  '$ Temp: 99.6 F (37.6 C) 98.9 F (37.2 C) 98.2 F (36.8 C) 98.4 F (36.9 C)  TempSrc: Oral Oral Oral Oral  SpO2:  97% 95% 98%  Weight:      Height:       Weight change:  No intake or output data in the 24 hours ending 12/10/22 2308   Exam: Heart:: Regular rate and rhythm Lungs: normal Abdomen: soft, nontender, normal bowel sounds   Lab Results:    Latest Ref Rng & Units 12/08/2022   11:10 PM 08/18/2018   10:49 AM 12/17/2016    3:32 PM  CBC  WBC 4.0 - 10.5 K/uL 5.2  5.0  5.4   Hemoglobin 12.0 - 15.0 g/dL 10.4  12.7  12.9   Hematocrit 36.0 - 46.0 % 35.1  38.3  39.3   Platelets 150 - 400 K/uL 163  173  171       Latest Ref Rng & Units 12/08/2022   11:10 PM 08/18/2018   10:49 AM 04/15/2018    9:54 AM  CMP  Glucose 70 - 99 mg/dL 163  158  218   BUN 8 - 23 mg/dL '20  26  27   '$ Creatinine 0.44 - 1.00 mg/dL 1.38  1.26  1.20   Sodium 135 - 145 mmol/L 138  142  145   Potassium 3.5 - 5.1 mmol/L 3.7  5.0  4.2   Chloride 98 - 111 mmol/L 111  107  110   CO2 22 - 32 mmol/L '19  19  20   '$ Calcium 8.9 - 10.3 mg/dL 9.1  9.5  9.4   Total Protein 6.5 - 8.1 g/dL 6.2   6.0    Total Bilirubin 0.3 - 1.2 mg/dL 0.8   0.3   Alkaline Phos 38 - 126 U/L 58   61   AST 15 - 41 U/L 17   16   ALT 0 - 44 U/L 9   12     Micro Results: No results found for this or any previous visit (from the past 240 hour(s)). Studies/Results: CT ABDOMEN PELVIS W CONTRAST  Result Date: 12/09/2022 CLINICAL DATA:  Abdominal pain, acute, nonlocalized, diarrhea EXAM: CT ABDOMEN AND PELVIS WITH CONTRAST TECHNIQUE: Multidetector CT imaging of the abdomen and pelvis was performed using the standard protocol following bolus administration of intravenous contrast. RADIATION DOSE REDUCTION: This exam was performed according to the departmental dose-optimization program which includes automated exposure control, adjustment of the mA and/or kV according to patient size and/or use of iterative reconstruction technique. CONTRAST:  101m OMNIPAQUE IOHEXOL 300 MG/ML  SOLN COMPARISON:  05/11/2008 FINDINGS: Lower chest: Extensive multi-vessel  coronary artery calcification. Mild cardiomegaly. Central pulmonary arteries are enlarged in keeping with changes of pulmonary arterial hypertension. Visualized lung bases are clear. Small hiatal hernia. Hepatobiliary: Cholelithiasis without pericholecystic inflammatory change. Liver unremarkable. No intra or extrahepatic biliary ductal dilation. Pancreas: Unremarkable Spleen: Stable probable cyst or hemangioma within the splenic hilum. Scattered additional hypodensities within the spleen may relate to the phase of contrast enhancement. Normal size. Adrenals/Urinary Tract: The adrenal glands are unremarkable. Kidneys are normal in position. Mild bilateral renal cortical atrophy. No enhancing intrarenal masses. No hydronephrosis. No intrarenal or ureteral urinary calculi. Scattered calcifications within the right renal hilum likely represent vascular calcifications. The bladder is unremarkable. Stomach/Bowel: There is focal narrowing of the mid transverse colon best seen on axial image  # 50/2 and coronal image # 18/5 which appears new since prior examination and may represent a focal colonic stricture or apple-core mass. There is moderate stool proximal to this, possibly representing a partial large bowel obstruction. The stomach, small bowel, and large bowel are otherwise unremarkable. Appendix normal. No free intraperitoneal gas or fluid. Vascular/Lymphatic: Moderate aortoiliac atherosclerotic calcification. No aortic aneurysm. Extensive visceral arteriosclerosis with probable hemodynamically significant stenosis of the proximal and mid superior mesenteric artery as well as the renal arteries bilaterally. No pathologic adenopathy within the abdomen and pelvis. Reproductive: Status post hysterectomy. No adnexal masses. Other: Mild nonspecific subcutaneous body wall edema dependently and within the pannus. No abdominal wall hernia. Musculoskeletal: No acute bone abnormality. Degenerative changes are seen within the thoracolumbar spine. No lytic or blastic bone lesion. IMPRESSION: 1. Extensive multi-vessel coronary artery calcification. Mild cardiomegaly. Morphologic changes in keeping with pulmonary arterial hypertension. 2. Cholelithiasis. 3. Focal narrowing of the mid transverse colon, new since prior examination, possibly representing a focal colonic stricture or apple-core mass. Correlation with colonoscopy may be helpful for further evaluation. Moderate stool proximal to this, possibly representing a resultant partial large bowel obstruction. 4. Extensive visceral arteriosclerosis with probable hemodynamically significant stenosis of the proximal and mid superior mesenteric artery as well as the renal arteries bilaterally. If there is clinical evidence of chronic mesenteric ischemia or hemodynamically significant renal artery stenosis, CT arteriography may be helpful for further evaluation. Aortic Atherosclerosis (ICD10-I70.0). Electronically Signed   By: Heather Long M.D.   On: 12/09/2022  02:36   Medications: I have reviewed the patient's current medications. Prior to Admission:  Medications Prior to Admission  Medication Sig Dispense Refill Last Dose   bismuth subsalicylate (PEPTO BISMOL) 262 MG chewable tablet Chew 524 mg by mouth as needed. (Patient not taking: Reported on 12/10/2022)   Not Taking   Cholecalciferol (VITAMIN D3) 5000 units CAPS Take 50,000 Units by mouth daily. Daily  (Patient not taking: Reported on 12/10/2022)   Not Taking   dicyclomine (BENTYL) 10 MG capsule Take 10 mg by mouth every 12 (twelve) hours as needed for spasms. (Patient not taking: Reported on 12/10/2022)   Not Taking   escitalopram (LEXAPRO) 5 MG tablet Take 5 mg by mouth daily. (Patient not taking: Reported on 12/10/2022)   Not Taking   glipiZIDE (GLUCOTROL XL) 2.5 MG 24 hr tablet Take 2.5 mg by mouth daily with breakfast. (Patient not taking: Reported on 12/10/2022)   Not Taking   losartan (COZAAR) 100 MG tablet Take 100 mg by mouth daily.  (Patient not taking: Reported on 12/10/2022)   Not Taking   Multiple Vitamins-Minerals (PRESERVISION AREDS PO) Take by mouth.      pantoprazole (PROTONIX) 40 MG tablet Take 40 mg by mouth  daily. (Patient not taking: Reported on 12/10/2022)   Not Taking   vitamin B-12 (CYANOCOBALAMIN) 500 MCG tablet Take 1,500 mcg by mouth daily. (Patient not taking: Reported on 12/10/2022)   Not Taking   Scheduled:  escitalopram  5 mg Oral Daily   heparin injection (subcutaneous)  5,000 Units Subcutaneous Q8H   insulin aspart  0-20 Units Subcutaneous Q4H   losartan  100 mg Oral Daily   metoprolol tartrate  2.5 mg Intravenous Q12H   pantoprazole  40 mg Oral Daily   Continuous:  sodium chloride 10 mL/hr at 12/10/22 2113   [START ON 12/11/2022] sodium chloride     MAY:[id number] **OR** acetaminophen, hydrALAZINE, HYDROcodone-acetaminophen, morphine injection, ondansetron **OR** ondansetron (ZOFRAN) IV Anti-infectives (From admission, onward)    None      Scheduled  Meds:  escitalopram  5 mg Oral Daily   heparin injection (subcutaneous)  5,000 Units Subcutaneous Q8H   insulin aspart  0-20 Units Subcutaneous Q4H   losartan  100 mg Oral Daily   metoprolol tartrate  2.5 mg Intravenous Q12H   pantoprazole  40 mg Oral Daily   Continuous Infusions:  sodium chloride 10 mL/hr at 12/10/22 2113   [START ON 12/11/2022] sodium chloride     PRN Meds:.acetaminophen **OR** acetaminophen, hydrALAZINE, HYDROcodone-acetaminophen, morphine injection, ondansetron **OR** ondansetron (ZOFRAN) IV   Assessment: Principal Problem:   Large bowel obstruction (HCC) Active Problems:   Type II diabetes mellitus (HCC)   Stage 3b chronic kidney disease ([place])   Anemia   Abdominal pain   Hypertensive urgency   Colonic mass  Partial large bowel obstruction CT scan revealed focal narrowing of the mid transverse colon which is new, possibly representing focal colonic stricture apple core mass Patient had a large bowel movement, responded to bowel prep, large bowel obstruction is currently relieved and patient is having bowel movements without any nausea or vomiting, abdominal pain or distention  Plan: Discussed with patient regarding further prep to undergo colonoscopy to figure out if she has any malignancy or benign stricture Discussed with her about options of various bowel preps including Nulytely or GoLytely or MiraLAX Gatorade prep.  She is willing to try MiraLAX Gatorade prep Continue clear liquid diet N.p.o. effective 5 AM tomorrow Tentative plan to perform colonoscopy tomorrow if patient has undergone adequate bowel prep  I have discussed alternative options, risks & benefits,  which include, but are not limited to, bleeding, infection, perforation,respiratory complication & drug reaction.  The patient agrees with this plan & written consent will be obtained.      LOS: 1 day   Heather Long 12/10/2022, 11:08 PM

## 2022-12-11 ENCOUNTER — Encounter
Admission: EM | Disposition: A | Discharge status: Home Health | Payer: Self-pay | Source: Home / Self Care | Attending: Internal Medicine

## 2022-12-11 ENCOUNTER — Inpatient Hospital Stay: Payer: 59 | Admitting: Certified Registered"

## 2022-12-11 ENCOUNTER — Encounter: Payer: Self-pay | Admitting: Internal Medicine

## 2022-12-11 DIAGNOSIS — I160 Hypertensive urgency: Secondary | ICD-10-CM | POA: Diagnosis not present

## 2022-12-11 DIAGNOSIS — R109 Unspecified abdominal pain: Secondary | ICD-10-CM

## 2022-12-11 DIAGNOSIS — N1832 Chronic kidney disease, stage 3b: Secondary | ICD-10-CM | POA: Diagnosis not present

## 2022-12-11 DIAGNOSIS — K6389 Other specified diseases of intestine: Secondary | ICD-10-CM | POA: Diagnosis not present

## 2022-12-11 HISTORY — PX: COLONOSCOPY WITH PROPOFOL: SHX5780

## 2022-12-11 LAB — GLUCOSE, CAPILLARY
Glucose-Capillary: 172 mg/dL — ABNORMAL HIGH (ref 70–99)
Glucose-Capillary: 101 mg/dL — ABNORMAL HIGH (ref 70–99)
Glucose-Capillary: 129 mg/dL — ABNORMAL HIGH (ref 70–99)
Glucose-Capillary: 83 mg/dL (ref 70–99)
Glucose-Capillary: 183 mg/dL — ABNORMAL HIGH (ref 70–99)
Glucose-Capillary: 159 mg/dL — ABNORMAL HIGH (ref 70–99)

## 2022-12-11 LAB — CBC
HCT: 30.2 % — ABNORMAL LOW (ref 36.0–46.0)
Hemoglobin: 9.5 g/dL — ABNORMAL LOW (ref 12.0–15.0)
MCH: 26.5 pg (ref 26.0–34.0)
MCHC: 31.5 g/dL (ref 30.0–36.0)
MCV: 84.4 fL (ref 80.0–100.0)
Platelets: 151 10*3/uL (ref 150–400)
RBC: 3.58 MIL/uL — ABNORMAL LOW (ref 3.87–5.11)
RDW: 14.5 % (ref 11.5–15.5)
WBC: 4.2 10*3/uL (ref 4.0–10.5)
nRBC: 0 % (ref 0.0–0.2)

## 2022-12-11 LAB — BASIC METABOLIC PANEL
Anion gap: 7 (ref 5–15)
BUN: 15 mg/dL (ref 8–23)
CO2: 18 mmol/L — ABNORMAL LOW (ref 22–32)
Calcium: 8.3 mg/dL — ABNORMAL LOW (ref 8.9–10.3)
Chloride: 114 mmol/L — ABNORMAL HIGH (ref 98–111)
Creatinine, Ser: 1.46 mg/dL — ABNORMAL HIGH (ref 0.44–1.00)
GFR, Estimated: 34 mL/min — ABNORMAL LOW (ref >60)
Glucose, Bld: 181 mg/dL — ABNORMAL HIGH (ref 70–99)
Potassium: 3.6 mmol/L (ref 3.5–5.1)
Sodium: 139 mmol/L (ref 135–145)

## 2022-12-11 SURGERY — COLONOSCOPY WITH PROPOFOL
Anesthesia: General

## 2022-12-11 MED ORDER — EPHEDRINE SULFATE (PRESSORS) 50 MG/ML IJ SOLN
INTRAMUSCULAR | Status: DC | PRN
  Administered 2022-12-11: 10 mg via INTRAVENOUS

## 2022-12-11 MED ORDER — PROPOFOL 500 MG/50ML IV EMUL
INTRAVENOUS | Status: DC | PRN
  Administered 2022-12-11: 200 ug/kg/min via INTRAVENOUS

## 2022-12-11 MED ORDER — SPOT INK MARKER SYRINGE KIT
PACK | SUBMUCOSAL | Status: DC | PRN
  Administered 2022-12-11: 5 mL via SUBMUCOSAL

## 2022-12-11 MED ORDER — SODIUM CHLORIDE 0.9 % IV SOLN: INTRAVENOUS | Status: DC

## 2022-12-11 MED ORDER — PROPOFOL 10 MG/ML IV BOLUS
INTRAVENOUS | Status: DC | PRN
  Administered 2022-12-11: 40 mg via INTRAVENOUS

## 2022-12-11 MED ORDER — HYDRALAZINE HCL 20 MG/ML IJ SOLN
5.0000 mg | Freq: Four times a day (QID) | INTRAMUSCULAR | Status: DC
  Administered 2022-12-11 – 2022-12-12 (×3): 5 mg via INTRAVENOUS
  Filled 2022-12-11 (×3): qty 1

## 2022-12-11 MED ORDER — PHENYLEPHRINE HCL (PRESSORS) 10 MG/ML IV SOLN
INTRAVENOUS | Status: DC | PRN
  Administered 2022-12-11: 80 ug via INTRAVENOUS

## 2022-12-11 NOTE — Assessment & Plan Note (Addendum)
GI seen.  Planning for colonoscopy later today.  Palliative care consult pending

## 2022-12-11 NOTE — Anesthesia Procedure Notes (Signed)
Date/Time: 12/11/2022 5:03 PM  Performed by: Nelda Marseille, CRNAPre-anesthesia Checklist: Patient identified, Emergency Drugs available, Suction available and Patient being monitored Oxygen Delivery Method: Nasal cannula

## 2022-12-11 NOTE — Op Note (Signed)
[facility] Patient Name: Heather Long Procedure Date: 12/11/2022 4:47 PM MRN: [id number] Account #: [id number] Date of Birth: Jan 28, 1928 Admit Type: Inpatient Age: 89 Room: [facility] ENDO ROOM 1 Gender: Female Note Status: Finalized Instrument Name: Peds Colonoscope [id number] Procedure:             Colonoscopy Indications:           Generalized abdominal pain, Abnormal CT of the GI                         tract, Colon mass, Constipation Providers:             Lin Landsman MD, MD Referring MD:          Juluis Rainier (Referring MD) Medicines:             General Anesthesia Complications:         No immediate complications. Estimated blood loss: None. Procedure:             Pre-Anesthesia Assessment:                        - Prior to the procedure, a History and Physical was                         performed, and patient medications and allergies were                         reviewed. The patient is competent. The risks and                         benefits of the procedure and the sedation options and                         risks were discussed with the patient. All questions                         were answered and informed consent was obtained.                         Patient identification and proposed procedure were                         verified by the physician, the nurse, the                         anesthesiologist, the anesthetist and the technician                         in the pre-procedure area in the procedure room in the                         endoscopy suite. Mental Status Examination: alert and                         oriented. Airway Examination: normal oropharyngeal                         airway and neck mobility. Respiratory Examination:  clear to auscultation. CV Examination: normal.                         Prophylactic Antibiotics: The patient does not require                          prophylactic antibiotics. Prior Anticoagulants: The                         patient has taken no anticoagulant or antiplatelet                         agents. ASA Grade Assessment: III - A patient with                         severe systemic disease. After reviewing the risks and                         benefits, the patient was deemed in satisfactory                         condition to undergo the procedure. The anesthesia                         plan was to use general anesthesia. Immediately prior                         to administration of medications, the patient was                         re-assessed for adequacy to receive sedatives. The                         heart rate, respiratory rate, oxygen saturations,                         blood pressure, adequacy of pulmonary ventilation, and                         response to care were monitored throughout the                         procedure. The physical status of the patient was                         re-assessed after the procedure.                        After obtaining informed consent, the colonoscope was                         passed under direct vision. Throughout the procedure,                         the patient's blood pressure, pulse, and oxygen                         saturations were monitored continuously. The  colonoscopy was performed without difficulty. The                         patient tolerated the procedure well. The quality of                         the bowel preparation was evaluated using the BBPS                         [facility] Bowel Preparation Scale) with scores of: Right                         Colon = 3, Transverse Colon = 3 and Left Colon = 3                         (entire mucosa seen well with no residual staining,                         small fragments of stool or opaque liquid). The total                         BBPS score equals 9. The Colonoscope was introduced                          through the anus and advanced to the the transverse                         colon to examine a mass. This was the intended extent. Findings:      The perianal and digital rectal examinations were normal. Pertinent       negatives include normal sphincter tone and no palpable rectal lesions.      A fungating and ulcerated partially obstructing large mass was found at       65 cm proximal to the anus. The mass was circumferential. No bleeding       was present. Biopsies were taken with a cold forceps for histology.       Estimated blood loss: none. Area was tattooed with an injection of Spot       (carbon black).      Three sessile polyps were found in the distal transverse colon. The       polyps were 4 to 9 mm in size. These polyps were removed with a hot       snare. Resection and retrieval were complete. Estimated blood loss: none.      A 5 mm polyp was found in the descending colon. The polyp was sessile.       The polyp was removed with a cold snare. Resection and retrieval were       complete. Estimated blood loss: none.      The retroflexed view of the distal rectum and anal verge was normal and       showed no anal or rectal abnormalities. Impression:            - Malignant partially obstructing tumor at 65 cm                         proximal to the anus. Biopsied. Tattooed.                        -  Three 4 to 9 mm polyps in the distal transverse                         colon, removed with a hot snare. Resected and                         retrieved.                        - One 5 mm polyp in the descending colon, removed with                         a cold snare. Resected and retrieved.                        - The distal rectum and anal verge are normal on                         retroflexion view. Recommendation:        - Return patient to hospital ward for ongoing care.                        - Full liquid diet today.                        - Consult  oncologist.                        - Consult surgeon. Procedure Code(s):     --- Professional ---                        [phone], 52, Colonoscopy, flexible; with removal of                         tumor(s), polyp(s), or other lesion(s) by snare                         technique                        45380, 59,52, Colonoscopy, flexible; with biopsy,                         single or multiple                        45381, 52, Colonoscopy, flexible; with directed                         submucosal injection(s), any substance Diagnosis Code(s):     --- Professional ---                        C18.9, Malignant neoplasm of colon, unspecified                        K56.690, Other partial intestinal obstruction                        D12.3, Benign neoplasm of transverse colon (hepatic  flexure or splenic flexure)                        D12.4, Benign neoplasm of descending colon                        R10.84, Generalized abdominal pain                        K63.89, Other specified diseases of intestine                        K59.00, Constipation, unspecified                        R93.3, Abnormal findings on diagnostic imaging of                         other parts of digestive tract CPT copyright 2022 American Medical Association. All rights reserved. The codes documented in this report are preliminary and upon coder review may  be revised to meet current compliance requirements. Dr. Ulyess Mort Lin Landsman MD, MD 12/11/2022 5:25:53 PM This report has been signed electronically. Number of Addenda: 0 Note Initiated On: 12/11/2022 4:47 PM Scope Withdrawal Time: 0 hours 14 minutes 50 seconds  Total Procedure Duration: 0 hours 18 minutes 16 seconds  Estimated Blood Loss:  Estimated blood loss: none.      [facility]

## 2022-12-11 NOTE — Assessment & Plan Note (Addendum)
Continue sliding scale insulin coverage.  Hemoglobin A1c 7.1

## 2022-12-11 NOTE — Assessment & Plan Note (Signed)
Stable.  Likely of chronic disease.

## 2022-12-11 NOTE — Progress Notes (Signed)
Transported to colonoscopy

## 2022-12-11 NOTE — Assessment & Plan Note (Signed)
Secondary to possible colonic mass as well as visceral arteriosclerosis She had a large bowel movement yesterday morning and has been having more since she started drinking colonoscopy prep last evening.  Bowel obstruction resolved.  Abdominal pain has been resolved also

## 2022-12-11 NOTE — TOC Progression Note (Signed)
Transition of Care [facility]) - Progression Note    Patient Details  Name: Heather Long MRN: [id number] Date of Birth: Nov 19, 1928  [facility]) CM/SW Contact  Beverly Sessions, RN Phone Number: 12/11/2022, 3:42 PM  Clinical Narrative:     Message sent to [employer] at [facility] x2 to determine  if patient has life at home services in place to assist with medication administration   PT OT eval pending     Expected Discharge Plan and Services                                               Social Determinants of Health (SDOH) Interventions SDOH Screenings   Food Insecurity: No Food Insecurity (12/09/2022)  Housing: Low Risk  (12/09/2022)  Transportation Needs: No Transportation Needs (12/09/2022)  Utilities: Not At Risk (12/09/2022)  Financial Resource Strain: Low Risk  (12/09/2017)  Physical Activity: Inactive (12/09/2017)  Social Connections: Unknown (12/09/2017)  Stress: No Stress Concern Present (12/09/2017)  Tobacco Use: Medium Risk (12/08/2022)    Readmission Risk Interventions     No data to display

## 2022-12-11 NOTE — Progress Notes (Signed)
  Progress Note   Patient: Heather Long PZW:[id number] DOB: Apr 27, 1928 DOA: 12/09/2022     2 DOS: the patient was seen and examined on 12/11/2022   Brief hospital course: 89 y.o. female with medical history significant for HTN, DM, CKD 3B, chronic back pain admitted for severe constipation and possible narrowing/stricture/apple core mass of the mid transverse colon seen on CT scan  1/21: GI consult 1/22: Colonoscopy prep for possible colonoscopy tomorrow 1/23: Colonoscopy later today   Assessment and Plan: Abdominal pain Secondary to possible colonic mass as well as visceral arteriosclerosis She had a large bowel movement yesterday morning and has been having more since she started drinking colonoscopy prep last evening.  Bowel obstruction resolved.  Abdominal pain has been resolved also  Hypertensive urgency Stopping losartan as it function has worsened Hydralazine IV as needed for additional BP control Also added IV hydralazine and continue IV Lopressor for better blood pressure control  Colonic mass GI seen.  Planning for colonoscopy later today.  Palliative care consult pending  Anemia Stable.  Likely of chronic disease.  Stage 3b chronic kidney disease ([place]) Renal function somewhat worsened.  Will start gentle hydration.  Will discontinue losartan with worsening renal function  Lab Results  Component Value Date   CREATININE 1.46 (H) 12/11/2022   CREATININE 1.38 (H) 12/08/2022   CREATININE 1.26 (H) 08/18/2018     Type II diabetes mellitus (HCC) Continue sliding scale insulin coverage.  Hemoglobin A1c 7.1        Subjective: Gets frustrated easily due to difficulty with vision and hearing.  Sitting in the chair agreeable to get colonoscopy done  Physical Exam: Vitals:   12/10/22 1521 12/10/22 2005 12/11/22 0442 12/11/22 0823  BP: (!) 144/56 (!) 132/46 (!) 147/52 (!) 170/59  Pulse: 63 (!) 58 (!) 57 65  Resp: '16 16 16 18  '$ Temp: 98.2 F (36.8 C) 98.4 F (36.9  C) 98.2 F (36.8 C) (!) 97.3 F (36.3 C)  TempSrc: Oral Oral Oral Oral  SpO2: 95% 98% 97% 98%  Weight:      Height:       89 year old female sitting in the bed somewhat frustrated due to not able to hear or see well Lungs clear to auscultation bilaterally Cardiovascular regular rate and rhythm Abdomen soft, obese, benign Neuro alert and awake, nonfocal Skin no rash or lesion Data Reviewed:  Creatinine 1.46  Family Communication: None at bedside  Disposition: Status is: Inpatient Remains inpatient appropriate because: Getting colonoscopy today  Planned Discharge Destination: Home with Home Health   DVT prophylaxis-heparin Time spent: 35 minutes  Author: Max Sane, MD 12/11/2022 3:37 PM  For on call review [email or web address].

## 2022-12-11 NOTE — Progress Notes (Addendum)
Patient is alert and oriented x3. She is extremely hard of hearing and her near-sight has deteriorated. Completed bowel prep and is running clear on bowel movement. Had multiple bowel movements last night into the morning. Transfers well x1 assist to [facility]. Denied pain. Scheduled for colonoscopy today. NPO at 0500 today due to late colonoscopy. Q4h blood sugars were 189 (4 units), 104 (0 units) and 172 (4 units).Additional needs denied.

## 2022-12-11 NOTE — Anesthesia Postprocedure Evaluation (Signed)
Anesthesia Post Note  Patient: Heather Long  Procedure(s) Performed: COLONOSCOPY WITH PROPOFOL  Patient location during evaluation: Endoscopy Anesthesia Type: General Level of consciousness: awake and alert Pain management: pain level controlled Vital Signs Assessment: post-procedure vital signs reviewed and stable Respiratory status: spontaneous breathing, nonlabored ventilation, respiratory function stable and patient connected to nasal cannula oxygen Cardiovascular status: blood pressure returned to baseline and stable Postop Assessment: no apparent nausea or vomiting Anesthetic complications: no  No notable events documented.   Last Vitals:  Vitals:   12/11/22 1753 12/11/22 1824  BP: (!) 152/58 (!) 141/90  Pulse: 70 72  Resp: (!) 21 16  Temp:  36.8 C  SpO2: 98% 96%    Last Pain:  Vitals:   12/11/22 1824  TempSrc: Oral  PainSc:                  Dimas Millin

## 2022-12-11 NOTE — Transfer of Care (Signed)
Immediate Anesthesia Transfer of Care Note  Patient: [place] Flett  Procedure(s) Performed: COLONOSCOPY WITH PROPOFOL  Patient Location: PACU  Anesthesia Type:General  Level of Consciousness: sedated  Airway & Oxygen Therapy: Patient Spontanous Breathing and Patient connected to nasal cannula oxygen  Post-op Assessment: Report given to RN and Post -op Vital signs reviewed and stable  Post vital signs: Reviewed and stable  Last Vitals:  Vitals Value Taken Time  BP    Temp 36.4 C 12/11/22 1723  Pulse 54 12/11/22 1723  Resp 20 12/11/22 1723  SpO2 94 % 12/11/22 1723    Last Pain:  Vitals:   12/11/22 1723  TempSrc: Temporal  PainSc: Asleep         Complications: No notable events documented.

## 2022-12-11 NOTE — Progress Notes (Signed)
Initial Nutrition Assessment  DOCUMENTATION CODES:   Obesity unspecified  INTERVENTION:   RD will add supplements with diet advancement   MVI po daily with diet advancement   Gluten free diet per pt request  Pt at high refeed risk  NUTRITION DIAGNOSIS:   Inadequate oral intake related to acute illness as evidenced by NPO status.  GOAL:   Patient will meet greater than or equal to 90% of their needs  MONITOR:   Diet advancement, Labs, Weight trends, I & O's, Skin  REASON FOR ASSESSMENT:   Malnutrition Screening Tool    ASSESSMENT:   89 y/o female with h/o HTN, DM, CKD III, HLD, lymphaedema, depression, breast cancer s/p mastectomy, HOH and GERD who is admitted with abdominal pain and diarrhea and was found to have LBO secondary to focal narrowing of the mid transverse colon r/t mass vs stricture.  Met with pt in room today. Pt is HOH and is difficult to obtain history from. Pt reports good appetite and oral intake at baseline. Pt reports poor oral intake for several days pta r/t diarrhea and abdominal pain. Pt reports that she used to follow a gluten free diet for chronic diarrhea but reports that she is unable to have a gluten free diet at her ALF and reports that she believes this is why she is having diarrhea again. RD unable to find any diagnosis in chart that would warrant a gluten free diet. Pt currently NPO for colonoscopy today. Pt is willing to drink supplements with diet advancement. Pt is likely at refeed risk. RD will add supplements and MVI with diet advancement. Per chart, pt appears weight stable pta.     Medications reviewed and include: heparin, insulin, protonix, NaCl '@75ml'$ /hr  Labs reviewed: K 3.6 wnl, creat 1.46(H) Hgb 9.5(L), Hct 30.2(L) Cbgs- 83, 129, 101, 172 x 24 hrs  AIC 7.1(H)- 1/21  NUTRITION - FOCUSED PHYSICAL EXAM:  Flowsheet Row Most Recent Value  Orbital Region No depletion  Upper Arm Region No depletion  Thoracic and Lumbar Region No  depletion  Buccal Region No depletion  Temple Region No depletion  Clavicle Bone Region No depletion  Clavicle and Acromion Bone Region No depletion  Scapular Bone Region No depletion  Dorsal Hand No depletion  Patellar Region No depletion  Anterior Thigh Region No depletion  Posterior Calf Region No depletion  Edema (RD Assessment) Mild  Hair Reviewed  Eyes Reviewed  Mouth Reviewed  Skin Reviewed  Nails Reviewed   Diet Order:   Diet Order             Diet NPO time specified Except for: Sips with Meds, Ice Chips  Diet effective 0500                  EDUCATION NEEDS:   Education needs have been addressed  Skin:  Skin Assessment: Reviewed RN Assessment  Last BM:  1/23- type 7  Height:   Ht Readings from Last 1 Encounters:  12/11/22 '4\' 11"'$  (1.499 m)    Weight:   Wt Readings from Last 1 Encounters:  12/11/22 86.6 kg    Ideal Body Weight:  44.5 kg  BMI:  Body mass index is 38.56 kg/m.  Estimated Nutritional Needs:   Kcal:  1700-1900kcal/day  Protein:  85-95g/day  Fluid:  1.4-1.6L/day  Koleen Distance MS, RD, LDN Please refer to [facility] for RD and/or RD on-call/weekend/after hours pager

## 2022-12-11 NOTE — Anesthesia Preprocedure Evaluation (Signed)
Anesthesia Evaluation  Patient identified by MRN, date of birth, ID band Patient awake    Reviewed: Allergy & Precautions, NPO status , Patient's Chart, lab work & pertinent test results  History of Anesthesia Complications Negative for: history of anesthetic complications  Airway Mallampati: III  TM Distance: >3 FB Neck ROM: full    Dental  (+) Chipped, Dental Advidsory Given   Pulmonary neg pulmonary ROS, neg COPD, former smoker   Pulmonary exam normal        Cardiovascular hypertension, (-) angina (-) Past MI and (-) CABG negative cardio ROS Normal cardiovascular exam     Neuro/Psych negative neurological ROS  negative psych ROS   GI/Hepatic negative GI ROS, Neg liver ROS,,,  Endo/Other  negative endocrine ROSdiabetes    Renal/GU CRFRenal disease  negative genitourinary   Musculoskeletal   Abdominal   Peds  Hematology  (+) anemia   Anesthesia Other Findings Patient with extreme difficulty hearing.  Past Medical History: No date: Cancer ([place]) No date: Chronic kidney disease No date: Diabetes mellitus without complication (HCC) No date: Gout No date: Hyperlipidemia No date: Hypertension No date: Lymphedema No date: Obesity No date: Osteoporosis No date: Paget's disease of bone No date: Spinal stenosis  Past Surgical History: No date: ABDOMINAL HYSTERECTOMY 2000, 2006: JOINT REPLACEMENT 2010: MASTECTOMY; Left 07/2016: ORIF WRIST FRACTURE  BMI    Body Mass Index: 38.58 kg/m      Reproductive/Obstetrics negative OB ROS                             Anesthesia Physical Anesthesia Plan  ASA: 2  Anesthesia Plan: General   Post-op Pain Management: Minimal or no pain anticipated   Induction: Intravenous  PONV Risk Score and Plan: 3 and Propofol infusion, TIVA and Ondansetron  Airway Management Planned: Nasal Cannula  Additional Equipment: None  Intra-op Plan:    Post-operative Plan:   Informed Consent: I have reviewed the patients History and Physical, chart, labs and discussed the procedure including the risks, benefits and alternatives for the proposed anesthesia with the patient or authorized representative who has indicated his/her understanding and acceptance.     Dental advisory given  Plan Discussed with: CRNA and Surgeon  Anesthesia Plan Comments: (Discussed risks of anesthesia with patient, including possibility of difficulty with spontaneous ventilation under anesthesia necessitating airway intervention, PONV, and rare risks such as cardiac or respiratory or neurological events, and allergic reactions. Discussed the role of CRNA in patient's perioperative care. Patient understands.)       Anesthesia Quick Evaluation

## 2022-12-11 NOTE — Assessment & Plan Note (Addendum)
Stopping losartan as it function has worsened Hydralazine IV as needed for additional BP control Also added IV hydralazine and continue IV Lopressor for better blood pressure control

## 2022-12-11 NOTE — Assessment & Plan Note (Addendum)
Renal function somewhat worsened.  Will start gentle hydration.  Will discontinue losartan with worsening renal function  Lab Results  Component Value Date   CREATININE 1.46 (H) 12/11/2022   CREATININE 1.38 (H) 12/08/2022   CREATININE 1.26 (H) 08/18/2018

## 2022-12-12 ENCOUNTER — Inpatient Hospital Stay: Payer: 59

## 2022-12-12 ENCOUNTER — Encounter: Payer: Self-pay | Admitting: Gastroenterology

## 2022-12-12 DIAGNOSIS — E872 Acidosis, unspecified: Secondary | ICD-10-CM | POA: Insufficient documentation

## 2022-12-12 DIAGNOSIS — K56600 Partial intestinal obstruction, unspecified as to cause: Secondary | ICD-10-CM

## 2022-12-12 DIAGNOSIS — R1084 Generalized abdominal pain: Secondary | ICD-10-CM

## 2022-12-12 DIAGNOSIS — Z7189 Other specified counseling: Secondary | ICD-10-CM

## 2022-12-12 DIAGNOSIS — E876 Hypokalemia: Secondary | ICD-10-CM | POA: Insufficient documentation

## 2022-12-12 LAB — CBC
HCT: 28.1 % — ABNORMAL LOW (ref 36.0–46.0)
Hemoglobin: 8.9 g/dL — ABNORMAL LOW (ref 12.0–15.0)
MCH: 26.7 pg (ref 26.0–34.0)
MCHC: 31.7 g/dL (ref 30.0–36.0)
MCV: 84.4 fL (ref 80.0–100.0)
Platelets: 151 10*3/uL (ref 150–400)
RBC: 3.33 MIL/uL — ABNORMAL LOW (ref 3.87–5.11)
RDW: 14.5 % (ref 11.5–15.5)
WBC: 6 10*3/uL (ref 4.0–10.5)
nRBC: 0 % (ref 0.0–0.2)

## 2022-12-12 LAB — BASIC METABOLIC PANEL
Anion gap: 7 (ref 5–15)
BUN: 13 mg/dL (ref 8–23)
CO2: 21 mmol/L — ABNORMAL LOW (ref 22–32)
Calcium: 8.4 mg/dL — ABNORMAL LOW (ref 8.9–10.3)
Chloride: 112 mmol/L — ABNORMAL HIGH (ref 98–111)
Creatinine, Ser: 1.31 mg/dL — ABNORMAL HIGH (ref 0.44–1.00)
GFR, Estimated: 38 mL/min — ABNORMAL LOW (ref >60)
Glucose, Bld: 83 mg/dL (ref 70–99)
Potassium: 3.4 mmol/L — ABNORMAL LOW (ref 3.5–5.1)
Sodium: 140 mmol/L (ref 135–145)

## 2022-12-12 LAB — GLUCOSE, CAPILLARY
Glucose-Capillary: 87 mg/dL (ref 70–99)
Glucose-Capillary: 89 mg/dL (ref 70–99)
Glucose-Capillary: 209 mg/dL — ABNORMAL HIGH (ref 70–99)

## 2022-12-12 MED ORDER — LOSARTAN POTASSIUM 100 MG PO TABS: 100.0000 mg | ORAL_TABLET | Freq: Every day | ORAL | 0 refills | Status: DC

## 2022-12-12 MED ORDER — CYANOCOBALAMIN 500 MCG PO TABS: 1000.0000 ug | ORAL_TABLET | Freq: Every day | ORAL | 0 refills | Status: DC

## 2022-12-12 MED ORDER — POTASSIUM CHLORIDE CRYS ER 10 MEQ PO TBCR: 10.0000 meq | EXTENDED_RELEASE_TABLET | Freq: Two times a day (BID) | ORAL | 0 refills | Status: DC

## 2022-12-12 MED ORDER — SENNOSIDES-DOCUSATE SODIUM 8.6-50 MG PO TABS: 1.0000 | ORAL_TABLET | Freq: Two times a day (BID) | ORAL | 0 refills | Status: DC

## 2022-12-12 MED ORDER — POTASSIUM CHLORIDE CRYS ER 10 MEQ PO TBCR: 10.0000 meq | EXTENDED_RELEASE_TABLET | Freq: Two times a day (BID) | ORAL | 0 refills | Status: AC

## 2022-12-12 MED ORDER — SODIUM BICARBONATE 650 MG PO TABS: 650.0000 mg | ORAL_TABLET | Freq: Two times a day (BID) | ORAL | 0 refills | Status: DC

## 2022-12-12 MED ORDER — SODIUM BICARBONATE 650 MG PO TABS: 650.0000 mg | ORAL_TABLET | Freq: Two times a day (BID) | ORAL | 0 refills | Status: AC

## 2022-12-12 MED ORDER — POTASSIUM CHLORIDE CRYS ER 20 MEQ PO TBCR
40.0000 meq | EXTENDED_RELEASE_TABLET | Freq: Once | ORAL | Status: AC
  Administered 2022-12-12: 40 meq via ORAL
  Filled 2022-12-12: qty 2

## 2022-12-12 NOTE — TOC Transition Note (Signed)
Transition of Care [facility]) - CM/SW Discharge Note   Patient Details  Name: Heather Long MRN: [id number] Date of Birth: 02/28/1928  Transition of Care [facility]) CM/SW Contact:  Beverly Sessions, RN Phone Number: 12/12/2022, 1:58 PM   Clinical Narrative:    Patient to discharge today. Per Therapy recommendations home health and no dme recommendations Patient is already active with [facility].  Resumption orders faxed to regina at [facility] states that [facility] will follow up with patient about services to assist with medication administration, and meal delivery to the patients room. Patient notified  At bedside called patient's son to see if he would be able to transport. Son Jeanne Ivan states "we havent spoken since April you will have to find someone else"  At that time patients friend Blair Promise walked in and she will be able to transport.  MD, Bedside RN, and Rollene Fare at [facility] notified          Patient Goals and CMS Choice      Discharge Placement                         Discharge Plan and Services Additional resources added to the After Visit Summary for                                       Social Determinants of Health (SDOH) Interventions SDOH Screenings   Food Insecurity: No Food Insecurity (12/09/2022)  Housing: Low Risk  (12/09/2022)  Transportation Needs: No Transportation Needs (12/09/2022)  Utilities: Not At Risk (12/09/2022)  Financial Resource Strain: Low Risk  (12/09/2017)  Physical Activity: Inactive (12/09/2017)  Social Connections: Unknown (12/09/2017)  Stress: No Stress Concern Present (12/09/2017)  Tobacco Use: Medium Risk (12/11/2022)     Readmission Risk Interventions     No data to display

## 2022-12-12 NOTE — Consult Note (Addendum)
Consultation Note Date: 12/12/2022   Patient Name: Heather Long  DOB: Apr 04, 1928  MRN: [id number]  Age / Sex: 89 y.o., female  PCP: Dayton Martes Victoriano Lain, NP Referring Physician: Sharen Hones, MD  Reason for Consultation: Establishing goals of care  HPI/Patient Profile: 89 y.o. female with medical history significant for HTN, DM, CKD 3B, chronic back pain admitted for severe constipation and possible narrowing/stricture/apple core mass of the mid transverse colon seen on CT scan. Possible malignancy.     Clinical Assessment and Goals of Care: In to see patient.  She is sitting in bed with a friend at bedside.  They states they are preparing to discharge home.  Discussed having outpatient palliative to follow-up with her, and her family members.   With this, she states that she is divorced and has 2 sons, neither of which she has a relationship with. Discussed the importance of determining a surrogate decision maker to serve as H POA if she is becomes unable to make medical decisions  for herself.  With conversation, I provided an AD packet for her to take home and complete, and have notarized.  She states an H POA document was completed in the past, and then asked to confirm that completing a new document would void the previous one.  Discussed the need to notarize the document once completed, and ensure that someone trusted has a copy or access to the document.  She is thankful for the forms and this information.  Nursing at bedside actively working on discharging patient.     SUMMARY OF RECOMMENDATIONS   Recommend outpatient palliative to discuss goals of care with patient. Advanced directive packet provided to patient for completion outpatient as she is currently being discharged.  Prognosis:  Unable to determine       Primary Diagnoses: Present on Admission:  B12 deficiency anemia  Stage 3b  chronic kidney disease ([place])  Abdominal pain  Hypertensive urgency  Colonic mass   I have reviewed the medical record, interviewed the patient and family, and examined the patient. The following aspects are pertinent.  Past Medical History:  Diagnosis Date   Cancer ([place])    Chronic kidney disease    Diabetes mellitus without complication (HCC)    Gout    Hyperlipidemia    Hypertension    Lymphedema    Obesity    Osteoporosis    Paget's disease of bone    Spinal stenosis    Social History   Socioeconomic History   Marital status: Divorced    Spouse name: Not on file   Number of children: 2   Years of education: Not on file   Highest education level: 12th grade  Occupational History   Occupation: Retired  Tobacco Use   Smoking status: Former    Packs/day: 0.25    Years: 2.00    Total pack years: 0.50    Types: Cigarettes    Quit date: 01/02/1952    Years since quitting: 70.9   Smokeless tobacco: Never   Tobacco comments:  smoking cessation materials not required  Vaping Use   Vaping Use: Never used  Substance and Sexual Activity   Alcohol use: No   Drug use: No   Sexual activity: Not Currently  Other Topics Concern   Not on file  Social History Narrative   Not on file   Social Determinants of Health   Financial Resource Strain: Low Risk  (12/09/2017)   Overall Financial Resource Strain (CARDIA)    Difficulty of Paying Living Expenses: Not hard at all  Food Insecurity: No Food Insecurity (12/09/2022)   Hunger Vital Sign    Worried About Running Out of Food in the Last Year: Never true    Ran Out of Food in the Last Year: Never true  Transportation Needs: No Transportation Needs (12/09/2022)   PRAPARE - Hydrologist (Medical): No    Lack of Transportation (Non-Medical): No  Physical Activity: Inactive (12/09/2017)   Exercise Vital Sign    Days of Exercise per Week: 0 days    Minutes of Exercise per Session: 0 min  Stress: No  Stress Concern Present (12/09/2017)   [place]    Feeling of Stress : Only a little  Social Connections: Unknown (12/09/2017)   Social Connection and Isolation Panel [NHANES]    Frequency of Communication with Friends and Family: Patient refused    Frequency of Social Gatherings with Friends and Family: Patient refused    Attends Religious Services: Patient refused    Marine scientist or Organizations: Patient refused    Attends Music therapist: Patient refused    Marital Status: Divorced   Family History  Problem Relation Age of Onset   Diabetes Mother    Cancer Mother    Heart disease Father    Throat cancer Sister    Heart disease Brother    Diabetes Brother    Cancer Sister    Heart disease Brother    Heart disease Brother    Scheduled Meds:  escitalopram  5 mg Oral Daily   heparin injection (subcutaneous)  5,000 Units Subcutaneous Q8H   hydrALAZINE  5 mg Intravenous Q6H   insulin aspart  0-20 Units Subcutaneous Q4H   metoprolol tartrate  2.5 mg Intravenous Q12H   pantoprazole  40 mg Oral Daily   Continuous Infusions:  sodium chloride Stopped (12/11/22 0240)   sodium chloride     sodium chloride 75 mL/hr at 12/12/22 0928   PRN Meds:.acetaminophen **OR** acetaminophen, hydrALAZINE, HYDROcodone-acetaminophen, morphine injection, ondansetron **OR** ondansetron (ZOFRAN) IV Medications Prior to Admission:  Prior to Admission medications   Medication Sig Start Date End Date Taking? Authorizing Provider  potassium chloride (KLOR-CON M) 10 MEQ tablet Take 1 tablet (10 mEq total) by mouth 2 (two) times daily for 5 days. 12/12/22 12/17/22 Yes Sharen Hones, MD  senna-docusate (SENOKOT-S) 8.6-50 MG tablet Take 1 tablet by mouth 2 (two) times daily. 12/12/22  Yes Sharen Hones, MD  sodium bicarbonate 650 MG tablet Take 1 tablet (650 mg total) by mouth 2 (two) times daily for 14 days. 12/12/22 12/26/22 Yes  Sharen Hones, MD  bismuth subsalicylate (PEPTO BISMOL) 262 MG chewable tablet Chew 524 mg by mouth as needed. Patient not taking: Reported on 12/10/2022    [provider]  Cholecalciferol (VITAMIN D3) 5000 units CAPS Take 50,000 Units by mouth daily. Daily  Patient not taking: Reported on 12/10/2022    [provider]  cyanocobalamin (VITAMIN B12) 500  MCG tablet Take 2 tablets (1,000 mcg total) by mouth daily. 12/12/22   Sharen Hones, MD  dicyclomine (BENTYL) 10 MG capsule Take 10 mg by mouth every 12 (twelve) hours as needed for spasms. Patient not taking: Reported on 12/10/2022    [provider]  escitalopram (LEXAPRO) 5 MG tablet Take 5 mg by mouth daily. Patient not taking: Reported on 12/10/2022    [provider]  glipiZIDE (GLUCOTROL XL) 2.5 MG 24 hr tablet Take 2.5 mg by mouth daily with breakfast. Patient not taking: Reported on 12/10/2022    [provider]  losartan (COZAAR) 100 MG tablet Take 1 tablet (100 mg total) by mouth daily. 12/12/22   Sharen Hones, MD  Multiple Vitamins-Minerals (PRESERVISION AREDS PO) Take by mouth.    [provider]  pantoprazole (PROTONIX) 40 MG tablet Take 40 mg by mouth daily. Patient not taking: Reported on 12/10/2022    [provider]   Allergies  Allergen Reactions   Ciprofloxacin Nausea And Vomiting   Gluten Meal Other (See Comments)    GI distress   Pioglitazone Other (See Comments)    Leg swelling   Gabapentin Diarrhea, Nausea Only and Other (See Comments)    dizziness   Review of Systems  All other systems reviewed and are negative.   Physical Exam HENT:     Ears:     Comments: Hard of hearing Pulmonary:     Effort: Pulmonary effort is normal.  Neurological:     Mental Status: She is alert.     Vital Signs: BP (!) 141/44 (BP Location: Right Arm)   Pulse 77   Temp 99.5 F (37.5 C) (Oral)   Resp 18   Ht '4\' 11"'$  (1.499 m)   Wt 86.6 kg   SpO2 93%   BMI 38.56 kg/m   Pain Scale: 0-10   Pain Score: 0-No pain   SpO2: SpO2: 93 % O2 Device:SpO2: 93 % O2 Flow Rate: .   IO: Intake/output summary:  Intake/Output Summary (Last 24 hours) at 12/12/2022 1422 Last data filed at 12/11/2022 1720 Gross per 24 hour  Intake 354.48 ml  Output --  Net 354.48 ml    LBM: Last BM Date : 12/11/22 Baseline Weight: Weight: 86.6 kg Most recent weight: Weight: 86.6 kg       Signed by: Asencion Gowda, NP   Please contact Palliative Medicine Team phone at [phone] for questions and concerns.  For individual provider: See Shea Evans

## 2022-12-12 NOTE — Care Management Important Message (Signed)
Important Message  Patient Details  Name: Cleda Imel MRN: [id number] Date of Birth: 03/11/1928   Medicare Important Message Given:  Other (see comment)  Attempted to reach patient via room phone [phone]) to review Medicare IM.  A volunteer picked up the phone and stated she was being discharged, unable to speak at this time.  Per chart notes, Patient Access staff attempted to obtain verbal consent for Medicare IM earlier this morning, both attempts unsuccessful.    Dannette Barbara 12/12/2022, 2:51 PM

## 2022-12-12 NOTE — Discharge Summary (Signed)
Physician Discharge Summary   Patient: Heather Long MRN: [id number] DOB: 05-24-1928  Admit date:     12/09/2022  Discharge date: 12/12/22  Discharge Physician: Sharen Hones   PCP: Sallee Lange, NP   Recommendations at discharge:   Follow-up with PCP in 1 week. Follow-up with general surgery as scheduled. Follow-up with oncology in 1 week.  Discharge Diagnoses: Active Problems:   Abdominal pain   Hypertensive urgency   Type II diabetes mellitus (HCC)   Stage 3b chronic kidney disease (HCC)   B12 deficiency anemia   Colonic mass   Hypokalemia   Metabolic acidosis  Resolved Problems:   * No resolved hospital problems. *  Hospital Course: 89 y.o. female with medical history significant for HTN, DM, CKD 3B, chronic back pain admitted for severe constipation and possible narrowing/stricture/apple core mass of the mid transverse colon seen on CT scan Seen by GI, colonoscopy was performed, showed a large colon mass with obstruction.  Patient is eval by general surgery, patient does not need emergent colectomy, but the patient has not decided that she will want colectomy in the future.  General surgery has scheduled the patient to be followed in the office on 2/14. Patient will also will be followed by oncology in office if cannot see today. Discussed with Dr. Dahlia Byes, currently, patient does not seem to have any evidence of bowel obstruction.  Will continue stool softeners at home.  Follow-up with PCP, oncology and general surgery as outpatient.   Assessment and Plan: Abdominal pain constipation. Colonic mass with possible colon cancer. Patient abdominal pain has resolved after bowel prep with multiple bowel movements.  Colonoscopy showed a large colonic mass, biopsy was obtained.  Most likely this is a colon cancer.  Patient has been seen by general surgery, patient does not have acute obstruction, no need for urgent surgery.  Patient has not decided if he wants surgery.  At  this point, patient be followed with general surgery as outpatient. Patient will be also followed with oncology, oncology has ordered a CT scan for staging. At this point, patient condition has improved, she is medically stable to be discharged.  I will start senna 1 tab twice a day to prevent additional constipation.  Stage 3b chronic kidney disease (HCC) Metabolic acidosis. Hypokalemia. Patient renal function still stable, patient has metabolic acidosis.  But has been improving since fluid was given.  Will continue sodium bicarbonate for 2 weeks. Patient also had a potassium of 3.4, will give 40 mEq oral potassium, will continue 20 mEq of KCl per day for a few days.   Hypertensive urgency Patient blood pressure running high at time of admission, she was not taking her medicines at home.  Losartan can be restarted as the patient does not have acute kidney injury.  B12 deficient anemia. Patient was taking B12 at home, but the patient seem to be not taking it.  I restarted B12 for now.   Type 2 diabetes hemoglobin A1c 7.1.  Follow-up with PCP as outpatient.         Consultants: General surgery, Oncology Procedures performed: none  Disposition: Home health Diet recommendation:  Discharge Diet Orders (From admission, onward)     Start     Ordered   12/12/22 0000  Diet - low sodium heart healthy        12/12/22 1326           Cardiac diet DISCHARGE MEDICATION: Allergies as of 12/12/2022       Reactions  Ciprofloxacin Nausea And Vomiting   Gluten Meal Other (See Comments)   GI distress   Pioglitazone Other (See Comments)   Leg swelling   Gabapentin Diarrhea, Nausea Only, Other (See Comments)   dizziness        Medication List     STOP taking these medications    bismuth subsalicylate 174 MG chewable tablet Commonly known as: PEPTO BISMOL   dicyclomine 10 MG capsule Commonly known as: BENTYL   escitalopram 5 MG tablet Commonly known as: LEXAPRO    glipiZIDE 2.5 MG 24 hr tablet Commonly known as: GLUCOTROL XL   pantoprazole 40 MG tablet Commonly known as: PROTONIX   PRESERVISION AREDS PO   Vitamin D3 125 MCG (5000 UT) Caps       TAKE these medications    cyanocobalamin 500 MCG tablet Commonly known as: VITAMIN B12 Take 2 tablets (1,000 mcg total) by mouth daily. What changed: how much to take   losartan 100 MG tablet Commonly known as: COZAAR Take 1 tablet (100 mg total) by mouth daily.   potassium chloride 10 MEQ tablet Commonly known as: KLOR-CON M Take 1 tablet (10 mEq total) by mouth 2 (two) times daily for 5 days.   senna-docusate 8.6-50 MG tablet Commonly known as: Senokot-S Take 1 tablet by mouth 2 (two) times daily.   sodium bicarbonate 650 MG tablet Take 1 tablet (650 mg total) by mouth 2 (two) times daily for 14 days.        Follow-up Information     [place], [place], MD Follow up on 01/02/2023.   Specialty: General Surgery Contact information: [address] [place] [address] [phone]         Sallee Lange, NP Follow up in 1 week(s).   Specialty: Internal Medicine Contact information: [place] [address] [phone]         Sindy Guadeloupe, MD Follow up in 1 week(s).   Specialty: Oncology Contact information: [place] [address] [phone]                Discharge Exam: Danley Danker Weights   12/08/22 2259 12/11/22 1605  Weight: 86.6 kg 86.6 kg   General exam: Appears calm and comfortable  Respiratory system: Clear to auscultation. Respiratory effort normal. Cardiovascular system: S1 & S2 heard, RRR. No JVD, murmurs, rubs, gallops or clicks. No pedal edema. Gastrointestinal system: Abdomen is nondistended, soft and nontender. No organomegaly or masses felt. Normal bowel sounds heard. Central nervous system: Alert and oriented. No focal neurological deficits. Extremities: Symmetric 5 x 5 power. Skin:  No rashes, lesions or ulcers Psychiatry: Judgement and insight appear normal. Mood & affect appropriate.    Condition at discharge: good  The results of significant diagnostics from this hospitalization (including imaging, microbiology, ancillary and laboratory) are listed below for reference.   Imaging Studies: CT ABDOMEN PELVIS W CONTRAST  Result Date: 12/09/2022 CLINICAL DATA:  Abdominal pain, acute, nonlocalized, diarrhea EXAM: CT ABDOMEN AND PELVIS WITH CONTRAST TECHNIQUE: Multidetector CT imaging of the abdomen and pelvis was performed using the standard protocol following bolus administration of intravenous contrast. RADIATION DOSE REDUCTION: This exam was performed according to the departmental dose-optimization program which includes automated exposure control, adjustment of the mA and/or kV according to patient size and/or use of iterative reconstruction technique. CONTRAST:  77m OMNIPAQUE IOHEXOL 300 MG/ML  SOLN COMPARISON:  05/11/2008 FINDINGS: Lower chest: Extensive multi-vessel coronary artery calcification. Mild cardiomegaly. Central pulmonary arteries are  enlarged in keeping with changes of pulmonary arterial hypertension. Visualized lung bases are clear. Small hiatal hernia. Hepatobiliary: Cholelithiasis without pericholecystic inflammatory change. Liver unremarkable. No intra or extrahepatic biliary ductal dilation. Pancreas: Unremarkable Spleen: Stable probable cyst or hemangioma within the splenic hilum. Scattered additional hypodensities within the spleen may relate to the phase of contrast enhancement. Normal size. Adrenals/Urinary Tract: The adrenal glands are unremarkable. Kidneys are normal in position. Mild bilateral renal cortical atrophy. No enhancing intrarenal masses. No hydronephrosis. No intrarenal or ureteral urinary calculi. Scattered calcifications within the right renal hilum likely represent vascular calcifications. The bladder is unremarkable. Stomach/Bowel: There is  focal narrowing of the mid transverse colon best seen on axial image # 50/2 and coronal image # 18/5 which appears new since prior examination and may represent a focal colonic stricture or apple-core mass. There is moderate stool proximal to this, possibly representing a partial large bowel obstruction. The stomach, small bowel, and large bowel are otherwise unremarkable. Appendix normal. No free intraperitoneal gas or fluid. Vascular/Lymphatic: Moderate aortoiliac atherosclerotic calcification. No aortic aneurysm. Extensive visceral arteriosclerosis with probable hemodynamically significant stenosis of the proximal and mid superior mesenteric artery as well as the renal arteries bilaterally. No pathologic adenopathy within the abdomen and pelvis. Reproductive: Status post hysterectomy. No adnexal masses. Other: Mild nonspecific subcutaneous body wall edema dependently and within the pannus. No abdominal wall hernia. Musculoskeletal: No acute bone abnormality. Degenerative changes are seen within the thoracolumbar spine. No lytic or blastic bone lesion. IMPRESSION: 1. Extensive multi-vessel coronary artery calcification. Mild cardiomegaly. Morphologic changes in keeping with pulmonary arterial hypertension. 2. Cholelithiasis. 3. Focal narrowing of the mid transverse colon, new since prior examination, possibly representing a focal colonic stricture or apple-core mass. Correlation with colonoscopy may be helpful for further evaluation. Moderate stool proximal to this, possibly representing a resultant partial large bowel obstruction. 4. Extensive visceral arteriosclerosis with probable hemodynamically significant stenosis of the proximal and mid superior mesenteric artery as well as the renal arteries bilaterally. If there is clinical evidence of chronic mesenteric ischemia or hemodynamically significant renal artery stenosis, CT arteriography may be helpful for further evaluation. Aortic Atherosclerosis  (ICD10-I70.0). Electronically Signed   By: Fidela Salisbury M.D.   On: 12/09/2022 02:36    Microbiology: Results for orders placed or performed during the hospital encounter of 07/04/17  Wound or Superficial Culture     Status: None   Collection Time: 07/04/17  2:47 PM   Specimen: Leg  Result Value Ref Range Status   Specimen Description LEG LEFT  Final   Special Requests Normal  Final   Gram Stain   Final    RARE WBC PRESENT, PREDOMINANTLY PMN NO ORGANISMS SEEN    Culture   Final    NORMAL SKIN FLORA Performed at [facility], [place] [address] [place], [place] [address]    Report Status 07/07/2017 FINAL  Final    Labs: CBC: Recent Labs  Lab 12/08/22 2310 12/11/22 0514 12/12/22 0548  WBC 5.2 4.2 6.0  HGB 10.4* 9.5* 8.9*  HCT 35.1* 30.2* 28.1*  MCV 91.2 84.4 84.4  PLT 163 151 229   Basic Metabolic Panel: Recent Labs  Lab 12/08/22 2310 12/11/22 0514 12/12/22 0548  NA 138 139 140  K 3.7 3.6 3.4*  CL 111 114* 112*  CO2 19* 18* 21*  GLUCOSE 163* 181* 83  BUN '20 15 13  '$ CREATININE 1.38* 1.46* 1.31*  CALCIUM 9.1 8.3* 8.4*   Liver Function Tests: Recent Labs  Lab  12/08/22 2310  AST 17  ALT 9  ALKPHOS 58  BILITOT 0.8  PROT 6.2*  ALBUMIN 3.7   CBG: Recent Labs  Lab 12/11/22 2010 12/11/22 2314 12/12/22 0555 12/12/22 0739 12/12/22 1141  GLUCAP 183* 159* 87 89 209*    Discharge time spent: greater than 30 minutes.  Signed: Sharen Hones, MD [facility] 12/12/2022

## 2022-12-12 NOTE — Consult Note (Signed)
[facility] SURGICAL CONSULTATION NOTE (initial) - cpt: 81191   HISTORY OF PRESENT ILLNESS (HPI):  89 y.o. female presented to [facility] ED initially on 01/21 secondary to abdominal pain. At that time, she had reported waxing and waning sharp abdominal pain with associated nausea. She was able to have intermittent "partial" bowel movements but felt constipated. She did note her BMs have been mostly diarrhea wince the onset of her pain. No fever, chills, CP, SOB, urinary changes. No blood in her stools. Previous abdominal surgical history positive for abdominal hysterectomy. CT Abdomen/Pelvis was concerning for narrowing of the mid-transverse colon concerning for possible malignancy and large bowel obstruction. She was admitted to the medicine service for this with GI consultation. She underwent colonoscopy on 01/23 with Dr Marius Ditch which was concerning for fungating transverse colon mass. Biopsies were taken which are pending. Most recent labs show Hgb to 8.9 (10.4 on admission), normal WBC at 6.0K, PLT normal at 151K, baseline CKD with sCr - 1.31, and mild hypokalemia to 3.4. From a functional status, she is able to perform ADLs without assistance or SOB. She does live in ALF but is independent. She is capable of going to the store without SOB or CP. She did give a number for a friend Secondary school teacher, who I spoke to on the phone with patient permission, and she corroborated her functional status.   Surgery is consulted by hospitalist physician Dr. Sharen Hones, MD in this context for evaluation and management of large bowel obstruction and likely malignancy.   PAST MEDICAL HISTORY (PMH):  Past Medical History:  Diagnosis Date   Cancer ([place])    Chronic kidney disease    Diabetes mellitus without complication ([place])    Gout    Hyperlipidemia    Hypertension    Lymphedema    Obesity    Osteoporosis    Paget's disease of bone    Spinal stenosis      PAST SURGICAL HISTORY ([place]):  Past Surgical  History:  Procedure Laterality Date   ABDOMINAL HYSTERECTOMY     JOINT REPLACEMENT  2000, 2006   MASTECTOMY Left 2010   ORIF WRIST FRACTURE  07/2016     MEDICATIONS:  Prior to Admission medications   Medication Sig Start Date End Date Taking? Authorizing Provider  bismuth subsalicylate (PEPTO BISMOL) 262 MG chewable tablet Chew 524 mg by mouth as needed. Patient not taking: Reported on 12/10/2022    [provider]  Cholecalciferol (VITAMIN D3) 5000 units CAPS Take 50,000 Units by mouth daily. Daily  Patient not taking: Reported on 12/10/2022    [provider]  dicyclomine (BENTYL) 10 MG capsule Take 10 mg by mouth every 12 (twelve) hours as needed for spasms. Patient not taking: Reported on 12/10/2022    [provider]  escitalopram (LEXAPRO) 5 MG tablet Take 5 mg by mouth daily. Patient not taking: Reported on 12/10/2022    [provider]  glipiZIDE (GLUCOTROL XL) 2.5 MG 24 hr tablet Take 2.5 mg by mouth daily with breakfast. Patient not taking: Reported on 12/10/2022    [provider]  losartan (COZAAR) 100 MG tablet Take 100 mg by mouth daily.  Patient not taking: Reported on 12/10/2022    [provider]  Multiple Vitamins-Minerals (PRESERVISION AREDS PO) Take by mouth.    [provider]  pantoprazole (PROTONIX) 40 MG tablet Take 40 mg by mouth daily. Patient not taking: Reported on 12/10/2022    [provider]  vitamin B-12 (CYANOCOBALAMIN) 500 MCG  tablet Take 1,500 mcg by mouth daily. Patient not taking: Reported on 12/10/2022    [provider]     ALLERGIES:  Allergies  Allergen Reactions   Ciprofloxacin Nausea And Vomiting   Gluten Meal Other (See Comments)    GI distress   Pioglitazone Other (See Comments)    Leg swelling   Gabapentin Diarrhea, Nausea Only and Other (See Comments)    dizziness     SOCIAL HISTORY:  Social History   Socioeconomic History   Marital status: Divorced     Spouse name: Not on file   Number of children: 2   Years of education: Not on file   Highest education level: 12th grade  Occupational History   Occupation: Retired  Tobacco Use   Smoking status: Former    Packs/day: 0.25    Years: 2.00    Total pack years: 0.50    Types: Cigarettes    Quit date: 01/02/1952    Years since quitting: 70.9   Smokeless tobacco: Never   Tobacco comments:    smoking cessation materials not required  Vaping Use   Vaping Use: Never used  Substance and Sexual Activity   Alcohol use: No   Drug use: No   Sexual activity: Not Currently  Other Topics Concern   Not on file  Social History Narrative   Not on file   Social Determinants of Health   Financial Resource Strain: Low Risk  (12/09/2017)   Overall Financial Resource Strain (CARDIA)    Difficulty of Paying Living Expenses: Not hard at all  Food Insecurity: No Food Insecurity (12/09/2022)   Hunger Vital Sign    Worried About Running Out of Food in the Last Year: Never true    Ran Out of Food in the Last Year: Never true  Transportation Needs: No Transportation Needs (12/09/2022)   PRAPARE - Hydrologist (Medical): No    Lack of Transportation (Non-Medical): No  Physical Activity: Inactive (12/09/2017)   Exercise Vital Sign    Days of Exercise per Week: 0 days    Minutes of Exercise per Session: 0 min  Stress: No Stress Concern Present (12/09/2017)   [place]    Feeling of Stress : Only a little  Social Connections: Unknown (12/09/2017)   Social Connection and Isolation Panel [NHANES]    Frequency of Communication with Friends and Family: Patient refused    Frequency of Social Gatherings with Friends and Family: Patient refused    Attends Religious Services: Patient refused    Active Member of Clubs or Organizations: Patient refused    Attends Archivist Meetings: Patient refused    Marital  Status: Divorced  Human resources officer Violence: Not At Risk (12/09/2022)   Humiliation, Afraid, Rape, and Kick questionnaire    Fear of Current or Ex-Partner: No    Emotionally Abused: No    Physically Abused: No    Sexually Abused: No     FAMILY HISTORY:  Family History  Problem Relation Age of Onset   Diabetes Mother    Cancer Mother    Heart disease Father    Throat cancer Sister    Heart disease Brother    Diabetes Brother    Cancer Sister    Heart disease Brother    Heart disease Brother       REVIEW OF SYSTEMS:  Review of Systems  Constitutional:  Negative for chills and fever.  Respiratory:  Negative for cough and shortness of breath.   Cardiovascular:  Negative for chest pain and palpitations.  Gastrointestinal:  Positive for abdominal pain, diarrhea and nausea. Negative for blood in stool, melena and vomiting.  Genitourinary:  Negative for dysuria and urgency.  All other systems reviewed and are negative.   VITAL SIGNS:  Temp:  [97.3 F (36.3 C)-100 F (37.8 C)] 99.5 F (37.5 C) (01/24 0807) Pulse Rate:  [54-92] 77 (01/24 0807) Resp:  [16-21] 18 (01/24 0807) BP: (97-184)/(30-90) 141/44 (01/24 0807) SpO2:  [93 %-99 %] 93 % (01/24 0807) Weight:  [86.6 kg] 86.6 kg (01/23 1605)     Height: '4\' 11"'$  (149.9 cm) Weight: 86.6 kg BMI (Calculated): 38.54   INTAKE/OUTPUT:  01/23 0701 - 01/24 0700 In: 354.5 [I.V.:354.5] Out: 250 [Urine:250]  PHYSICAL EXAM:  Physical Exam Vitals and nursing note reviewed. Exam conducted with a chaperone present.  Constitutional:      General: She is not in acute distress.    Appearance: She is well-developed. She is obese. She is not ill-appearing.  HENT:     Head: Normocephalic and atraumatic.     Right Ear: Decreased hearing noted.     Left Ear: Decreased hearing noted.     Ears:     Comments: Extremely HOH Eyes:     General: No scleral icterus.    Extraocular Movements: Extraocular movements intact.  Cardiovascular:     Rate  and Rhythm: Normal rate and regular rhythm.     Heart sounds: Normal heart sounds. No murmur heard. Pulmonary:     Effort: Pulmonary effort is normal. No respiratory distress.     Breath sounds: Normal breath sounds.  Abdominal:     General: There is no distension.     Palpations: Abdomen is soft.     Tenderness: There is abdominal tenderness in the epigastric area. There is no guarding or rebound.     Hernia: No hernia is present.     Comments: Abdomen is soft, there is firmness appreciable in upper abdomen, non-distended, no rebound/guarding  Genitourinary:    Comments: Deferred Skin:    General: Skin is warm and dry.     Coloration: Skin is not jaundiced or pale.  Neurological:     General: No focal deficit present.     Mental Status: She is alert and oriented to person, place, and time.  Psychiatric:        Mood and Affect: Mood normal.        Behavior: Behavior normal.      Labs:     Latest Ref Rng & Units 12/12/2022    5:48 AM 12/11/2022    5:14 AM 12/08/2022   11:10 PM  CBC  WBC 4.0 - 10.5 K/uL 6.0  4.2  5.2   Hemoglobin 12.0 - 15.0 g/dL 8.9  9.5  10.4   Hematocrit 36.0 - 46.0 % 28.1  30.2  35.1   Platelets 150 - 400 K/uL 151  151  163       Latest Ref Rng & Units 12/12/2022    5:48 AM 12/11/2022    5:14 AM 12/08/2022   11:10 PM  CMP  Glucose 70 - 99 mg/dL 83  181  163   BUN 8 - 23 mg/dL '13  15  20   '$ Creatinine 0.44 - 1.00 mg/dL 1.31  1.46  1.38   Sodium 135 - 145 mmol/L 140  139  138   Potassium 3.5 - 5.1 mmol/L 3.4  3.6  3.7   Chloride 98 - 111 mmol/L 112  114  111   CO2 22 - 32 mmol/L '21  18  19   '$ Calcium 8.9 - 10.3 mg/dL 8.4  8.3  9.1   Total Protein 6.5 - 8.1 g/dL   6.2   Total Bilirubin 0.3 - 1.2 mg/dL   0.8   Alkaline Phos 38 - 126 U/L   58   AST 15 - 41 U/L   17   ALT 0 - 44 U/L   9      Imaging studies:   CT Abdomen/Pelvis (12/09/2022) personally reviewed showing narrowing of the transverse colon concerning for mass, partial large bowel  obstructions, decompressed colon distally, sigmoid colon appears redundant, and radiologist report reviewed below:  IMPRESSION: 1. Extensive multi-vessel coronary artery calcification. Mild cardiomegaly. Morphologic changes in keeping with pulmonary arterial hypertension. 2. Cholelithiasis. 3. Focal narrowing of the mid transverse colon, new since prior examination, possibly representing a focal colonic stricture or apple-core mass. Correlation with colonoscopy may be helpful for further evaluation. Moderate stool proximal to this, possibly representing a resultant partial large bowel obstruction. 4. Extensive visceral arteriosclerosis with probable hemodynamically significant stenosis of the proximal and mid superior mesenteric artery as well as the renal arteries bilaterally. If there is clinical evidence of chronic mesenteric ischemia or hemodynamically significant renal artery stenosis, CT arteriography may be helpful for further evaluation.   Assessment/Plan: (ICD-10's: K87.89) 89 y.o. female with partial large bowel obstruction secondary to transverse colon mass concerning for possible malignancy, complicated by pertinent comorbidities including advanced age.   - Discussed current findings and overall concern for malignancy with patient, as well as her friend Sheryl [phone]) via telephone. Next steps would be to consider surgical resection, most likely via laparotomy, and may likely need ostomy. She does appear to be very high functioning despite advanced age. She is not completely obstructed so there is time to discuss procedure further. Patient not sure what she would like to do. We will continue to follow up regarding decision making for surgery   - Okay for FLD as tolerated - Monitor abdominal examination; on-going bowel function - Pain control prn; antiemetics prn   - Mobilize as tolerated   All of the above findings and recommendations were discussed with the patient,  and the patient's friend (sheryl - [phone]) via telephone, and all of their questions were answered to their expressed satisfaction.  Thank you for the opportunity to participate in this patient's care.   -- Edison Simon, PA-C [facility] 12/12/2022, 9:19 AM M-F: 7am - 4pm

## 2022-12-12 NOTE — Evaluation (Signed)
Occupational Therapy Evaluation Patient Details Name: Heather Long MRN: [id number] DOB: Jan 27, 1928 Today's Date: 12/12/2022   History of Present Illness 89 y.o. female with medical history significant for HTN, DM, CKD 3B, chronic back pain admitted for severe constipation and possible narrowing/stricture/apple core mass of the mid transverse colon seen on CT scan   Clinical Impression   Heather Long was seen for OT/PT co-evaluation this date. Prior to hospital admission, pt was MOD I for mobility using SPC as needed. Pt lives at [facility]. Pt presents to acute OT demonstrating impaired ADL performance and functional mobility 2/2 functional strength/ROM deficits. Pt currently requires CGA for BSC t/f, improved with RW for functional mobility ~160 ft. MIN A pericare standing, assist for thoroughness. Pt would benefit from skilled OT to address noted impairments and functional limitations (see below for any additional details). Upon hospital discharge, recommend HHOT to maximize pt safety and return to PLOF.    Recommendations for follow up therapy are one component of a multi-disciplinary discharge planning process, led by the attending physician.  Recommendations may be updated based on patient status, additional functional criteria and insurance authorization.   Follow Up Recommendations  Home health OT     Assistance Recommended at Discharge Set up Supervision/Assistance  Patient can return home with the following Help with stairs or ramp for entrance    Functional Status Assessment  Patient has had a recent decline in their functional status and demonstrates the ability to make significant improvements in function in a reasonable and predictable amount of time.  Equipment Recommendations  None recommended by OT    Recommendations for Other Services       Precautions / Restrictions Precautions Precautions: Fall Restrictions Weight Bearing Restrictions: No       Mobility Bed Mobility Overal bed mobility: Needs Assistance Bed Mobility: Supine to Sit, Sit to Supine     Supine to sit: Min assist Sit to supine: Min guard        Transfers Overall transfer level: Needs assistance Equipment used: Rolling walker (2 wheels) Transfers: Sit to/from Stand Sit to Stand: Min guard                  Balance Overall balance assessment: Needs assistance Sitting-balance support: No upper extremity supported, Feet supported Sitting balance-Leahy Scale: Normal     Standing balance support: No upper extremity supported, During functional activity Standing balance-Leahy Scale: Fair                             ADL either performed or assessed with clinical judgement   ADL Overall ADL's : Needs assistance/impaired                                       General ADL Comments: CGA for BSC t/f, improved with RW for functional mobilbity ~160 ft. MIN A pericare standing, assist for thoroughness.      Pertinent Vitals/Pain Pain Assessment Pain Assessment: No/denies pain     Hand Dominance     Extremity/Trunk Assessment Upper Extremity Assessment Upper Extremity Assessment: Overall WFL for tasks assessed   Lower Extremity Assessment Lower Extremity Assessment: Overall WFL for tasks assessed       Communication Communication Communication: HOH   Cognition Arousal/Alertness: Awake/alert Behavior During Therapy: WFL for tasks assessed/performed Overall Cognitive Status: Within Functional Limits for tasks assessed  Home Living Family/patient expects to be discharged to:: Assisted living                             Home Equipment: Rolling Walker (2 wheels);Cane - single point          Prior Functioning/Environment Prior Level of Function : Independent/Modified Independent                        OT Problem List:  Decreased strength;Decreased range of motion;Decreased activity tolerance;Decreased safety awareness      OT Treatment/Interventions: Self-care/ADL training;Therapeutic exercise;Energy conservation;DME and/or AE instruction;Therapeutic activities;Patient/family education;Balance training    OT Goals(Current goals can be found in the care plan section) Acute Rehab OT Goals Patient Stated Goal: to go home OT Goal Formulation: With patient Time For Goal Achievement: 12/26/22 Potential to Achieve Goals: Good ADL Goals Pt Will Perform Grooming: Independently;standing Pt Will Perform Lower Body Dressing: with modified independence;sit to/from stand Pt Will Transfer to Toilet: with modified independence;ambulating;regular height toilet  OT Frequency: Min 2X/week    Co-evaluation PT/OT/SLP Co-Evaluation/Treatment: Yes Reason for Co-Treatment: To address functional/ADL transfers PT goals addressed during session: Mobility/safety with mobility OT goals addressed during session: ADL's and self-care      AM-PAC OT "6 Clicks" Daily Activity     Outcome Measure Help from another person eating meals?: None Help from another person taking care of personal grooming?: A Little Help from another person toileting, which includes using toliet, bedpan, or urinal?: A Little Help from another person bathing (including washing, rinsing, drying)?: A Little Help from another person to put on and taking off regular upper body clothing?: None Help from another person to put on and taking off regular lower body clothing?: A Little 6 Click Score: 20   End of Session    Activity Tolerance: Patient tolerated treatment well Patient left: in bed;with call bell/phone within reach;with bed alarm set (MD seated EOB)  OT Visit Diagnosis: Other abnormalities of gait and mobility (R26.89);Muscle weakness (generalized) (M62.81)                Time: 6808-8110 OT Time Calculation (min): 34 min Charges:  OT General  Charges $OT Visit: 1 Visit OT Evaluation $OT Eval Moderate Complexity: 1 Mod OT Treatments $Self Care/Home Management : 8-22 mins  Heather Long, M.S. OTR/L  12/12/22, 1:24 PM  ascom [phone]

## 2022-12-12 NOTE — Progress Notes (Addendum)
Bag of belongings delivered to the [facility] desk by volunteer. Volunteer reported that the person that dropped off the belongings said they could not find the hearing aides so the hearing aides were not delivered. When given to pt, pt reports that she has the hearing aides.

## 2022-12-12 NOTE — Evaluation (Signed)
Physical Therapy Evaluation Patient Details Name: Heather Long MRN: [id number] DOB: 1927/12/29 Today's Date: 12/12/2022  History of Present Illness  89 y.o. female with medical history significant for HTN, DM, CKD 3B, chronic back pain admitted for severe constipation and possible narrowing/stricture/apple core mass of the mid transverse colon seen on CT scan  Clinical Impression  CO-evaluation performed this date. Pt is a pleasant 89 year old female who was admitted for anemia. Pt performs bed mobility, transfers, and ambulation with cga with RW. Further ambulation performed with SPC. Pt demonstrates deficits with ambulation.  Would benefit from skilled PT to address above deficits and promote optimal return to PLOF. Recommend transition to [place] upon discharge from acute hospitalization.      Recommendations for follow up therapy are one component of a multi-disciplinary discharge planning process, led by the attending physician.  Recommendations may be updated based on patient status, additional functional criteria and insurance authorization.  Follow Up Recommendations Home health PT (resume services through [facility])      Assistance Recommended at Discharge PRN  Patient can return home with the following  A little help with walking and/or transfers    Equipment Recommendations None recommended by PT  Recommendations for Other Services       Functional Status Assessment Patient has not had a recent decline in their functional status     Precautions / Restrictions Precautions Precautions: Fall Restrictions Weight Bearing Restrictions: No      Mobility  Bed Mobility Overal bed mobility: Needs Assistance Bed Mobility: Supine to Sit, Sit to Supine     Supine to sit: Min assist Sit to supine: Min guard   General bed mobility comments: very motivated, however slightly impulsive and tries to move prior to therapist direction    Transfers Overall transfer level:  Needs assistance Equipment used: Rolling walker (2 wheels) Transfers: Sit to/from Stand Sit to Stand: Min guard           General transfer comment: safe technique. RW used    Ambulation/Gait Ambulation/Gait assistance: Counsellor (Feet): 200 Feet Assistive device: Rolling walker (2 wheels), Straight cane Gait Pattern/deviations: Step-through pattern       General Gait Details: ambulated around RN station with reciprocal gait pattern. Additional ambulation performed with SPC, however prefers RW  Financial trader Rankin (Stroke Patients Only)       Balance Overall balance assessment: Needs assistance Sitting-balance support: No upper extremity supported, Feet supported Sitting balance-Leahy Scale: Normal     Standing balance support: No upper extremity supported, During functional activity Standing balance-Leahy Scale: Fair                               Pertinent Vitals/Pain Pain Assessment Pain Assessment: No/denies pain    Home Living Family/patient expects to be discharged to:: Assisted living                 Home Equipment: Rollator (4 wheels);Cane - single point      Prior Function Prior Level of Function : Independent/Modified Independent             Mobility Comments: reports no falls ADLs Comments: reports she is indep with all ADLs, facility provides food     Hand Dominance        Extremity/Trunk Assessment   Upper Extremity Assessment Upper Extremity Assessment:  Overall [facility] for tasks assessed    Lower Extremity Assessment Lower Extremity Assessment: Overall WFL for tasks assessed       Communication   Communication: HOH  Cognition Arousal/Alertness: Awake/alert Behavior During Therapy: WFL for tasks assessed/performed Overall Cognitive Status: Within Functional Limits for tasks assessed                                          General  Comments      Exercises Other Exercises Other Exercises: transferred to [facility] for extended time secondary to incontient BM. Needs slight assist for hygiene   Assessment/Plan    PT Assessment All further PT needs can be met in the next venue of care  PT Problem List Decreased mobility       PT Treatment Interventions Gait training;DME instruction;Balance training    PT Goals (Current goals can be found in the Care Plan section)  Acute Rehab PT Goals Patient Stated Goal: to go home PT Goal Formulation: With patient Time For Goal Achievement: 12/12/22 Potential to Achieve Goals: Good    Frequency Min 2X/week     Co-evaluation   Reason for Co-Treatment: To address functional/ADL transfers PT goals addressed during session: Mobility/safety with mobility OT goals addressed during session: ADL's and self-care       AM-PAC PT "6 Clicks" Mobility  Outcome Measure Help needed turning from your back to your side while in a flat bed without using bedrails?: None Help needed moving from lying on your back to sitting on the side of a flat bed without using bedrails?: None Help needed moving to and from a bed to a chair (including a wheelchair)?: A Little Help needed standing up from a chair using your arms (e.g., wheelchair or bedside chair)?: A Little Help needed to walk in hospital room?: A Little Help needed climbing 3-5 steps with a railing? : A Little 6 Click Score: 20    End of Session   Activity Tolerance: Patient tolerated treatment well Patient left: in bed;with bed alarm set Nurse Communication: Mobility status PT Visit Diagnosis: Difficulty in walking, not elsewhere classified (R26.2)    Time: 2878-6767 PT Time Calculation (min) (ACUTE ONLY): 36 min   Charges:   PT Evaluation $PT Eval Low Complexity: 1 Low PT Treatments $Gait Training: 8-22 mins        Greggory Stallion, PT, DPT, GCS [phone]   Lauretta Sallas 12/12/2022, 4:25 PM

## 2022-12-12 NOTE — Progress Notes (Signed)
   12/12/22 1400  Spiritual Encounters  Type of Visit Initial  Care provided to: Patient  Conversation partners present during encounter Other (comment) (Family friend)  Referral source Nurse (RN/NT/LPN)  Reason for visit Routine spiritual support  OnCall Visit Yes   Chaplain responded to consult. Chaplain provided compassionate presence and reflective listening as patient was sharing about discharge plans. Chaplain services are available for follow up as needed.

## 2022-12-12 NOTE — Plan of Care (Signed)

## 2022-12-12 NOTE — TOC Progression Note (Signed)
Transition of Care [facility]) - Progression Note    Patient Details  Name: Heather Long MRN: [id number] Date of Birth: 1928-05-28  Transition of Care [facility]) CM/SW Contact  Beverly Sessions, RN Phone Number: 12/12/2022, 10:48 AM  Clinical Narrative:     Spoke with Rollene Fare at [facility] to see if anyone would be available to bring patient's hearing aids, phone and charge. She is going to check and get back with me       Expected Discharge Plan and Services                                               Social Determinants of Health (SDOH) Interventions SDOH Screenings   Food Insecurity: No Food Insecurity (12/09/2022)  Housing: Low Risk  (12/09/2022)  Transportation Needs: No Transportation Needs (12/09/2022)  Utilities: Not At Risk (12/09/2022)  Financial Resource Strain: Low Risk  (12/09/2017)  Physical Activity: Inactive (12/09/2017)  Social Connections: Unknown (12/09/2017)  Stress: No Stress Concern Present (12/09/2017)  Tobacco Use: Medium Risk (12/11/2022)    Readmission Risk Interventions     No data to display

## 2022-12-13 ENCOUNTER — Other Ambulatory Visit: Payer: Self-pay | Admitting: Pathology

## 2022-12-13 LAB — SURGICAL PATHOLOGY

## 2022-12-18 ENCOUNTER — Inpatient Hospital Stay: Payer: 59

## 2022-12-18 ENCOUNTER — Encounter: Payer: Self-pay | Admitting: Oncology

## 2022-12-18 ENCOUNTER — Inpatient Hospital Stay: Payer: 59 | Attending: Oncology | Admitting: Oncology

## 2022-12-18 VITALS — BP 189/81 | HR 76 | Temp 97.8°F | Resp 19 | Wt 183.5 lb

## 2022-12-18 DIAGNOSIS — D649 Anemia, unspecified: Secondary | ICD-10-CM | POA: Insufficient documentation

## 2022-12-18 DIAGNOSIS — C184 Malignant neoplasm of transverse colon: Secondary | ICD-10-CM | POA: Diagnosis present

## 2022-12-18 LAB — COMPREHENSIVE METABOLIC PANEL
ALT: 11 U/L (ref 0–44)
AST: 16 U/L (ref 15–41)
Albumin: 4 g/dL (ref 3.5–5.0)
Alkaline Phosphatase: 62 U/L (ref 38–126)
Anion gap: 9 (ref 5–15)
BUN: 13 mg/dL (ref 8–23)
CO2: 21 mmol/L — ABNORMAL LOW (ref 22–32)
Calcium: 9.4 mg/dL (ref 8.9–10.3)
Chloride: 109 mmol/L (ref 98–111)
Creatinine, Ser: 1.37 mg/dL — ABNORMAL HIGH (ref 0.44–1.00)
GFR, Estimated: 36 mL/min — ABNORMAL LOW (ref >60)
Glucose, Bld: 150 mg/dL — ABNORMAL HIGH (ref 70–99)
Potassium: 3.5 mmol/L (ref 3.5–5.1)
Sodium: 139 mmol/L (ref 135–145)
Total Bilirubin: 0.5 mg/dL (ref 0.3–1.2)
Total Protein: 6.7 g/dL (ref 6.5–8.1)

## 2022-12-18 LAB — CBC WITH DIFFERENTIAL/PLATELET
Abs Immature Granulocytes: 0.02 10*3/uL (ref 0.00–0.07)
Basophils Absolute: 0.1 10*3/uL (ref 0.0–0.1)
Basophils Relative: 1 %
Eosinophils Absolute: 0.2 10*3/uL (ref 0.0–0.5)
Eosinophils Relative: 4 %
HCT: 33.7 % — ABNORMAL LOW (ref 36.0–46.0)
Hemoglobin: 10.9 g/dL — ABNORMAL LOW (ref 12.0–15.0)
Immature Granulocytes: 0 %
Lymphocytes Relative: 25 %
Lymphs Abs: 1.3 10*3/uL (ref 0.7–4.0)
MCH: 27 pg (ref 26.0–34.0)
MCHC: 32.3 g/dL (ref 30.0–36.0)
MCV: 83.6 fL (ref 80.0–100.0)
Monocytes Absolute: 0.3 10*3/uL (ref 0.1–1.0)
Monocytes Relative: 7 %
Neutro Abs: 3.2 10*3/uL (ref 1.7–7.7)
Neutrophils Relative %: 63 %
Platelets: 195 10*3/uL (ref 150–400)
RBC: 4.03 MIL/uL (ref 3.87–5.11)
RDW: 13.9 % (ref 11.5–15.5)
WBC: 5 10*3/uL (ref 4.0–10.5)
nRBC: 0 % (ref 0.0–0.2)

## 2022-12-18 LAB — FOLATE: Folate: 11.3 ng/mL (ref >5.9)

## 2022-12-18 LAB — IRON AND TIBC
Iron: 52 ug/dL (ref 28–170)
Saturation Ratios: 16 % (ref 10.4–31.8)
TIBC: 326 ug/dL (ref 250–450)
UIBC: 274 ug/dL

## 2022-12-18 LAB — FERRITIN: Ferritin: 57 ng/mL (ref 11–307)

## 2022-12-18 LAB — VITAMIN B12: Vitamin B-12: 776 pg/mL (ref 180–914)

## 2022-12-18 NOTE — Progress Notes (Signed)
Patient has concerns about her medication.She is currently not taking anything due to AVS printed from hospital and she read that she was suppose to stop all medications. Patient states she doesn't know to much about what's going on. Patient states she has been on liquid diet since the last hospital admission and what's to know what she can and can't eat.

## 2022-12-20 LAB — CEA: CEA: 2 ng/mL (ref 0.0–4.7)

## 2022-12-23 NOTE — Progress Notes (Signed)
Hematology/Oncology Consult note [facility] [phone] [phone]  Patient Care Team: Sallee Lange, NP as PCP - General (Internal Medicine) [facility] as Consulting Physician (Orthopedic Surgery) Anthonette Legato, MD (Nephrology) Eulogio Bear, MD as Consulting Physician (Ophthalmology) Tamala Julian Jonette Eva, NP (Inactive) as Nurse Practitioner (Hospice and Palliative Medicine) Reche Dixon, PA-C (Orthopedic Surgery) Lucky Cowboy Erskine Squibb, MD as Referring Physician (Vascular Surgery) Clent Jacks, RN as Oncology Nurse Navigator   Name of the patient: Heather Long  [id number]  1928/02/14    Reason for referral-new diagnosis of colon cancer   Referring physician-Sarah Gauger, NP  Date of visit: 12/23/22   History of presenting illness- Patient is a 89 year old female who presented to the hospital in January 2024 with symptoms of abdominal pain.  She had a CT abdomen and pelvis with contrast which showed focal narrowing of the mid transverse colon possibly a colonic stricture and an apple core mass.  This was followed by a colonoscopy which showed fungating and ulcerated partially obstructing mass 65 cm proximal to the anus and there were other polyps seen in the descending colon and the transverse colon.  Biopsy from the suspected mass was positive for adenocarcinoma.  Patient has met with surgery Dr. Adora Fridge but has not yet decided about proceeding with surgery.  She is able to move her bowels.  She is independent of her ADLs.  ECOG PS- 2  Pain scale- 0   Review of systems- Review of Systems  Constitutional:  Positive for malaise/fatigue. Negative for chills, fever and weight loss.  HENT:  Negative for congestion, ear discharge and nosebleeds.   Eyes:  Negative for blurred vision.  Respiratory:  Negative for cough, hemoptysis, sputum production, shortness of breath and wheezing.   Cardiovascular:  Negative for chest  pain, palpitations, orthopnea and claudication.  Gastrointestinal:  Negative for abdominal pain, blood in stool, constipation, diarrhea, heartburn, melena, nausea and vomiting.  Genitourinary:  Negative for dysuria, flank pain, frequency, hematuria and urgency.  Musculoskeletal:  Negative for back pain, joint pain and myalgias.  Skin:  Negative for rash.  Neurological:  Negative for dizziness, tingling, focal weakness, seizures, weakness and headaches.  Endo/Heme/Allergies:  Does not bruise/bleed easily.  Psychiatric/Behavioral:  Negative for depression and suicidal ideas. The patient does not have insomnia.     Allergies  Allergen Reactions   Ciprofloxacin Nausea And Vomiting   Gluten Meal Other (See Comments)    GI distress   Pioglitazone Other (See Comments)    Leg swelling   Gabapentin Diarrhea, Nausea Only and Other (See Comments)    dizziness    Patient Active Problem List   Diagnosis Date Noted   Hypokalemia 40/97/3532   Metabolic acidosis 99/24/2683   B12 deficiency anemia 12/09/2022   Abdominal pain 12/09/2022   Hypertensive urgency 12/09/2022   Colonic mass 12/09/2022   Swelling of limb 01/26/2022   Lumbar spondylosis 05/14/2019   Lumbar facet arthropathy 05/14/2019   SI joint arthritis 05/14/2019   Chronic pain syndrome 04/16/2019   Fibromyalgia 04/16/2019   Lumbar degenerative disc disease 04/16/2019   Lumbar facet joint syndrome 04/16/2019   Chronic SI joint pain 04/16/2019   Ankylosing spondylitis of lumbosacral region ([place]) 04/16/2019   Stage 3b chronic kidney disease ([place]) 04/16/2019   Lymphedema 09/17/2018   Long term current use of insulin ([place]) 09/12/2018   Rotator cuff disorder, right 04/15/2018   Secondary hyperparathyroidism of renal origin ([place]) 10/16/2017   Venous stasis dermatitis of left lower extremity 07/16/2017  Chronic congestion of paranasal sinus 03/13/2017   Degeneration macular 05/23/2016   Osteitis deformans 05/23/2016   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/2016   Psoriasis 05/23/2016   Vitamin B12 deficiency 05/16/2016   Obesity, Class III, BMI 40-49.9 (morbid obesity) ([place]) 05/02/2016   Type II diabetes mellitus ([place]) 05/02/2016   Vitamin D deficiency 05/02/2016   Lower extremity edema 05/02/2016   Spinal stenosis of lumbar region 05/02/2016   Hyperlipidemia associated with type 2 diabetes mellitus ([place]) 06/02/2012   History of breast cancer 06/14/2011   Generalized OA 06/14/2011   Lymphedema of arm 06/14/2011   OP (osteoporosis) 06/14/2011   Arthritis 06/14/2011     Past Medical History:  Diagnosis Date   Cancer ([place])    Chronic kidney disease    Diabetes mellitus without complication ([place])    Gout    Hyperlipidemia    Hypertension    Lymphedema    Obesity    Osteoporosis    Paget's disease of bone    Spinal stenosis      Past Surgical History:  Procedure Laterality Date   ABDOMINAL HYSTERECTOMY     COLONOSCOPY WITH PROPOFOL N/A 12/11/2022   Procedure: COLONOSCOPY WITH PROPOFOL;  Surgeon: Lin Landsman, MD;  Location: [facility] ENDOSCOPY;  Service: Gastroenterology;  Laterality: N/A;   JOINT REPLACEMENT  2000, 2006   MASTECTOMY Left 2010   ORIF WRIST FRACTURE  07/2016    Social History   Socioeconomic History   Marital status: Divorced    Spouse name: Not on file   Number of children: 2   Years of education: Not on file   Highest education level: 12th grade  Occupational History   Occupation: Retired  Tobacco Use   Smoking status: Former    Packs/day: 0.25    Years: 2.00    Total pack years: 0.50    Types: Cigarettes    Quit date: 01/02/1952    Years since quitting: 71.0   Smokeless tobacco: Never   Tobacco comments:    smoking cessation materials not required  Vaping Use   Vaping Use: Never used  Substance and Sexual Activity   Alcohol use: No   Drug use: No   Sexual activity: Not Currently  Other Topics Concern   Not on file  Social History Narrative    Not on file   Social Determinants of Health   Financial Resource Strain: Low Risk  (12/09/2017)   Overall Financial Resource Strain (CARDIA)    Difficulty of Paying Living Expenses: Not hard at all  Food Insecurity: No Food Insecurity (12/09/2022)   Hunger Vital Sign    Worried About Running Out of Food in the Last Year: Never true    [place] in the Last Year: Never true  Transportation Needs: No Transportation Needs (12/09/2022)   PRAPARE - Hydrologist (Medical): No    Lack of Transportation (Non-Medical): No  Physical Activity: Inactive (12/09/2017)   Exercise Vital Sign    Days of Exercise per Week: 0 days    Minutes of Exercise per Session: 0 min  Stress: No Stress Concern Present (12/09/2017)   [place]    Feeling of Stress : Only a little  Social Connections: Unknown (12/09/2017)   Social Connection and Isolation Panel [NHANES]    Frequency of Communication with Friends and Family: Patient refused    Frequency of Social Gatherings with Friends and Family:  Patient refused    Attends Religious Services: Patient refused    Active Member of Clubs or Organizations: Patient refused    Attends Archivist Meetings: Patient refused    Marital Status: Divorced  Human resources officer Violence: Not At Risk (12/09/2022)   Humiliation, Afraid, Rape, and Kick questionnaire    Fear of Current or Ex-Partner: No    Emotionally Abused: No    Physically Abused: No    Sexually Abused: No     Family History  Problem Relation Age of Onset   Diabetes Mother    Cancer Mother    Heart disease Father    Throat cancer Sister    Heart disease Brother    Diabetes Brother    Cancer Sister    Heart disease Brother    Heart disease Brother      Current Outpatient Medications:    cyanocobalamin (VITAMIN B12) 500 MCG tablet, Take 2 tablets (1,000 mcg total) by mouth daily., Disp: 30 tablet,  Rfl: 0   losartan (COZAAR) 100 MG tablet, Take 1 tablet (100 mg total) by mouth daily., Disp: 30 tablet, Rfl: 0   potassium chloride (KLOR-CON M) 10 MEQ tablet, Take 1 tablet (10 mEq total) by mouth 2 (two) times daily for 5 days., Disp: 10 tablet, Rfl: 0   senna-docusate (SENOKOT-S) 8.6-50 MG tablet, Take 1 tablet by mouth 2 (two) times daily., Disp: 100 tablet, Rfl: 0   sodium bicarbonate 650 MG tablet, Take 1 tablet (650 mg total) by mouth 2 (two) times daily for 14 days., Disp: 28 tablet, Rfl: 0   Physical exam:  Vitals:   12/18/22 1100  BP: (!) 189/81  Pulse: 76  Resp: 19  Temp: 97.8 F (36.6 C)  SpO2: 98%  Weight: 183 lb 8 oz (83.2 kg)   Physical Exam Cardiovascular:     Rate and Rhythm: Normal rate and regular rhythm.     Heart sounds: Normal heart sounds.  Pulmonary:     Effort: Pulmonary effort is normal.     Breath sounds: Normal breath sounds.  Abdominal:     General: Bowel sounds are normal.     Palpations: Abdomen is soft.  Skin:    General: Skin is warm and dry.  Neurological:     Mental Status: She is alert and oriented to person, place, and time.           Latest Ref Rng & Units 12/18/2022   11:57 AM  CMP  Glucose 70 - 99 mg/dL 150   BUN 8 - 23 mg/dL 13   Creatinine 0.44 - 1.00 mg/dL 1.37   Sodium 135 - 145 mmol/L 139   Potassium 3.5 - 5.1 mmol/L 3.5   Chloride 98 - 111 mmol/L 109   CO2 22 - 32 mmol/L 21   Calcium 8.9 - 10.3 mg/dL 9.4   Total Protein 6.5 - 8.1 g/dL 6.7   Total Bilirubin 0.3 - 1.2 mg/dL 0.5   Alkaline Phos 38 - 126 U/L 62   AST 15 - 41 U/L 16   ALT 0 - 44 U/L 11       Latest Ref Rng & Units 12/18/2022   11:57 AM  CBC  WBC 4.0 - 10.5 K/uL 5.0   Hemoglobin 12.0 - 15.0 g/dL 10.9   Hematocrit 36.0 - 46.0 % 33.7   Platelets 150 - 400 K/uL 195     No images are attached to the encounter.  CT CHEST WO CONTRAST  Result Date: 12/12/2022 CLINICAL  DATA:  History of colon cancer, staging evaluation in a 89 year old. * Tracking  Code: BO * EXAM: CT CHEST WITHOUT CONTRAST TECHNIQUE: Multidetector CT imaging of the chest was performed following the standard protocol without IV contrast. RADIATION DOSE REDUCTION: This exam was performed according to the departmental dose-optimization program which includes automated exposure control, adjustment of the mA and/or kV according to patient size and/or use of iterative reconstruction technique. COMPARISON:  CT of the abdomen and pelvis from January 2024. CT of the chest from 2015. FINDINGS: Cardiovascular: Calcified aortic atherosclerotic changes. No aneurysmal dilation of the thoracic aorta. Dilated main pulmonary artery to 3.5 cm. Three-vessel coronary artery disease with mild to moderate cardiomegaly. Mediastinum/Nodes: No thoracic inlet adenopathy. No axillary lymphadenopathy. No mediastinal adenopathy. No gross hilar adenopathy. Lungs/Pleura: Scarring along the medial RIGHT upper lobe. Basilar atelectasis. Small bilateral pleural effusions. Small amount of pleural fluid tracks into the major fissure in the RIGHT chest. Airways are patent. Upper Abdomen: Small hiatal hernia. Vicarious excretion of administered contrast from prior imaging into the gallbladder. No acute upper abdominal findings. Musculoskeletal: No chest wall mass. No acute bone finding or destructive bone process. Deformity of the upper sternum without discrete fracture line or surrounding stranding. IMPRESSION: 1. No evidence of metastatic disease to the chest. 2. Small bilateral pleural effusions. Small amount of pleural fluid tracks into the major fissure in the RIGHT chest. 3. Mild depression of the sternal manubrium may reflect sequela of chronic/prior injury. Correlate with any discomfort in this area or history of prior trauma. Finding is new when compared to more remote imaging from 2015. 4. Dilated main pulmonary artery to 3.5 cm, can be seen in the setting of pulmonary arterial hypertension. 5. Three-vessel coronary  artery disease with mild to moderate cardiomegaly. 6. Small hiatal hernia. 7. Aortic atherosclerosis. Aortic Atherosclerosis (ICD10-I70.0). Electronically Signed   By: Zetta Bills M.D.   On: 12/12/2022 13:51   CT ABDOMEN PELVIS W CONTRAST  Result Date: 12/09/2022 CLINICAL DATA:  Abdominal pain, acute, nonlocalized, diarrhea EXAM: CT ABDOMEN AND PELVIS WITH CONTRAST TECHNIQUE: Multidetector CT imaging of the abdomen and pelvis was performed using the standard protocol following bolus administration of intravenous contrast. RADIATION DOSE REDUCTION: This exam was performed according to the departmental dose-optimization program which includes automated exposure control, adjustment of the mA and/or kV according to patient size and/or use of iterative reconstruction technique. CONTRAST:  52m OMNIPAQUE IOHEXOL 300 MG/ML  SOLN COMPARISON:  05/11/2008 FINDINGS: Lower chest: Extensive multi-vessel coronary artery calcification. Mild cardiomegaly. Central pulmonary arteries are enlarged in keeping with changes of pulmonary arterial hypertension. Visualized lung bases are clear. Small hiatal hernia. Hepatobiliary: Cholelithiasis without pericholecystic inflammatory change. Liver unremarkable. No intra or extrahepatic biliary ductal dilation. Pancreas: Unremarkable Spleen: Stable probable cyst or hemangioma within the splenic hilum. Scattered additional hypodensities within the spleen may relate to the phase of contrast enhancement. Normal size. Adrenals/Urinary Tract: The adrenal glands are unremarkable. Kidneys are normal in position. Mild bilateral renal cortical atrophy. No enhancing intrarenal masses. No hydronephrosis. No intrarenal or ureteral urinary calculi. Scattered calcifications within the right renal hilum likely represent vascular calcifications. The bladder is unremarkable. Stomach/Bowel: There is focal narrowing of the mid transverse colon best seen on axial image # 50/2 and coronal image # 18/5 which  appears new since prior examination and may represent a focal colonic stricture or apple-core mass. There is moderate stool proximal to this, possibly representing a partial large bowel obstruction. The stomach, small bowel, and large bowel are  otherwise unremarkable. Appendix normal. No free intraperitoneal gas or fluid. Vascular/Lymphatic: Moderate aortoiliac atherosclerotic calcification. No aortic aneurysm. Extensive visceral arteriosclerosis with probable hemodynamically significant stenosis of the proximal and mid superior mesenteric artery as well as the renal arteries bilaterally. No pathologic adenopathy within the abdomen and pelvis. Reproductive: Status post hysterectomy. No adnexal masses. Other: Mild nonspecific subcutaneous body wall edema dependently and within the pannus. No abdominal wall hernia. Musculoskeletal: No acute bone abnormality. Degenerative changes are seen within the thoracolumbar spine. No lytic or blastic bone lesion. IMPRESSION: 1. Extensive multi-vessel coronary artery calcification. Mild cardiomegaly. Morphologic changes in keeping with pulmonary arterial hypertension. 2. Cholelithiasis. 3. Focal narrowing of the mid transverse colon, new since prior examination, possibly representing a focal colonic stricture or apple-core mass. Correlation with colonoscopy may be helpful for further evaluation. Moderate stool proximal to this, possibly representing a resultant partial large bowel obstruction. 4. Extensive visceral arteriosclerosis with probable hemodynamically significant stenosis of the proximal and mid superior mesenteric artery as well as the renal arteries bilaterally. If there is clinical evidence of chronic mesenteric ischemia or hemodynamically significant renal artery stenosis, CT arteriography may be helpful for further evaluation. Aortic Atherosclerosis (ICD10-I70.0). Electronically Signed   By: Fidela Salisbury M.D.   On: 12/09/2022 02:36    Assessment and plan-  Patient is a 89 y.o. female referred for new diagnosis of colon cancer  Patient found to have an apple core lesion on CT abdomen.  She underwent a colonoscopy which confirms a fungating partially obstructing mass in the transverse colon that was biopsied and consistent with adenocarcinoma.  CT chest abdomen and pelvis otherwise did not show any evidence of distant metastatic disease.  Discussed with the patient that although she is 89 she still has a good quality of life.  The transverse colon mass is not dealt with it will eventually lead to bowel obstruction.  I therefore recommend proceeding with definitive surgery for colon cancer.  She has been deemed to be candidate for surgery by Dr. Adora Fridge.  She is still thinking about it and will be seeing him in 2 weeks time to discuss surgery.  I am checking a CEA today as well as CBC ferritin and iron studies.  I will see her after her surgery is done.  Given her advanced age even if she has stage III colon cancer she would not be a candidate for any adjuvant chemotherapy.   Thank you for this kind referral and the opportunity to participate in the care of this  Patient   Visit Diagnosis 1. Normocytic anemia     Dr. Randa Evens, MD, MPH [facility] [phone] 12/23/2022              '

## 2023-01-02 ENCOUNTER — Emergency Department
Admission: EM | Admit: 2023-01-02 | Discharge: 2023-01-02 | Disposition: A | Discharge status: Home / Self Care | Payer: 59 | Attending: Emergency Medicine | Admitting: Emergency Medicine

## 2023-01-02 ENCOUNTER — Ambulatory Visit: Payer: 59 | Admitting: Surgery

## 2023-01-02 ENCOUNTER — Other Ambulatory Visit: Payer: Self-pay

## 2023-01-02 DIAGNOSIS — Y92811 Bus as the place of occurrence of the external cause: Secondary | ICD-10-CM | POA: Diagnosis not present

## 2023-01-02 DIAGNOSIS — E1122 Type 2 diabetes mellitus with diabetic chronic kidney disease: Secondary | ICD-10-CM | POA: Insufficient documentation

## 2023-01-02 DIAGNOSIS — I129 Hypertensive chronic kidney disease with stage 1 through stage 4 chronic kidney disease, or unspecified chronic kidney disease: Secondary | ICD-10-CM | POA: Diagnosis not present

## 2023-01-02 DIAGNOSIS — S81812A Laceration without foreign body, left lower leg, initial encounter: Secondary | ICD-10-CM | POA: Insufficient documentation

## 2023-01-02 DIAGNOSIS — N189 Chronic kidney disease, unspecified: Secondary | ICD-10-CM | POA: Diagnosis not present

## 2023-01-02 DIAGNOSIS — W01118A Fall on same level from slipping, tripping and stumbling with subsequent striking against other sharp object, initial encounter: Secondary | ICD-10-CM | POA: Insufficient documentation

## 2023-01-02 MED ORDER — LIDOCAINE HCL (PF) 1 % IJ SOLN
INTRAMUSCULAR | Status: AC
  Administered 2023-01-02: 10 mL
  Filled 2023-01-02: qty 10

## 2023-01-02 MED ORDER — ACETAMINOPHEN 325 MG PO TABS
650.0000 mg | ORAL_TABLET | Freq: Once | ORAL | Status: AC
  Administered 2023-01-02: 650 mg via ORAL
  Filled 2023-01-02: qty 2

## 2023-01-02 NOTE — ED Triage Notes (Signed)
Patient fell going up steps of bus, has laceration to left lower leg; bleeding is controlled.

## 2023-01-02 NOTE — ED Notes (Signed)
Pt ambulated with a steady gait to the bathroom using a walker.

## 2023-01-02 NOTE — ED Provider Notes (Signed)
[facility] Provider Note    Event Date/Time   First MD Initiated Contact with Patient 01/02/23 [phone]     (approximate)   History   Laceration   HPI  Heather Long is a 89 y.o. female with history of hypertension, diabetes, chronic kidney disease, and chronic back pain who presents with a left lower leg laceration acute onset this morning when she lost her grip and fell while getting onto a bus.  The leg hit a metal flange.  The patient denies hitting her head and has no other injuries.  I reviewed the past medical records.  The patient was admitted last month and per the hospitalist discharge summary from 1/24 she was admitted for severe constipation and narrowing or stricture of the transverse colon.   Physical Exam   Triage Vital Signs: ED Triage Vitals  Enc Vitals Group     BP --      Pulse --      Resp 01/02/23 0947 18     Temp 01/02/23 0947 (!) 97.5 F (36.4 C)     Temp Source 01/02/23 0947 Oral     SpO2 01/02/23 0947 99 %     Weight 01/02/23 0946 180 lb (81.6 kg)     Height 01/02/23 0946 4' 11"$  (1.499 m)     Head Circumference --      Peak Flow --      Pain Score 01/02/23 0945 2     Pain Loc --      Pain Edu? --      Excl. in [place]? --     Most recent vital signs: Vitals:   01/02/23 0950 01/02/23 1235  BP: (!) 156/56 (!) 160/57  Pulse: 76 71  Resp:  16  Temp:  (!) 97.5 F (36.4 C)  SpO2:  98%     General: Awake, no distress.  CV:  Good peripheral perfusion.  Resp:  Normal effort.  Abd:  No distention.  Other:  6 cm superficial laceration to anterior left lower leg.  2+ bilateral lower extremity edema.  Full range of motion of bilateral hips and knees.  5/5 motor strength and intact sensation to left ankle and foot.   ED Results / Procedures / Treatments   Labs (all labs ordered are listed, but only abnormal results are displayed) Labs Reviewed - No data to  display   EKG     RADIOLOGY    PROCEDURES:  Critical Care performed: No  ..Laceration Repair  Date/Time: 01/02/2023 3:26 PM  Performed by: Arta Silence, MD Authorized by: Arta Silence, MD   Consent:    Consent obtained:  Verbal   Consent given by:  Patient Universal protocol:    Patient identity confirmed:  Verbally with patient Anesthesia:    Anesthesia method:  Local infiltration   Local anesthetic:  Lidocaine 1% w/o epi Laceration details:    Location:  Leg   Leg location:  L lower leg   Length (cm):  6 Exploration:    Wound extent: fascia not violated and no signs of injury     Contaminated: no   Treatment:    Area cleansed with:  Povidone-iodine   Amount of cleaning:  Extensive   Irrigation solution:  Sterile saline Skin repair:    Repair method:  Sutures   Suture size:  3-0   Suture material:  Nylon   Suture technique:  Simple interrupted Approximation:    Approximation:  Close Repair type:    Repair  type:  Simple Post-procedure details:    Dressing:  Sterile dressing   Procedure completion:  Tolerated well, no immediate complications    MEDICATIONS ORDERED IN ED: Medications  acetaminophen (TYLENOL) tablet 650 mg (650 mg Oral Given 01/02/23 1031)  lidocaine (PF) (XYLOCAINE) 1 % injection (10 mLs  Given 01/02/23 1031)     IMPRESSION / MDM / ASSESSMENT AND PLAN / ED COURSE  I reviewed the triage vital signs and the nursing notes.  89 year old female with PMH as noted above presents with laceration to her left leg after a fall from standing height.  She denies other injuries and exam is otherwise unremarkable.  LLE is neuro/vascular intact.  Differential diagnosis includes, but is not limited to, laceration.  There is no evidence of bony injury.  Patient's presentation is most consistent with acute, uncomplicated illness.  Laceration was repaired without complication.  The patient is ambulatory with her walker and is stable for  discharge.  I counseled her on returning for suture removal and on return precautions; she expressed understanding.   FINAL CLINICAL IMPRESSION(S) / ED DIAGNOSES   Final diagnoses:  Laceration of left lower extremity, initial encounter     Rx / DC Orders   ED Discharge Orders     None        Note:  This document was prepared using Dragon voice recognition software and may include unintentional dictation errors.    Arta Silence, MD 01/02/23 1530

## 2023-01-02 NOTE — Discharge Instructions (Signed)
Keep the laceration clean with a dressing over it.  You may shower but should not submerge the wound under water.  You will need to return in approximately 10 days to have the sutures removed.  You can also do this at an urgent care or doctor's office that is more convenient if needed.  Return to the ER for new, worsening, or persistent severe pain, swelling, bleeding or pus drainage from the wound, redness or swelling going up the leg, fever or chills, or any other new or worsening symptoms that concern you.

## 2023-01-07 ENCOUNTER — Ambulatory Visit (INDEPENDENT_AMBULATORY_CARE_PROVIDER_SITE_OTHER): Payer: 59 | Admitting: Surgery

## 2023-01-07 ENCOUNTER — Other Ambulatory Visit: Payer: Self-pay

## 2023-01-07 ENCOUNTER — Encounter: Payer: Self-pay | Admitting: Surgery

## 2023-01-07 VITALS — BP 164/89 | HR 102 | Temp 97.6°F | Ht 59.0 in | Wt 174.0 lb

## 2023-01-07 DIAGNOSIS — C184 Malignant neoplasm of transverse colon: Secondary | ICD-10-CM | POA: Diagnosis not present

## 2023-01-07 NOTE — Patient Instructions (Signed)
Referral placed to Hospice. Someone will contact you to schedule a consult.  Referral to Oncology. Someone will call to schedule an appointment.   Please see your follow up appointment listed below.

## 2023-01-08 NOTE — Progress Notes (Signed)
Outpatient Surgical Follow Up  01/08/2023  Atziri Ausherman is an 89 y.o. female.   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  She is debilitated and frail and is 89 years old.  On the other hand she is competent.  She is still confused about potential next steps.  I had an extensive discussion with her and also the Education officer, museum.  Unfortunately there is no family available and she is the one taking her own decisions.  Options of major laparotomy with partial colectomy and colostomy were discussed versus palliative measures.  Patient is unsure if she wants to move forward with surgical intervention at this time. Did have a CT and endoscopy that have personally reviewed showing evidence of a mid transverse colon carcinoma and the scope was unable to be advanced to the right colon.  She walks with a walker very slowly.  She is in an assisted living facility.  She is competent. She does not have the greatest cardiovascular reserve but is able to function. Seems to be a little bit confused about potential options.  I again reiterated what I told her in the hospital and she is not ready to make a final determination of commitment. I do think that she is legally blind and she is unable to read clearly due to limited vision  Past Medical History:  Diagnosis Date   Cancer ([place])    Chronic kidney disease    Diabetes mellitus without complication ([place])    Gout    Hyperlipidemia    Hypertension    Lymphedema    Obesity    Osteoporosis    Paget's disease of bone    Spinal stenosis     Past Surgical History:  Procedure Laterality Date   ABDOMINAL HYSTERECTOMY     COLONOSCOPY WITH PROPOFOL N/A 12/11/2022   Procedure: COLONOSCOPY WITH PROPOFOL;  Surgeon: Lin Landsman, MD;  Location: [facility] ENDOSCOPY;  Service: Gastroenterology;  Laterality: N/A;   JOINT  REPLACEMENT  2000, 2006   MASTECTOMY Left 2010   ORIF WRIST FRACTURE  07/2016    Family History  Problem Relation Age of Onset   Diabetes Mother    Cancer Mother    Heart disease Father    Throat cancer Sister    Heart disease Brother    Diabetes Brother    Cancer Sister    Heart disease Brother    Heart disease Brother     Social History:  reports that she quit smoking about 71 years ago. Her smoking use included cigarettes. She has a 0.50 pack-year smoking history. She has never used smokeless tobacco. She reports that she does not drink alcohol and does not use drugs.  Allergies:  Allergies  Allergen Reactions   Ciprofloxacin Nausea And Vomiting   Gluten Meal Other (See Comments)    GI distress   Pioglitazone Other (See Comments)    Leg swelling   Gabapentin Diarrhea, Nausea Only and Other (See Comments)    dizziness    Medications reviewed.    ROS Full ROS performed and is otherwise negative other than what is stated in HPI   BP (!) 164/89   Pulse (!) 102   Temp 97.6 F (36.4 C) (Oral)   Ht 4' 11"$  (1.499 m)   Wt 174 lb (78.9 kg)   SpO2 97%   BMI 35.14  kg/m   Physical Exam  Physical Exam Vitals and nursing note reviewed. Exam conducted with a chaperone present.  Constitutional:      General: She is not in acute distress.    Appearance: She is well-developed. She is obese. She is not ill-appearing.  HENT:     Head: Normocephalic and atraumatic.     Right Ear: Decreased hearing noted.     Left Ear: Decreased hearing noted.     Ears:     Comments: Extremely HOH Eyes:     General: No scleral icterus.    Extraocular Movements: Extraocular movements intact.  Cardiovascular:     Rate and Rhythm: Normal rate and regular rhythm.     Heart sounds: Normal heart sounds. No murmur heard. Pulmonary:     Effort: Pulmonary effort is normal. No respiratory distress.     Breath sounds: Normal breath sounds.  Abdominal:     General: There is no distension.      Palpations: Abdomen is soft.     Tenderness: There is abdominal tenderness in the epigastric area. There is no guarding or rebound.     Hernia: No hernia is present.     Comments: Abdomen is soft, non-distended, no rebound/guarding , no peritonitis Genitourinary:    Comments: Deferred Skin:    General: Skin is warm and dry.     Coloration: Skin is not jaundiced or pale.  Neurological:     General: No focal deficit present.     Mental Status: She is alert and oriented to person, place, and time.  Psychiatric:        Mood and Affect: Mood normal.        Behavior: Behavior normal.   Assessment/Plan: Near obstructing Transverse colon mass c/w adenoca, clinically she is having good bowel function and is not obstructed. She is 60 but has decent performance status. She Is not demented and she is competent. I  discussed potential surgery vs observation and palliative measures. She wishes to think further. She will likely require a left colectomy with an end colostomy and long Hartman's if she decides to moves forward w a surgical intervention.  He understands that she is at high risk for developing complications including readmission, cardiovascular pulmonary collapse and death.  I will see her back in a couple weeks. Will make arrangements for her to see palliative services and to follow-up with oncology as well Please note that I have spent 45 minutes's encounter including personally reviewing images, counseling extensively, placing orders and performing appropriate documentation    Caroleen Hamman, MD [place] [facility]

## 2023-01-17 ENCOUNTER — Telehealth: Payer: Self-pay

## 2023-01-17 DIAGNOSIS — C184 Malignant neoplasm of transverse colon: Secondary | ICD-10-CM

## 2023-01-17 NOTE — Telephone Encounter (Signed)
Spoke with patient's primary care provider and they will take care of this.

## 2023-01-17 NOTE — Progress Notes (Deleted)
Error

## 2023-01-17 NOTE — Telephone Encounter (Signed)
Spoke with patient at this time. New referral placed to Hospice care for consult.

## 2023-01-17 NOTE — Telephone Encounter (Signed)
Patient called very upset. She has trouble seeing and is currently living by herself. She states that she misplaced her medication bottles and does not have any way to request refills as she is out of her medications she was sent home from the hospital with. She is requesting a refill of her medications from the hospital. She was unable to get a hold of her primary care provider as their phone was out of service and she needs these medications as she is completely out. I let her know that I would ask Dr Dahlia Byes if we could do a one time refill of these but that she will need to get any future refills from her primary care provider.

## 2023-01-23 ENCOUNTER — Other Ambulatory Visit: Payer: Self-pay

## 2023-01-23 ENCOUNTER — Telehealth: Payer: Self-pay

## 2023-01-23 ENCOUNTER — Ambulatory Visit: Payer: 59 | Admitting: Surgery

## 2023-01-23 DIAGNOSIS — C184 Malignant neoplasm of transverse colon: Secondary | ICD-10-CM

## 2023-01-23 NOTE — Telephone Encounter (Signed)
Spoke with patient and she has decided to not proceed with surgery. She is receiving hospice care at the facility.

## 2023-04-16 ENCOUNTER — Emergency Department: Payer: Medicare Other

## 2023-04-16 ENCOUNTER — Emergency Department
Admission: EM | Admit: 2023-04-16 | Discharge: 2023-04-17 | Disposition: A | Discharge status: Home / Self Care | Payer: Medicare Other | Attending: Emergency Medicine | Admitting: Emergency Medicine

## 2023-04-16 ENCOUNTER — Other Ambulatory Visit: Payer: Self-pay

## 2023-04-16 DIAGNOSIS — K5669 Other partial intestinal obstruction: Secondary | ICD-10-CM | POA: Insufficient documentation

## 2023-04-16 DIAGNOSIS — Z85038 Personal history of other malignant neoplasm of large intestine: Secondary | ICD-10-CM | POA: Insufficient documentation

## 2023-04-16 DIAGNOSIS — R109 Unspecified abdominal pain: Secondary | ICD-10-CM | POA: Diagnosis present

## 2023-04-16 LAB — URINALYSIS, ROUTINE W REFLEX MICROSCOPIC
Bacteria, UA: NONE SEEN
Bilirubin Urine: NEGATIVE
Glucose, UA: NEGATIVE mg/dL
Ketones, ur: NEGATIVE mg/dL
Nitrite: NEGATIVE
Protein, ur: NEGATIVE mg/dL
Specific Gravity, Urine: 1.01 (ref 1.005–1.030)
pH: 5 (ref 5.0–8.0)

## 2023-04-16 LAB — LIPASE, BLOOD: Lipase: 28 U/L (ref 11–51)

## 2023-04-16 LAB — COMPREHENSIVE METABOLIC PANEL
ALT: 8 U/L (ref 0–44)
AST: 12 U/L — ABNORMAL LOW (ref 15–41)
Albumin: 4.2 g/dL (ref 3.5–5.0)
Alkaline Phosphatase: 71 U/L (ref 38–126)
Anion gap: 12 (ref 5–15)
BUN: 35 mg/dL — ABNORMAL HIGH (ref 8–23)
CO2: 24 mmol/L (ref 22–32)
Calcium: 9.3 mg/dL (ref 8.9–10.3)
Chloride: 100 mmol/L (ref 98–111)
Creatinine, Ser: 1.69 mg/dL — ABNORMAL HIGH (ref 0.44–1.00)
GFR, Estimated: 28 mL/min — ABNORMAL LOW (ref >60)
Glucose, Bld: 267 mg/dL — ABNORMAL HIGH (ref 70–99)
Potassium: 3.7 mmol/L (ref 3.5–5.1)
Sodium: 136 mmol/L (ref 135–145)
Total Bilirubin: 1 mg/dL (ref 0.3–1.2)
Total Protein: 7 g/dL (ref 6.5–8.1)

## 2023-04-16 LAB — CBC
HCT: 36.4 % (ref 36.0–46.0)
Hemoglobin: 11.7 g/dL — ABNORMAL LOW (ref 12.0–15.0)
MCH: 26.7 pg (ref 26.0–34.0)
MCHC: 32.1 g/dL (ref 30.0–36.0)
MCV: 82.9 fL (ref 80.0–100.0)
Platelets: 187 10*3/uL (ref 150–400)
RBC: 4.39 MIL/uL (ref 3.87–5.11)
RDW: 13.2 % (ref 11.5–15.5)
WBC: 6.5 10*3/uL (ref 4.0–10.5)
nRBC: 0 % (ref 0.0–0.2)

## 2023-04-16 MED ORDER — DOCUSATE SODIUM 100 MG PO CAPS: 100.0000 mg | ORAL_CAPSULE | Freq: Two times a day (BID) | ORAL | 0 refills | Status: DC

## 2023-04-16 NOTE — ED Notes (Signed)
Spoke with [facility] for transportation back to [facility] and they refused due to patient not needing a stretcher. No staff at the facility and all contact numbers for the facility have no answer. Patient stating she is unable to locate her purse and Coral Spikes with [facility] is reaching out to crew for its location or possible whereabouts. Patient to remain in the ER until morning when the office opens.

## 2023-04-16 NOTE — ED Provider Notes (Signed)
[facility] Provider Note  Patient Contact: 6:24 PM (approximate)   History   Abdominal Pain   HPI  Heather Long is a 89 y.o. female who presents to the emergency department with intermittent abdominal pain, possible constipation.  Patient provides her history and is somewhat of a poor historian.  Unsure exact start of symptoms.  She reports that the pain is intermittent, when it is there it is quite severe, when it is not she has little to no pain.  She has had no emesis but has had reported constipation for the last 3 days.  She is typically very regular bowel movements and has not had any gas or bowel movement since 3 days ago.  No reported fevers or chills.  Patient denied urinary changes.  She does have a history of colon cancer in the transverse colon.  Currently on hospice care and receiving treatments for colon cancer.     Physical Exam   Triage Vital Signs: ED Triage Vitals  Enc Vitals Group     BP 04/16/23 1520 (!) 172/87     Pulse Rate 04/16/23 1517 84     Resp 04/16/23 1517 18     Temp 04/16/23 1517 98.2 F (36.8 C)     Temp src --      SpO2 04/16/23 1517 95 %     Weight 04/16/23 1519 177 lb (80.3 kg)     Height 04/16/23 1519 4\' 11"  (1.499 m)     Head Circumference --      Peak Flow --      Pain Score 04/16/23 1519 8     Pain Loc --      Pain Edu? --      Excl. in GC? --     Most recent vital signs: Vitals:   04/16/23 1517 04/16/23 1520  BP:  (!) 172/87  Pulse: 84   Resp: 18   Temp: 98.2 F (36.8 C)   SpO2: 95%      General: Alert and in no acute distress.  Cardiovascular:  Good peripheral perfusion Respiratory: Normal respiratory effort without tachypnea or retractions. Lungs CTAB. Good air entry to the bases with no decreased or absent breath sounds. Gastrointestinal: Bowel sounds 4 quadrants. Soft and nontender to palpation. No guarding or rigidity. No palpable masses. No distention. No CVA  tenderness. Musculoskeletal: Full range of motion to all extremities.  Neurologic:  No gross focal neurologic deficits are appreciated.  Skin:   No rash noted Other:   ED Results / Procedures / Treatments   Labs (all labs ordered are listed, but only abnormal results are displayed) Labs Reviewed  COMPREHENSIVE METABOLIC PANEL - Abnormal; Notable for the following components:      Result Value   Glucose, Bld 267 (*)    BUN 35 (*)    Creatinine, Ser 1.69 (*)    AST 12 (*)    GFR, Estimated 28 (*)    All other components within normal limits  CBC - Abnormal; Notable for the following components:   Hemoglobin 11.7 (*)    All other components within normal limits  URINALYSIS, ROUTINE W REFLEX MICROSCOPIC - Abnormal; Notable for the following components:   Color, Urine STRAW (*)    APPearance CLEAR (*)    Hgb urine dipstick SMALL (*)    Leukocytes,Ua TRACE (*)    All other components within normal limits  LIPASE, BLOOD     EKG     RADIOLOGY  I personally  viewed, evaluated, and interpreted these images as part of my medical decision making, as well as reviewing the written report by the radiologist.  ED Provider Interpretation: Findings on CT scan are consistent with a possible partial bowel obstruction secondary to known malignant mass in the colon.  However there is stool on both sides of this mass making it highly unlikely that this is a true bowel obstruction.  CT ABDOMEN PELVIS WO CONTRAST  Result Date: 04/16/2023 CLINICAL DATA:  Acute abdominal pain, history of colon carcinoma with difficulty passing stool EXAM: CT ABDOMEN AND PELVIS WITHOUT CONTRAST TECHNIQUE: Multidetector CT imaging of the abdomen and pelvis was performed following the standard protocol without IV contrast. RADIATION DOSE REDUCTION: This exam was performed according to the departmental dose-optimization program which includes automated exposure control, adjustment of the mA and/or kV according to  patient size and/or use of iterative reconstruction technique. COMPARISON:  12/09/2022 FINDINGS: Lower chest: No acute abnormality. Hepatobiliary: No focal liver abnormality is seen. No gallstones, gallbladder wall thickening, or biliary dilatation. Pancreas: Fatty infiltration of the pancreas is noted. Spleen: Normal in size without focal abnormality. Adrenals/Urinary Tract: Adrenal glands are unremarkable. Kidneys demonstrate scattered vascular calcifications. No definitive calculi are seen. Ureters are within normal limits. The bladder is well distended. Stomach/Bowel: There is again noted a focal area of narrowing in the mid transverse colon similar to that seen on the prior exam. The colon distal to this is predominately decompressed. Considerable amount of fecal material is noted within the ascending colon and proximal transverse colon similar to that seen on the prior exam which may represent at least a partial colonic obstruction. The appendix is within normal limits. Small bowel and stomach are unremarkable with the exception of a small sliding-type hiatal hernia. Vascular/Lymphatic: Aortic atherosclerosis. No enlarged abdominal or pelvic lymph nodes. Reproductive: Status post hysterectomy. No adnexal masses. Other: No abdominal wall hernia or abnormality. No abdominopelvic ascites. Musculoskeletal: Degenerative changes of lumbar spine are seen. IMPRESSION: Persistent apple-core appearing lesion in the proximal to mid transverse colon similar to that seen on the prior exam. A mild backup of fecal material is noted proximal to this which may represent a partial colonic obstruction. Fecal material is noted distal to the lesion however. No other acute abnormality is noted. Electronically Signed   By: Alcide Clever M.D.   On: 04/16/2023 19:00    PROCEDURES:  Critical Care performed: No  Procedures   MEDICATIONS ORDERED IN ED: Medications - No data to display   IMPRESSION / MDM / ASSESSMENT AND PLAN  / ED COURSE  I reviewed the triage vital signs and the nursing notes.                              Clinical Course as of 04/16/23 2119  Tue Apr 16, 2023  1702 Glucose(!): 267 [DK]    Clinical Course User Index [DK] Earlie Server, Student-PA    Differential diagnosis includes, but is not limited to, bowel obstruction, colitis, diverticulitis, UTI  Patient's presentation is most consistent with acute presentation with potential threat to life or bodily function.   Patient's diagnosis is consistent with partial bowel obstruction.  Patient presents emergency department with abdominal pain and constipation for 3 days.  Patient has known malignancy of the colon and transverse colon.  Patient does have intermittent pain from this at baseline.  She is endorsing ongoing pain.  She reports that she has been constipated.  Initially  she said 3 days but then she stated that she was constipated 3 days ago, did have a small bowel movement yesterday.  Imaging and labs are obtained.  Labs are overall reassuring.  Patient does have imaging which reveals unchanged mass in the colon consistent with her known colonic malignancy.  There is stool on both sides though there is slight increase in stool on the proximal side of this mass concerning for a partial bowel obstruction.  I discussed with the patient our findings.  Discussed as the patient is on hospice she has 2 options, she can go home with a stool softener and monitor at home.  Recommend drinking increased fluids and limiting ingestion of solids.  I also discussed admission for observation of this.  Given her age, comorbidities it is already been determined that patient is not a good candidate for surgical intervention of this colonic mass.  Due to this, patient states that she would prefer to go home with stool softener.  If she has increased pain, still unable to pass stool she states that she will return but at this time she would like to proceed home.  I feel  this is reasonable as she is already on hospice care..  I did discuss talking with any family members about her findings, recommendations this evening.  Patient states that she does not have a good relationship with her family and does not wish to discuss these findings with them at this time.  Still softener prescribed at this time.  Patient is given ED precautions to return to the ED for any worsening or new symptoms.     FINAL CLINICAL IMPRESSION(S) / ED DIAGNOSES   Final diagnoses:  Other partial intestinal obstruction (HCC)     Rx / DC Orders   ED Discharge Orders          Ordered    docusate sodium (COLACE) 100 MG capsule  2 times daily        04/16/23 2117             Note:  This document was prepared using Dragon voice recognition software and may include unintentional dictation errors.   Lanette Hampshire 04/16/23 2119    Concha Se, MD 04/20/23 [phone]

## 2023-04-16 NOTE — ED Notes (Signed)
Patient placed on bedpan.

## 2023-04-16 NOTE — ED Notes (Signed)
Pt provided a brief and repositioned in the bed. Call bell within reach.

## 2023-04-16 NOTE — ED Triage Notes (Signed)
Pt to ED [facility] from [facility] for abd pain a few days. No BM. +nausea.

## 2023-04-16 NOTE — Discharge Instructions (Addendum)
There is a partial bowel obstruction in your colon from your malignant mass.  There is still stool passing through this area.  At this time we recommend stool softener, plenty of fluids and limiting your solid intake of food.  If you have increasing pain, inability to pass any more stool will you may return to the emergency department.  However this is likely not to be operable and treatment of choice is bowel rest.

## 2023-04-16 NOTE — ED Notes (Signed)
Called [facility] for pt transport back to home

## 2023-04-17 NOTE — ED Notes (Signed)
This RN received report from night shift RN. Pt. Awaiting morning to call for ride. This RN will attempt to call pt's friend at [phone], and will call report to facility when pt. Has secured ride.

## 2023-04-17 NOTE — ED Notes (Signed)
Report received, this RN now assuming care.  

## 2023-04-17 NOTE — ED Notes (Signed)
This RN called Sheryl at [phone] and left message. This RN will attempt to call [facility] for transport.

## 2023-04-17 NOTE — ED Notes (Signed)
Pt sitting comfortably in a recliner in 5H, given water and denies any other needs at this time.

## 2023-04-17 NOTE — ED Notes (Signed)
Pt. Resting comfortably in recliner. Eyes closed, chest rise and fall.

## 2023-04-17 NOTE — ED Notes (Signed)
Pt transitioned to a recliner to promote comfort.

## 2023-04-17 NOTE — ED Notes (Addendum)
Pt provided a phone number of a family friend Tora Duck who could potentially provide a ride in the AM. [phone]

## 2023-04-17 NOTE — ED Notes (Signed)
This RN contacted [facility], spoke with [facility], states they are independent living, and report does not need to be called. Notified [place] that pt. Would be returning around 1400.

## 2023-04-17 NOTE — ED Notes (Signed)
Pt. Sleeping in recliner, provided pillow for comfort. Pt. Denies any further need.

## 2023-04-17 NOTE — ED Notes (Signed)
This RN contacted [facility] [employer], she states she can come pick up pt. At approx 1330.

## 2023-04-17 NOTE — ED Notes (Signed)
Pt assisted with bed pan

## 2023-04-17 NOTE — ED Notes (Signed)
Pt. Provided orange juice per request

## 2023-06-04 ENCOUNTER — Ambulatory Visit (INDEPENDENT_AMBULATORY_CARE_PROVIDER_SITE_OTHER): Payer: 59 | Admitting: Vascular Surgery

## 2023-06-20 DEATH — deceased
